# Patient Record
Sex: Male | Born: 1950
Health system: Southern US, Community
[De-identification: ages and names within clinical notes are randomized; demographics above are authoritative.]

## PROBLEM LIST (undated history)

## (undated) DIAGNOSIS — C7931 Secondary malignant neoplasm of brain: Secondary | ICD-10-CM

## (undated) DIAGNOSIS — K219 Gastro-esophageal reflux disease without esophagitis: Secondary | ICD-10-CM

## (undated) DIAGNOSIS — J189 Pneumonia, unspecified organism: Secondary | ICD-10-CM

## (undated) DIAGNOSIS — C642 Malignant neoplasm of left kidney, except renal pelvis: Secondary | ICD-10-CM

## (undated) HISTORY — PX: INGUINAL HERNIA REPAIR: SUR1180

## (undated) HISTORY — PX: COLONOSCOPY: SHX174

---

## 2008-02-03 HISTORY — PX: FOOT TENDON SURGERY: SHX958

## 2009-01-11 ENCOUNTER — Ambulatory Visit (HOSPITAL_COMMUNITY): Admission: RE | Admit: 2009-01-11 | Discharge: 2009-01-11 | Payer: Self-pay | Admitting: Orthopedic Surgery

## 2010-05-06 LAB — COMPREHENSIVE METABOLIC PANEL
Alkaline Phosphatase: 58 U/L (ref 39–117)
BUN: 22 mg/dL (ref 6–23)
CO2: 31 mEq/L (ref 19–32)
Chloride: 102 mEq/L (ref 96–112)
Glucose, Bld: 98 mg/dL (ref 70–99)
Potassium: 4.2 mEq/L (ref 3.5–5.1)
Total Bilirubin: 0.6 mg/dL (ref 0.3–1.2)

## 2010-05-06 LAB — CBC
Hemoglobin: 13.8 g/dL (ref 13.0–17.0)
RBC: 4.4 MIL/uL (ref 4.22–5.81)
WBC: 7.8 10*3/uL (ref 4.0–10.5)

## 2010-05-06 LAB — PROTIME-INR: Prothrombin Time: 13.1 seconds (ref 11.6–15.2)

## 2012-12-26 ENCOUNTER — Other Ambulatory Visit: Payer: Self-pay | Admitting: Family Medicine

## 2012-12-26 ENCOUNTER — Ambulatory Visit
Admission: RE | Admit: 2012-12-26 | Discharge: 2012-12-26 | Disposition: A | Discharge status: Home / Self Care | Payer: Managed Care, Other (non HMO) | Source: Ambulatory Visit | Attending: Family Medicine | Admitting: Family Medicine

## 2012-12-26 DIAGNOSIS — M25522 Pain in left elbow: Secondary | ICD-10-CM

## 2014-04-03 HISTORY — PX: KIDNEY SURGERY: SHX687

## 2014-04-20 ENCOUNTER — Other Ambulatory Visit: Payer: Self-pay | Admitting: Gastroenterology

## 2014-04-20 DIAGNOSIS — D509 Iron deficiency anemia, unspecified: Secondary | ICD-10-CM

## 2014-04-20 DIAGNOSIS — R109 Unspecified abdominal pain: Secondary | ICD-10-CM

## 2014-04-23 ENCOUNTER — Ambulatory Visit
Admission: RE | Admit: 2014-04-23 | Discharge: 2014-04-23 | Disposition: A | Discharge status: Home / Self Care | Payer: Managed Care, Other (non HMO) | Source: Ambulatory Visit | Attending: Gastroenterology | Admitting: Gastroenterology

## 2014-04-23 DIAGNOSIS — D509 Iron deficiency anemia, unspecified: Secondary | ICD-10-CM

## 2014-04-23 DIAGNOSIS — R109 Unspecified abdominal pain: Secondary | ICD-10-CM

## 2014-04-23 MED ORDER — IOPAMIDOL (ISOVUE-300) INJECTION 61%
100.0000 mL | Freq: Once | INTRAVENOUS | Status: AC | PRN
  Administered 2014-04-23: 100 mL via INTRAVENOUS

## 2014-05-01 ENCOUNTER — Other Ambulatory Visit: Payer: Self-pay | Admitting: Radiology

## 2014-05-01 ENCOUNTER — Telehealth: Payer: Self-pay | Admitting: Oncology

## 2014-05-01 ENCOUNTER — Other Ambulatory Visit: Payer: Self-pay | Admitting: Urology

## 2014-05-01 DIAGNOSIS — N2889 Other specified disorders of kidney and ureter: Secondary | ICD-10-CM

## 2014-05-01 NOTE — Telephone Encounter (Signed)
called pt and scheduled appt with Dr. Alen Blew. TG  Shadad 05/11/14 1:30pm  Dx: Renal Neoplasm Referring:  Dr. Jeffie Pollock

## 2014-05-03 ENCOUNTER — Other Ambulatory Visit: Payer: Self-pay | Admitting: Radiology

## 2014-05-04 ENCOUNTER — Encounter (HOSPITAL_COMMUNITY): Payer: Self-pay

## 2014-05-04 ENCOUNTER — Ambulatory Visit (HOSPITAL_COMMUNITY)
Admission: RE | Admit: 2014-05-04 | Discharge: 2014-05-04 | Disposition: A | Discharge status: Home / Self Care | Payer: Managed Care, Other (non HMO) | Source: Ambulatory Visit | Attending: Urology | Admitting: Urology

## 2014-05-04 DIAGNOSIS — N2889 Other specified disorders of kidney and ureter: Secondary | ICD-10-CM

## 2014-05-04 LAB — CBC
HEMATOCRIT: 31.9 % — AB (ref 39.0–52.0)
HEMOGLOBIN: 9.4 g/dL — AB (ref 13.0–17.0)
MCH: 21.2 pg — ABNORMAL LOW (ref 26.0–34.0)
MCHC: 29.5 g/dL — ABNORMAL LOW (ref 30.0–36.0)
MCV: 71.8 fL — ABNORMAL LOW (ref 78.0–100.0)
Platelets: 299 10*3/uL (ref 150–400)
RBC: 4.44 MIL/uL (ref 4.22–5.81)
RDW: 18.2 % — ABNORMAL HIGH (ref 11.5–15.5)
WBC: 5.9 10*3/uL (ref 4.0–10.5)

## 2014-05-04 LAB — APTT: APTT: 38 s — AB (ref 24–37)

## 2014-05-04 LAB — PROTIME-INR
INR: 1.16 (ref 0.00–1.49)
PROTHROMBIN TIME: 14.9 s (ref 11.6–15.2)

## 2014-05-04 MED ORDER — LIDOCAINE HCL (PF) 1 % IJ SOLN
INTRAMUSCULAR | Status: AC
  Filled 2014-05-04: qty 10

## 2014-05-04 MED ORDER — GELATIN ABSORBABLE 12-7 MM EX MISC
CUTANEOUS | Status: AC
  Filled 2014-05-04: qty 1

## 2014-05-04 MED ORDER — FENTANYL CITRATE 0.05 MG/ML IJ SOLN
INTRAMUSCULAR | Status: AC
  Filled 2014-05-04: qty 2

## 2014-05-04 MED ORDER — FENTANYL CITRATE 0.05 MG/ML IJ SOLN
INTRAMUSCULAR | Status: AC | PRN
  Administered 2014-05-04 (×2): 50 ug via INTRAVENOUS

## 2014-05-04 MED ORDER — MIDAZOLAM HCL 2 MG/2ML IJ SOLN
INTRAMUSCULAR | Status: AC
  Filled 2014-05-04: qty 4

## 2014-05-04 MED ORDER — SODIUM CHLORIDE 0.9 % IV SOLN
Freq: Once | INTRAVENOUS | Status: AC
  Administered 2014-05-04: 09:00:00 via INTRAVENOUS

## 2014-05-04 MED ORDER — MIDAZOLAM HCL 2 MG/2ML IJ SOLN
INTRAMUSCULAR | Status: AC | PRN
  Administered 2014-05-04 (×2): 1 mg via INTRAVENOUS

## 2014-05-04 NOTE — H&P (Signed)
Chief Complaint: L renal mass  Referring Physician(s): Wrenn,John  History of Present Illness: Jacob Beck is a 64 y.o. male   Pt has had groin pain x 1 mo CT reveals L renal mass; retroperitoneal lymphadenopathy and B lung nodules Now scheduled for L renal mass biopsy  Past Medical History  Diagnosis Date  . Renal mass     History reviewed. No pertinent past surgical history.  Allergies: Review of patient's allergies indicates no known allergies.  Medications: Prior to Admission medications   Medication Sig Start Date End Date Taking? Authorizing Provider  Fiber, Guar Gum, CHEW Chew 1 tablet by mouth daily.   Yes Historical Provider, MD  Multiple Vitamins-Minerals (MULTIVITAMIN WITH MINERALS) tablet Take 1 tablet by mouth daily.   Yes Historical Provider, MD     History reviewed. No pertinent family history.  History   Social History  . Marital Status: Married    Spouse Name: N/A  . Number of Children: N/A  . Years of Education: N/A   Social History Main Topics  . Smoking status: Former Research scientist (life sciences)  . Smokeless tobacco: Not on file  . Alcohol Use: Not on file  . Drug Use: Not on file  . Sexual Activity: Not on file   Other Topics Concern  . None   Social History Narrative  . None    Review of Systems: A 12 point ROS discussed and pertinent positives are indicated in the HPI above.  All other systems are negative.  Review of Systems  Constitutional: Positive for appetite change and unexpected weight change. Negative for fever and activity change.  Respiratory: Negative for cough, chest tightness and shortness of breath.   Cardiovascular: Negative for chest pain.  Gastrointestinal: Positive for abdominal pain.  Musculoskeletal: Negative for back pain.  Neurological: Negative for weakness.  Psychiatric/Behavioral: Negative for behavioral problems and confusion.    Vital Signs: BP 138/70 mmHg  Pulse 95  Temp(Src) 97.6 F (36.4 C) (Oral)  Resp  18  Ht 6\' 2"  (1.88 m)  Wt 79.833 kg (176 lb)  BMI 22.59 kg/m2  SpO2 99%  Physical Exam  Constitutional: He is oriented to person, place, and time. He appears well-developed.  Cardiovascular: Normal rate, regular rhythm and normal heart sounds.   No murmur heard. Pulmonary/Chest: Effort normal and breath sounds normal. He has no wheezes.  Abdominal: Soft. Bowel sounds are normal. There is no tenderness.  Musculoskeletal: Normal range of motion.  Neurological: He is alert and oriented to person, place, and time.  Skin: Skin is warm and dry.  Psychiatric: He has a normal mood and affect. His behavior is normal. Judgment and thought content normal.  Nursing note and vitals reviewed.   Mallampati Score:  MD Evaluation Airway: WNL Heart: WNL Abdomen: WNL Chest/ Lungs: WNL ASA  Classification: 3 Mallampati/Airway Score: One  Imaging: Ct Abdomen Pelvis W Contrast  04/23/2014   CLINICAL DATA:  64 year old male with generalized abdominal pain for the past 3 weeks, presenting with anemia.  EXAM: CT ABDOMEN AND PELVIS WITH CONTRAST  TECHNIQUE: Multidetector CT imaging of the abdomen and pelvis was performed using the standard protocol following bolus administration of intravenous contrast.  CONTRAST:  100 mL of Isovue 300.  COMPARISON:  No priors.  FINDINGS: Lower chest: Small pulmonary nodules in the lung bases bilaterally, largest of which measures 6 mm in the left lower lobe (image 4 of series 4).  Hepatobiliary: No cystic or solid hepatic lesions. No intra or extrahepatic biliary ductal  dilatation. Gallbladder is normal in appearance.  Pancreas: Unremarkable.  Spleen: The spleen itself is normal in appearance. There is obliteration of the fat plane between the spleen and the large mass extending off the upper pole of the left kidney, without definite direct invasion into the spleen.  Adrenals/Urinary Tract: Very large mass extending off the upper pole of the left kidney estimated to measure  approximately 15.0 x 14.1 x 9.9 cm. This completely obscures the left adrenal gland, and appears to protrude outside Gerota's fascia occupying much of the upper left retroperitoneum, coming in direct contact with the spleen and the medial/undersurface of the stomach, without definite gastric invasion. There is extensive neovascularity associated with this lesion, with multiple venous collaterals extending to the IVC. Additionally, there appears to be tumor thrombus in the proximal aspect of the left renal vein, best appreciated on image 35 of series 3 and coronal image 54 of series 601. This tumor thrombus does not appear to cross the midline, and the IVC is free of thrombus. Right kidney and right adrenal gland are normal in appearance. No hydroureteronephrosis. Urinary bladder is normal in appearance.  Stomach/Bowel: Extensive mass effect upon the proximal stomach from the large left renal mass. The stomach itself is otherwise grossly normal in appearance, although there is loss of the intervening fat plane between the mass and the stomach such that some early invasion of the stomach is not entirely excluded. No pathologic dilatation of small bowel or colon.  Vascular/Lymphatic: Mild atherosclerosis throughout the abdominal and pelvic vasculature. Extensive neovascularity throughout the left retroperitoneum, as above. Multiple borderline enlarged and mildly enlarged retroperitoneal lymph nodes, most pronounced in the left para-aortic nodal stations, measuring up to 16 mm in short axis.  Reproductive: Prostate gland and seminal vesicles are unremarkable in appearance.  Other: No significant volume of ascites.  No pneumoperitoneum.  Musculoskeletal: There are no aggressive appearing lytic or blastic lesions noted in the visualized portions of the skeleton.  IMPRESSION: 1. 15.0 x 14.1 x 9.9 cm left renal mass extending exophytically off the upper pole of the left kidney, with extension through Gerota's fascia, early  proximal left renal vein invasion, retroperitoneal lymphadenopathy, and multiple small pulmonary nodules in the lung bases bilaterally, concerning for probable stage IV renal cell carcinoma. 2. Additional incidental findings, as above. These results were called by telephone at the time of interpretation on 04/23/2014 at 4:37 pm to Dr. Clarene Essex, who verbally acknowledged these results.   Electronically Signed   By: Vinnie Langton M.D.   On: 04/23/2014 16:38    Labs:  CBC: No results for input(s): WBC, HGB, HCT, PLT in the last 8760 hours.  COAGS: No results for input(s): INR, APTT in the last 8760 hours.  BMP: No results for input(s): NA, K, CL, CO2, GLUCOSE, BUN, CALCIUM, CREATININE, GFRNONAA, GFRAA in the last 8760 hours.  Invalid input(s): CMP  LIVER FUNCTION TESTS: No results for input(s): BILITOT, AST, ALT, ALKPHOS, PROT, ALBUMIN in the last 8760 hours.  TUMOR MARKERS: No results for input(s): AFPTM, CEA, CA199, CHROMGRNA in the last 8760 hours.  Assessment and Plan:  L renal mass B lung nodules and RP LAN Scheduled for biopsy of renal mass in IR Risks and Benefits discussed with the patient including, but not limited to bleeding, infection, damage to adjacent structures or low yield requiring additional tests. All of the patient's questions were answered, patient is agreeable to proceed. Consent signed and in chart.  Thank you for this interesting consult.  I greatly enjoyed meeting CRAIGE PATEL and look forward to participating in their care.  Signed: Pavielle Biggar A 05/04/2014, 9:27 AM   I spent a total of  20 Minutes   in face to face in clinical consultation, greater than 50% of which was counseling/coordinating care for L renal mass bx

## 2014-05-04 NOTE — Discharge Instructions (Signed)
Kidney Biopsy A biopsy is a test that involves collecting small pieces of tissue, usually with a needle. The tissue is then examined under a microscope. A kidney biopsy can help a health care provider make a diagnosis and determine the best course of treatment. Your health care provider may recommend a kidney biopsy if you have any of the following conditions:  Blood in your urine (hematuria).  Excessive protein in your urine (proteinuria).  Impaired kidney function that causes excessive waste products in your blood. A specialist will look at the kidney tissue samples to check for unusual deposits, scarring, or infecting organisms that would explain your condition. If you have a kidney transplant, a biopsy can also help explain why a transplanted kidney is not working properly. Talk with your health care provider about what information might be learned from the biopsy and the risks involved. This can help you make a decision about whether a biopsy is worthwhile in your case. LET [facility] CARE PROVIDER KNOW ABOUT:  Any allergies you have.  All medicines you are taking, including vitamins, herbs, eye drops, creams, and over-the-counter medicines.  Previous problems you or members of your family have had with the use of anesthetics.  Any blood disorders you have.  Previous surgeries you have had.  Medical conditions you have. RISKS AND COMPLICATIONS Generally, a kidney biopsy is a safe procedure. However, as with any procedure, complications can occur. Possible complications include:  Infection.  Bleeding. BEFORE THE PROCEDURE  Make sure you understand the need for a biopsy.  Do not eat or drink for 8 hours before the test or as directed by your health care provider.  You will need to give blood and urine samples before the biopsy. This is to make sure you do not have a condition where you should not have a biopsy. PROCEDURE Kidney biopsies are usually done in a hospital. During  the procedure, you may be fully awake with light sedation, or you may be asleep under general anesthesia. The entire procedure usually takes an hour.  You will lie on your stomach to position the kidneys near the surface of your back. If you have a transplanted kidney, you will lie on your back.  The health care provider will inject a local painkiller. For a through-the-skin (percutaneous) biopsy, the health care provider will use a locating needle and X-ray or ultrasound equipment to find the right spot.  A collecting needle will be used to gather the tissue. If you are awake, you will be asked to hold your breath as the needle is inserted and collects the tissue. Each insertion and collection lasts about 30 seconds or a little longer. You will be told when to exhale. AFTER THE PROCEDURE  You will lie on your back for 12 to 24 hours. If you have a transplanted kidney, you may not have to lie on your back. During this time, your back will probably feel sore. You may stay in the hospital overnight after the procedure so that staff can check your condition.  You may notice some blood in your urine for 24 hours after the test. To detect any problems, your health care providers will:  Monitor your blood pressure and pulse.  Take blood samples to measure the amount of red blood cells.  Examine the urine that you pass.  On rare occasions when bleeding is excessive, it may be necessary to replace lost blood with a transfusion.  It is your responsibility to obtain your test results.  Ask the lab or department performing the test when and how you will get your results. FOR MORE INFORMATION  American [employer]: https://mathis.com/  National Kidney Foundation: www.kidney.org  National Kidney and Urologic Diseases Information Clearinghouse: http://kidney.AmenCredit.is Document Released: 11/30/2003 Document Revised: 11/09/2012 Document Reviewed: 07/25/2012 [facility] Patient Information 2015 [place],  [place]. This information is not intended to replace advice given to you by your health care provider. Make sure you discuss any questions you have with your health care provider.

## 2014-05-04 NOTE — Procedures (Signed)
L renal mass Bx 18 g core times three No comp

## 2014-05-04 NOTE — Sedation Documentation (Signed)
Bandaid L back area

## 2014-05-11 ENCOUNTER — Other Ambulatory Visit: Payer: Managed Care, Other (non HMO)

## 2014-05-11 ENCOUNTER — Ambulatory Visit: Payer: Managed Care, Other (non HMO)

## 2014-05-11 ENCOUNTER — Telehealth: Payer: Self-pay | Admitting: *Deleted

## 2014-05-11 ENCOUNTER — Ambulatory Visit (HOSPITAL_BASED_OUTPATIENT_CLINIC_OR_DEPARTMENT_OTHER): Payer: Managed Care, Other (non HMO) | Admitting: Oncology

## 2014-05-11 ENCOUNTER — Encounter: Payer: Self-pay | Admitting: Oncology

## 2014-05-11 VITALS — BP 137/62 | HR 94 | Temp 97.6°F | Resp 18 | Ht 74.0 in | Wt 176.2 lb

## 2014-05-11 DIAGNOSIS — D649 Anemia, unspecified: Secondary | ICD-10-CM | POA: Diagnosis not present

## 2014-05-11 DIAGNOSIS — R1013 Epigastric pain: Secondary | ICD-10-CM | POA: Diagnosis not present

## 2014-05-11 DIAGNOSIS — N2889 Other specified disorders of kidney and ureter: Secondary | ICD-10-CM

## 2014-05-11 DIAGNOSIS — C642 Malignant neoplasm of left kidney, except renal pelvis: Secondary | ICD-10-CM

## 2014-05-11 DIAGNOSIS — R1084 Generalized abdominal pain: Secondary | ICD-10-CM | POA: Diagnosis not present

## 2014-05-11 MED ORDER — HYDROCODONE-ACETAMINOPHEN 5-325 MG PO TABS: 1.0000 | ORAL_TABLET | Freq: Four times a day (QID) | ORAL | Status: DC | PRN

## 2014-05-11 NOTE — Telephone Encounter (Signed)
PT.'S WIFE RETURNED CALL. INFORMATION CONCERNING THE REFERRAL TO [facility] WAS GIVEN TO PT.'S WIFE. SHE WAS VERY CONCERNED THAT PT.S APPOINTMENT WAS NOT UNTIL MAY. SHE ASKED TO SEE IF THE APPOINTMENT COULD BE EARLIER. THIS NOTE WAS ROUTED TO STACEY CAMP,RN.

## 2014-05-11 NOTE — Telephone Encounter (Signed)
Per Dr. Alen Blew, this office made a referral to Dr. Vito Berger at [facility]. Phone # is [phone]). Appointment was made for May 4th, 2016 @ 1:30 pm. [facility] to mail him a new patient packet. Patient's cell phone is not set up to receive voice mails so, patient is not aware of this appointment. If he needs to cancel this appointment, he needs to call [phone] and reschedule. [facility] will fax all records to [phone]. Imaging studies will need to be put on a disk and Fed Ex. Also, need actual slides from the biopsy. This will be Fed Ex'd also. Medical Records is taking care of this.

## 2014-05-11 NOTE — Consult Note (Signed)
Reason for Referral: Renal cell carcinoma.   HPI: 64 year old gentleman currently of [place] where he lived the majority of his life. He is a rather healthy gentleman without any significant comorbid conditions and doesn't really take any routine medications. 3 weeks ago he started developing left-sided flank pain, midepigastric abdominal pain and suprapubic pain. Previously she with nausea but really no vomiting. He also reported close to 20 pound weight loss. His pain is rather persistent and a nagging and radiating into the groin. He subsequently underwent a CT scan on 04/23/2014 which revealed a 15 cm mass arising from the upper pole of his left kidney extending into the retroperitoneum. This is associated with adenopathy and the proximal vein thrombosis. He was evaluated by Dr. Jeffie Pollock and referred her for a biopsy that was done on 05/04/2014. 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 CT scan of the chest showed a few pulmonary nodule but predominantly subcentimeter nodules. There is no lymphadenopathy noted in the chest. Patient referred to me for evaluation regarding these findings. He is a very functional individual and have been working up to this point and currently on short-term disability. He is able to drive and attends to activities of daily living.  Clinically, he is symptomatic from this mass. He does continue to report abdominal pain occasional nausea but no vomiting. He has had also some fevers and chills at nighttime. He is appetite is poor but is able to eat and keep food down. He does not report any headaches, blurry vision, double vision, syncope or seizures. He does not report any sweats and does endorse weight loss and appetite changes. He does not report any chest pain, palpitation, orthopnea, leg edema. He does not report any cough, hemoptysis, hematemesis. He does not report any early satiety or abdominal distention. He  does report flank pain but no hematuria, dysuria, frequency, urgency or hesitancy. He does not report any skeletal pains to his back pain shoulder pain or hip pain. He does not report any lymphadenopathy or petechiae. Remainder of review of systems unremarkable.   Past Medical History  Diagnosis Date  . Renal mass   :  No past surgical history on file.:   Current outpatient prescriptions:  .  Fiber, Guar Gum, CHEW, Chew 1 tablet by mouth daily., Disp: , Rfl:  .  ibuprofen (ADVIL,MOTRIN) 200 MG tablet, Take 200 mg by mouth at bedtime., Disp: , Rfl:  .  Multiple Vitamins-Minerals (MULTIVITAMIN WITH MINERALS) tablet, Take 1 tablet by mouth daily., Disp: , Rfl:  .  HYDROcodone-acetaminophen (NORCO/VICODIN) 5-325 MG per tablet, Take 1 tablet by mouth every 6 (six) hours as needed for moderate pain., Disp: 30 tablet, Rfl: 0:  No Known Allergies:  No family history on file.:  History   Social History  . Marital Status: Married    Spouse Name: N/A  . Number of Children: N/A  . Years of Education: N/A   Occupational History  . Not on file.   Social History Main Topics  . Smoking status: Former Research scientist (life sciences)  . Smokeless tobacco: Not on file  . Alcohol Use: Not on file  . Drug Use: Not on file  . Sexual Activity: Not on file   Other Topics Concern  . Not on file   Social History Narrative  . No narrative on file  :  Pertinent items are noted in HPI.  Exam: ECOG 0 Blood pressure 137/62, pulse 94, temperature 97.6 F (36.4 C), temperature  source Oral, resp. rate 18, height 6\' 2"  (1.88 m), weight 176 lb 3.2 oz (79.924 kg), SpO2 100 %. General appearance: alert and cooperative Head: Normocephalic, without obvious abnormality Throat: lips, mucosa, and tongue normal; teeth and gums normal Neck: no adenopathy Back: negative Resp: clear to auscultation bilaterally Chest wall: no tenderness Cardio: regular rate and rhythm, S1, S2 normal, no murmur, click, rub or gallop GI: soft,  non-tender; bowel sounds normal; no masses,  no organomegaly Extremities: extremities normal, atraumatic, no cyanosis or edema Pulses: 2+ and symmetric Skin: Skin color, texture, turgor normal. No rashes or lesions Lymph nodes: Cervical, supraclavicular, and axillary nodes normal.    Ct Abdomen Pelvis W Contrast  04/23/2014   CLINICAL DATA:  64 year old male with generalized abdominal pain for the past 3 weeks, presenting with anemia.  EXAM: CT ABDOMEN AND PELVIS WITH CONTRAST  TECHNIQUE: Multidetector CT imaging of the abdomen and pelvis was performed using the standard protocol following bolus administration of intravenous contrast.  CONTRAST:  100 mL of Isovue 300.  COMPARISON:  No priors.  FINDINGS: Lower chest: Small pulmonary nodules in the lung bases bilaterally, largest of which measures 6 mm in the left lower lobe (image 4 of series 4).  Hepatobiliary: No cystic or solid hepatic lesions. No intra or extrahepatic biliary ductal dilatation. Gallbladder is normal in appearance.  Pancreas: Unremarkable.  Spleen: The spleen itself is normal in appearance. There is obliteration of the fat plane between the spleen and the large mass extending off the upper pole of the left kidney, without definite direct invasion into the spleen.  Adrenals/Urinary Tract: Very large mass extending off the upper pole of the left kidney estimated to measure approximately 15.0 x 14.1 x 9.9 cm. This completely obscures the left adrenal gland, and appears to protrude outside Gerota's fascia occupying much of the upper left retroperitoneum, coming in direct contact with the spleen and the medial/undersurface of the stomach, without definite gastric invasion. There is extensive neovascularity associated with this lesion, with multiple venous collaterals extending to the IVC. Additionally, there appears to be tumor thrombus in the proximal aspect of the left renal vein, best appreciated on image 35 of series 3 and coronal image 54  of series 601. This tumor thrombus does not appear to cross the midline, and the IVC is free of thrombus. Right kidney and right adrenal gland are normal in appearance. No hydroureteronephrosis. Urinary bladder is normal in appearance.  Stomach/Bowel: Extensive mass effect upon the proximal stomach from the large left renal mass. The stomach itself is otherwise grossly normal in appearance, although there is loss of the intervening fat plane between the mass and the stomach such that some early invasion of the stomach is not entirely excluded. No pathologic dilatation of small bowel or colon.  Vascular/Lymphatic: Mild atherosclerosis throughout the abdominal and pelvic vasculature. Extensive neovascularity throughout the left retroperitoneum, as above. Multiple borderline enlarged and mildly enlarged retroperitoneal lymph nodes, most pronounced in the left para-aortic nodal stations, measuring up to 16 mm in short axis.  Reproductive: Prostate gland and seminal vesicles are unremarkable in appearance.  Other: No significant volume of ascites.  No pneumoperitoneum.  Musculoskeletal: There are no aggressive appearing lytic or blastic lesions noted in the visualized portions of the skeleton.  IMPRESSION: 1. 15.0 x 14.1 x 9.9 cm left renal mass extending exophytically off the upper pole of the left kidney, with extension through Gerota's fascia, early proximal left renal vein invasion, retroperitoneal lymphadenopathy, and multiple small pulmonary nodules  in the lung bases bilaterally, concerning for probable stage IV renal cell carcinoma. 2. Additional incidental findings, as above. These results were called by telephone at the time of interpretation on 04/23/2014 at 4:37 pm to Dr. Clarene Essex, who verbally acknowledged these results.   Electronically Signed   By: Vinnie Langton M.D.   On: 04/23/2014 16:38      Assessment and Plan:    64 year old gentleman with the following issues:  1. Renal cell carcinoma  presented with a large mass measuring 15.0 x 14.1 x 9.9 cm left renal mass extending off the upper pole of the left kidney into the retroperitoneum. He has also evidence of lymphadenopathy and small pulmonary nodules. He presented with abdominal pain and 20 pound weight loss. This is a biopsy proven to be renal cell carcinoma with a biopsy done on 05/04/2014.  The natural course of this disease was discussed extensively with the patient and his wife. Options of treatments were also reviewed. These options would include a neoadjuvant systemic therapy in the form of oral VGEF inhibitors, mTOR inhibitors or immunotherapy. The idea is to treat him systemically in attempt to shrink his primary tumor and if he has an excellent response, a salvage nephrectomy will be attempted later.  Alternatively, and I plan to nephrectomy was also considered. It certainly would be a major operation especially with left renal vein invasion as well as the large in nature of his mass.  Both approaches were discussed extensively and risks and benefits of systemic therapy were reviewed. I fear that if we start with systemic therapy and he has a poor response, the window of opportunity to remove this tumor might close rather rapidly. I also feel that the majority of his symptoms are arising from this tumor and removing this tumor as soon as possible might palliate his symptoms and improve his chances moving forward.  After discussing these both approaches he elected to proceed with surgical approach. He has expressed interest in receiving a surgical opinion at a tertiary care center and I'll make the appropriate referral for him based on his wishes.  2. Anemia: His hemoglobin is 9.4 MCV of 71.8 and an RDW of 18.2. His ferritin however is 324 which is within normal range. He could have an element of microcytosis related to paraneoplastic syndrome could also be related to iron loss from his tumor. For the time being he does not  require any treatment and will continue to monitor this moving forward.  3. Abdominal pain: Prescription for Vicodin was given to the patient with instructions to use it.  Follow-up will be determined by the next course of action. It will be after surgery if he elected to proceed with that sooner if he wanted to proceed with systemic therapy.

## 2014-05-11 NOTE — Progress Notes (Signed)
Please see consult note.  

## 2014-05-11 NOTE — Progress Notes (Signed)
Checked in new pt with no financial concerns at this time.  Pt has my card for any billing questions or concerns. °

## 2014-05-16 ENCOUNTER — Encounter: Payer: Self-pay | Admitting: *Deleted

## 2014-05-16 NOTE — Progress Notes (Signed)
Wife called, very upset. They decided to move up referral appt originally scheduled for may 4th with dr Vito Berger at [facility],  to today. none of the records are there, no scans, no path slides. Spoke with tiffany in medical records, she will fax chart now. Dr Alen Blew notified.

## 2014-05-17 ENCOUNTER — Encounter: Payer: Self-pay | Admitting: *Deleted

## 2014-05-21 ENCOUNTER — Telehealth: Payer: Self-pay | Admitting: *Deleted

## 2014-05-21 ENCOUNTER — Other Ambulatory Visit: Payer: Self-pay | Admitting: *Deleted

## 2014-05-21 DIAGNOSIS — N2889 Other specified disorders of kidney and ureter: Secondary | ICD-10-CM

## 2014-05-21 MED ORDER — HYDROCODONE-ACETAMINOPHEN 5-325 MG PO TABS: 1.0000 | ORAL_TABLET | Freq: Four times a day (QID) | ORAL | Status: DC | PRN

## 2014-05-21 NOTE — Telephone Encounter (Signed)
TC from patient requesting refill on Hydrocodone /apap 5/325 from [facility]. Last filled 05/11/14 with #30

## 2014-05-22 ENCOUNTER — Encounter: Payer: Self-pay | Admitting: *Deleted

## 2014-05-22 NOTE — Progress Notes (Signed)
[place] Social Work  Clinical Social Work was referred by  Patient's spouse to review and complete healthcare advance directives.  Clinical Social Worker met with patient and spouse in [place] office.  The patient designated spouse as their primary healthcare agent and no secondary agent.  Patient also completed healthcare living will.    Clinical Social Worker notarized documents and made copies for patient/family. Clinical Social Worker will send documents to medical records to be scanned into patient's chart. Clinical Social Worker encouraged patient/family to contact with any additional questions or concerns.  Polo Riley, MSW, [place] Worker [facility] [phone]

## 2014-05-22 NOTE — Telephone Encounter (Signed)
[facility] called requesting diagnosis code to fill prescription for this patient.  N28.99 given for Left renal mass.

## 2014-05-29 ENCOUNTER — Other Ambulatory Visit: Payer: Self-pay | Admitting: *Deleted

## 2014-05-29 MED ORDER — HYDROCODONE-ACETAMINOPHEN 5-325 MG PO TABS: 1.0000 | ORAL_TABLET | Freq: Four times a day (QID) | ORAL | Status: DC | PRN

## 2014-05-30 ENCOUNTER — Telehealth: Payer: Self-pay | Admitting: *Deleted

## 2014-05-30 NOTE — Telephone Encounter (Signed)
TC from [facility] @ [place] requesting diagnostic code in order to fill hydrocodone.  Provide this code to pharmacy.

## 2014-05-30 NOTE — Telephone Encounter (Signed)
TC from patient checking on status of hydrocodone refill. Informed patient it is ready for him to pick up.  He voiced understanding.

## 2014-06-04 ENCOUNTER — Telehealth: Payer: Self-pay | Admitting: *Deleted

## 2014-06-04 NOTE — Telephone Encounter (Signed)
Jacob Beck, a urology oncologist from [facility] left a voice mail stating, " please tell Jacob Beck that Jacob Beck has a large, renal mass. I tried to resect it, but unfortunately it was not resectable. Surgery was on Friday and he was discharged Monday. Patient to follow up with Jacob Beck. Have Jacob Beck call me if has any further questions. This message to be given to MD.

## 2014-06-05 ENCOUNTER — Other Ambulatory Visit: Payer: Self-pay | Admitting: Oncology

## 2014-06-05 ENCOUNTER — Telehealth: Payer: Self-pay | Admitting: Oncology

## 2014-06-05 ENCOUNTER — Telehealth: Payer: Self-pay | Admitting: *Deleted

## 2014-06-05 NOTE — Telephone Encounter (Signed)
S/w pt's wife confirming MD visit for 05/05 per Dr. Alen Blew and ok to DB pt per 05/03 POF.... Cherylann Banas

## 2014-06-05 NOTE — Telephone Encounter (Signed)
Wife called- states he is having a problem with med, not eating, not going to [facility].  Returned call- Wife states patient was hallucinating from oxycodone- They called [facility]-changed back to hydrocodone.   Needs refill of  Hydrocodone.  Has not had BM in 4-5 days. (discharged Monday) Currently uses stool softener. Instructed patient to get miralax and take as directed. Is not able to eat.   Wants to know about follow up appt  Will forward to Dr Alen Blew for further instructions.

## 2014-06-07 ENCOUNTER — Telehealth: Payer: Self-pay | Admitting: *Deleted

## 2014-06-07 ENCOUNTER — Encounter: Payer: Self-pay | Admitting: Oncology

## 2014-06-07 ENCOUNTER — Ambulatory Visit (HOSPITAL_BASED_OUTPATIENT_CLINIC_OR_DEPARTMENT_OTHER): Payer: Managed Care, Other (non HMO) | Admitting: Oncology

## 2014-06-07 ENCOUNTER — Telehealth: Payer: Self-pay | Admitting: Oncology

## 2014-06-07 VITALS — BP 150/67 | HR 101 | Temp 97.8°F | Resp 17 | Ht 74.0 in | Wt 171.5 lb

## 2014-06-07 DIAGNOSIS — C642 Malignant neoplasm of left kidney, except renal pelvis: Secondary | ICD-10-CM

## 2014-06-07 DIAGNOSIS — K5900 Constipation, unspecified: Secondary | ICD-10-CM | POA: Diagnosis not present

## 2014-06-07 DIAGNOSIS — N2889 Other specified disorders of kidney and ureter: Secondary | ICD-10-CM

## 2014-06-07 DIAGNOSIS — R591 Generalized enlarged lymph nodes: Secondary | ICD-10-CM

## 2014-06-07 DIAGNOSIS — R109 Unspecified abdominal pain: Secondary | ICD-10-CM

## 2014-06-07 DIAGNOSIS — R911 Solitary pulmonary nodule: Secondary | ICD-10-CM

## 2014-06-07 MED ORDER — PAZOPANIB HCL 200 MG PO TABS: 800.0000 mg | ORAL_TABLET | Freq: Every day | ORAL | Status: DC

## 2014-06-07 NOTE — Progress Notes (Signed)
Hematology and Oncology Follow Up Visit  Jacob Beck [id number] 26-Mar-1950 64 y.o. 06/07/2014 12:19 PM   Principle Diagnosis: 64 year old gentleman with renal cell carcinoma diagnosed in April 2016. He presented with a large tumor on the left kidney measuring 15 cm and extending into the retroperitoneum causing proximal renal vein thrombosis.   Prior Therapy: Attempted surgical resection on 06/01/2014 that was unsuccessful given the size of tumor.  Current therapy: Under consideration for systemic therapy.  Interim History: Jacob Beck presents today for a follow-up visit. Since the last visit, he underwent attempted surgical resection at [facility] on 06/01/2014 and was unsuccessful due to the size of tumor. He tolerated the procedure well and have recovered well since that time. His incision is well-healed. He does report problem with constipation and occasional left upper quadrant pain. Is having some difficulty sleeping at times. Otherwise his mobility, performance status and appetite remain reasonable.   He does not report any headaches, blurry vision, double vision, syncope or seizures. He does not report any sweats or weight loss since the last visit. He does not report any chest pain, palpitation, orthopnea, leg edema. He does not report any cough, hemoptysis, hematemesis. He does not report any early satiety or abdominal distention. He does report flank pain but no hematuria, dysuria, frequency, urgency or hesitancy. He does not report any skeletal pains to his back pain shoulder pain or hip pain. He does not report any lymphadenopathy or petechiae. Remainder of review of systems unremarkable.   Medications: I have reviewed the patient's current medications.  Current Outpatient Prescriptions  Medication Sig Dispense Refill  . Fiber, Guar Gum, CHEW Chew 1 tablet by mouth daily.    Marland Kitchen HYDROcodone-acetaminophen (NORCO/VICODIN) 5-325 MG per tablet Take 1 tablet by  mouth every 6 (six) hours as needed for moderate pain. 30 tablet 0  . ibuprofen (ADVIL,MOTRIN) 200 MG tablet Take 200 mg by mouth at bedtime.    . Multiple Vitamins-Minerals (MULTIVITAMIN WITH MINERALS) tablet Take 1 tablet by mouth daily.    Marland Kitchen oxyCODONE (ROXICODONE) 15 MG immediate release tablet Take 15 mg by mouth.    . pazopanib (VOTRIENT) 200 MG tablet Take 4 tablets (800 mg total) by mouth daily. Take on an empty stomach. 120 tablet 0   No current facility-administered medications for this visit.     Allergies: No Known Allergies  Past Medical History, Surgical history, Social history, and Family History were reviewed and updated.   Physical Exam: Blood pressure 150/67, pulse 101, temperature 97.8 F (36.6 C), temperature source Oral, resp. rate 17, height 6\' 2"  (1.88 m), weight 171 lb 8 oz (77.792 kg), SpO2 98 %. ECOG: 1 General appearance: alert and cooperative Head: Normocephalic, without obvious abnormality Neck: no adenopathy Lymph nodes: Cervical, supraclavicular, and axillary nodes normal. Heart:regular rate and rhythm, S1, S2 normal, no murmur, click, rub or gallop Lung:chest clear, no wheezing, rales, normal symmetric air entry Abdomin: soft, non-tender, without masses or organomegaly EXT:no erythema, induration, or nodules   Lab Results: Lab Results  Component Value Date   WBC 5.9 05/04/2014   HGB 9.4* 05/04/2014   HCT 31.9* 05/04/2014   MCV 71.8* 05/04/2014   PLT 299 05/04/2014     Chemistry      Component Value Date/Time   NA 138 01/08/2009 1509   K 4.2 01/08/2009 1509   CL 102 01/08/2009 1509   CO2 31 01/08/2009 1509   BUN 22 01/08/2009 1509   CREATININE 0.95 01/08/2009 1509  Component Value Date/Time   CALCIUM 10.0 01/08/2009 1509   ALKPHOS 58 01/08/2009 1509   AST 25 01/08/2009 1509   ALT 20 01/08/2009 1509   BILITOT 0.6 01/08/2009 1509      Impression and Plan:   64 year old gentleman with the following issues:  1.Renal cell  carcinoma presented with a large mass measuring 15.0 x 14.1 x 9.9 cm left renal mass extending off the upper pole of the left kidney into the retroperitoneum. He has also evidence of lymphadenopathy and small pulmonary nodules. He presented with abdominal pain and 20 pound weight loss. This is a biopsy proven to be renal cell carcinoma with a biopsy done on 05/04/2014. Attempted surgical resection for debulking purposes was unsuccessful.  Systemic therapy was discussed today with the patient and felt that Votrient would be his best option at this time. Alternative options would include immunotherapy as well as systemic chemotherapy. Risks and benefits of all these approaches were discussed complications of Votrient were also reviewed extensively. These comp locations would include nausea, diarrhea, fatigue, hypertension, liver toxicity as well as change in the color of the hair. Written information was given to the patient. The goal of therapy would be palliative at this time and he understand this as well. The benefit would be reduction in the size of tumor and palliation of the symptoms associated with this cancer.  After discussing the risks and benefits is agreeable to proceed with this medication. I anticipate the start of therapy around May 13 which we'll give him about 2 weeks from his operation.  2. Constipation: I have given him instructions to combat that including increasing the fiber in his diet as well as hydration and using stool softeners. I did not suggest any strong laxative at this time given the complications associated with Votrient might be loose bowel habits.  3. Abdominal pain: Currently on oxycodone seems to be helping at this time.  4. Follow-up: Will be in one month to assess for any complications.   Zola Button, MD 5/5/201612:19 PM

## 2014-06-07 NOTE — Progress Notes (Signed)
I faxed [employer] [phone] for votrient asst.

## 2014-06-07 NOTE — Telephone Encounter (Signed)
Gave and printed appt sched and avs for pt for JUNE °

## 2014-06-07 NOTE — Telephone Encounter (Signed)
BIOLOGICS TO TRANSFER VOTRIENT PRESCRIPTION TO [facility] DUE TO PT.'S INSURANCE.

## 2014-06-13 ENCOUNTER — Telehealth: Payer: Self-pay | Admitting: *Deleted

## 2014-06-13 ENCOUNTER — Encounter: Payer: Self-pay | Admitting: Oncology

## 2014-06-13 ENCOUNTER — Other Ambulatory Visit: Payer: Self-pay | Admitting: *Deleted

## 2014-06-13 MED ORDER — HYDROCODONE-ACETAMINOPHEN 5-325 MG PO TABS: 2.0000 | ORAL_TABLET | ORAL | Status: DC | PRN

## 2014-06-13 NOTE — Telephone Encounter (Signed)
Wife sharon calling to say the oxycodone that patient was prescribed, while in the hospital, is too strong and makes patient very dizzy. Would like to go back to hydrocodone 5-325 and take 2 tablets every 4 hours prn pain. They need a new script.

## 2014-06-13 NOTE — Telephone Encounter (Signed)
Refill Script left at front for patient p/u, patient notified.

## 2014-06-13 NOTE — Progress Notes (Signed)
I placed prior auth req on desk of nurse of dr. Alen Blew.

## 2014-06-13 NOTE — Telephone Encounter (Signed)
Patient is experiencing dizziness and sleepiness with current oxycodone medication. Patient would like to be switched back to hydrocodone if possible. Message forwarded to MD [facility] and RN Dixie.

## 2014-06-14 ENCOUNTER — Encounter: Payer: Self-pay | Admitting: Oncology

## 2014-06-14 ENCOUNTER — Telehealth: Payer: Self-pay | Admitting: *Deleted

## 2014-06-14 NOTE — Progress Notes (Signed)
I placed restrictions form on the desk of nurse for dr. Alen Blew

## 2014-06-14 NOTE — Telephone Encounter (Signed)
TC to pt's wife that Votrient has been approved per "[employer]" at [employer] and will be ready for delivery this afternoon. Wife states she will call " [employer]" to confirm the particulars. She states she has the #.

## 2014-06-14 NOTE — Progress Notes (Signed)
I faxed [facility] form for votrient prior auth  [phone]

## 2014-06-14 NOTE — Telephone Encounter (Signed)
Loa Socks called in reference to [place].  Asked if "Votrient will be used as a monotherapy or in combination with something else".  Reviewed 06-07-2014 office note plan which indicates monotherapy.    "Votrient approved for 12 months pending quality check.  Patient should be able to fill by the end of the day today."

## 2014-06-15 ENCOUNTER — Encounter: Payer: Self-pay | Admitting: Oncology

## 2014-06-15 NOTE — Progress Notes (Signed)
I faxed form to [employer] [facility]  [phone]

## 2014-06-15 NOTE — Progress Notes (Signed)
Per [facility] votrient approved 06/14/14+06/14/15. I will send to medical records.

## 2014-06-18 ENCOUNTER — Other Ambulatory Visit: Payer: Self-pay | Admitting: *Deleted

## 2014-06-18 MED ORDER — HYDROCODONE-ACETAMINOPHEN 5-325 MG PO TABS: 2.0000 | ORAL_TABLET | ORAL | Status: DC | PRN

## 2014-06-18 NOTE — Telephone Encounter (Signed)
Wife sharon calling to request refill on patient's hydrocodone/apap 5/325. Signed script left at front for patient p/u. Wife notified.

## 2014-06-19 ENCOUNTER — Encounter: Payer: Self-pay | Admitting: Oncology

## 2014-06-19 NOTE — Progress Notes (Signed)
I called and spoke with wife to advise I faxed the notes to a[facility] [phone]

## 2014-06-26 ENCOUNTER — Other Ambulatory Visit: Payer: Self-pay | Admitting: *Deleted

## 2014-06-26 DIAGNOSIS — G8929 Other chronic pain: Secondary | ICD-10-CM

## 2014-06-26 MED ORDER — HYDROCODONE-ACETAMINOPHEN 5-325 MG PO TABS: 2.0000 | ORAL_TABLET | ORAL | Status: DC | PRN

## 2014-06-26 NOTE — Telephone Encounter (Signed)
Refill: Norco. Patient notified and verbalized understanding. Patient family member will pick up 06/27/14.

## 2014-07-05 ENCOUNTER — Ambulatory Visit (HOSPITAL_BASED_OUTPATIENT_CLINIC_OR_DEPARTMENT_OTHER): Payer: Managed Care, Other (non HMO) | Admitting: Oncology

## 2014-07-05 ENCOUNTER — Telehealth: Payer: Self-pay | Admitting: Oncology

## 2014-07-05 ENCOUNTER — Other Ambulatory Visit (HOSPITAL_BASED_OUTPATIENT_CLINIC_OR_DEPARTMENT_OTHER): Payer: Managed Care, Other (non HMO)

## 2014-07-05 VITALS — BP 146/77 | HR 72 | Temp 98.2°F | Resp 18 | Ht 74.0 in | Wt 159.3 lb

## 2014-07-05 DIAGNOSIS — C642 Malignant neoplasm of left kidney, except renal pelvis: Secondary | ICD-10-CM | POA: Diagnosis not present

## 2014-07-05 DIAGNOSIS — R109 Unspecified abdominal pain: Secondary | ICD-10-CM

## 2014-07-05 DIAGNOSIS — N2889 Other specified disorders of kidney and ureter: Secondary | ICD-10-CM

## 2014-07-05 DIAGNOSIS — R591 Generalized enlarged lymph nodes: Secondary | ICD-10-CM | POA: Diagnosis not present

## 2014-07-05 DIAGNOSIS — R918 Other nonspecific abnormal finding of lung field: Secondary | ICD-10-CM | POA: Diagnosis not present

## 2014-07-05 DIAGNOSIS — K5900 Constipation, unspecified: Secondary | ICD-10-CM | POA: Diagnosis not present

## 2014-07-05 DIAGNOSIS — C649 Malignant neoplasm of unspecified kidney, except renal pelvis: Secondary | ICD-10-CM

## 2014-07-05 DIAGNOSIS — G8929 Other chronic pain: Secondary | ICD-10-CM

## 2014-07-05 LAB — CBC WITH DIFFERENTIAL/PLATELET
BASO%: 0.5 % (ref 0.0–2.0)
BASOS ABS: 0 10*3/uL (ref 0.0–0.1)
EOS%: 0.7 % (ref 0.0–7.0)
Eosinophils Absolute: 0 10*3/uL (ref 0.0–0.5)
HCT: 36.5 % — ABNORMAL LOW (ref 38.4–49.9)
HGB: 11 g/dL — ABNORMAL LOW (ref 13.0–17.1)
LYMPH%: 20.5 % (ref 14.0–49.0)
MCH: 22 pg — AB (ref 27.2–33.4)
MCHC: 30.1 g/dL — ABNORMAL LOW (ref 32.0–36.0)
MCV: 73.1 fL — ABNORMAL LOW (ref 79.3–98.0)
MONO#: 0.4 10*3/uL (ref 0.1–0.9)
MONO%: 6.2 % (ref 0.0–14.0)
NEUT#: 4.1 10*3/uL (ref 1.5–6.5)
NEUT%: 72.1 % (ref 39.0–75.0)
Platelets: 258 10*3/uL (ref 140–400)
RBC: 4.99 10*6/uL (ref 4.20–5.82)
RDW: 19.4 % — ABNORMAL HIGH (ref 11.0–14.6)
WBC: 5.7 10*3/uL (ref 4.0–10.3)
lymph#: 1.2 10*3/uL (ref 0.9–3.3)

## 2014-07-05 LAB — COMPREHENSIVE METABOLIC PANEL (CC13)
ALK PHOS: 108 U/L (ref 40–150)
ALT: 13 U/L (ref 0–55)
AST: 24 U/L (ref 5–34)
Albumin: 2.1 g/dL — ABNORMAL LOW (ref 3.5–5.0)
Anion Gap: 11 mEq/L (ref 3–11)
BUN: 17.8 mg/dL (ref 7.0–26.0)
CHLORIDE: 98 meq/L (ref 98–109)
CO2: 28 meq/L (ref 22–29)
Calcium: 9.4 mg/dL (ref 8.4–10.4)
Creatinine: 0.9 mg/dL (ref 0.7–1.3)
EGFR: 89 mL/min/{1.73_m2} — AB (ref >90)
Glucose: 127 mg/dl (ref 70–140)
Potassium: 4.9 mEq/L (ref 3.5–5.1)
Sodium: 137 mEq/L (ref 136–145)
Total Bilirubin: 0.3 mg/dL (ref 0.20–1.20)
Total Protein: 7.6 g/dL (ref 6.4–8.3)

## 2014-07-05 MED ORDER — HYDROCODONE-ACETAMINOPHEN 5-325 MG PO TABS: 2.0000 | ORAL_TABLET | ORAL | Status: DC | PRN

## 2014-07-05 MED ORDER — FENTANYL 25 MCG/HR TD PT72: 25.0000 ug | MEDICATED_PATCH | TRANSDERMAL | Status: DC

## 2014-07-05 NOTE — Progress Notes (Signed)
Hematology and Oncology Follow Up Visit  Jacob Beck [id number] 1950-08-11 64 y.o. 07/05/2014 8:59 AM   Principle Diagnosis: 64 year old gentleman with renal cell carcinoma diagnosed in April 2016. He presented with a large tumor on the left kidney measuring 15 cm and extending into the retroperitoneum causing proximal renal vein thrombosis.   Prior Therapy: Attempted surgical resection on 06/01/2014 that was unsuccessful given the size of tumor.  Current therapy: Votrient 800 mg daily started on 06/07/2014.  Interim History: Jacob Beck presents today for a follow-up visit. Since the last visit, he started Votrient and had few complications. He initially reported diarrhea but subsequently his diarrhea subsided and became constipated. He has been using stool softeners in the last few days his bowel movements are normal. He reported grade 1 fatigue and grade 1 anorexia. He lost a few pounds since the last visit. He has not reported any other complications related to this medication.  His pain continues to be an issue and he has been using close to 8 tablets of the hydrocodone. He reports his pain is constant and associated with his tumor at the left upper quadrant of his abdomen. Is not associated with any nausea or vomiting. Senna associated with any hematochezia or melanoma. The pain wakes him up in the middle of the night at times.   He does not report any headaches, blurry vision, double vision, syncope or seizures. He does not report any sweats or weight loss since the last visit. He does not report any chest pain, palpitation, orthopnea, leg edema. He does not report any cough, hemoptysis, hematemesis. He does not report any early satiety or abdominal distention. He does report flank pain but no hematuria, dysuria, frequency, urgency or hesitancy. He does not report any skeletal pains to his back pain shoulder pain or hip pain. He does not report any lymphadenopathy or petechiae. Remainder of  review of systems unremarkable.   Medications: I have reviewed the patient's current medications.  Current Outpatient Prescriptions  Medication Sig Dispense Refill  . Fiber, Guar Gum, CHEW Chew 1 tablet by mouth daily.    Marland Kitchen HYDROcodone-acetaminophen (NORCO/VICODIN) 5-325 MG per tablet Take 2 tablets by mouth every 4 (four) hours as needed for moderate pain. 90 tablet 0  . ibuprofen (ADVIL,MOTRIN) 200 MG tablet Take 200 mg by mouth at bedtime.    . Multiple Vitamins-Minerals (MULTIVITAMIN WITH MINERALS) tablet Take 1 tablet by mouth daily.    . pazopanib (VOTRIENT) 200 MG tablet Take 4 tablets (800 mg total) by mouth daily. Take on an empty stomach. 120 tablet 0  . fentaNYL (DURAGESIC - DOSED MCG/HR) 25 MCG/HR patch Place 1 patch (25 mcg total) onto the skin every 3 (three) days. 10 patch 0   No current facility-administered medications for this visit.     Allergies: No Known Allergies  Past Medical History, Surgical history, Social history, and Family History were reviewed and updated.   Physical Exam: Blood pressure 146/77, pulse 72, temperature 98.2 F (36.8 C), temperature source Oral, resp. rate 18, height 6\' 2"  (1.88 m), weight 159 lb 4.8 oz (72.258 kg), SpO2 98 %. ECOG: 1 General appearance: alert and cooperative. Chronically ill-appearing but not in any distress today. Head: Normocephalic, without obvious abnormality Neck: no adenopathy Lymph nodes: Cervical, supraclavicular, and axillary nodes normal. Heart:regular rate and rhythm, S1, S2 normal, no murmur, click, rub or gallop Lung:chest clear, no wheezing, rales, normal symmetric air entry Abdomin: soft, non-tender, without masses or organomegaly EXT:no erythema, induration, or nodules  Lab Results: Lab Results  Component Value Date   WBC 5.7 07/05/2014   HGB 11.0* 07/05/2014   HCT 36.5* 07/05/2014   MCV 73.1* 07/05/2014   PLT 258 07/05/2014     Chemistry      Component Value Date/Time   NA 138 01/08/2009  1509   K 4.2 01/08/2009 1509   CL 102 01/08/2009 1509   CO2 31 01/08/2009 1509   BUN 22 01/08/2009 1509   CREATININE 0.95 01/08/2009 1509      Component Value Date/Time   CALCIUM 10.0 01/08/2009 1509   ALKPHOS 58 01/08/2009 1509   AST 25 01/08/2009 1509   ALT 20 01/08/2009 1509   BILITOT 0.6 01/08/2009 1509      Impression and Plan:   64 year old gentleman with the following issues:  1.Renal cell carcinoma presented with a large mass measuring 15.0 x 14.1 x 9.9 cm left renal mass extending off the upper pole of the left kidney into the retroperitoneum. He has also evidence of lymphadenopathy and small pulmonary nodules. He presented with abdominal pain and 20 pound weight loss. This is a biopsy proven to be renal cell carcinoma with a biopsy done on 05/04/2014. Attempted surgical resection for debulking purposes was unsuccessful.  He is currently on Votrient 800 mg daily and tolerated that dose well. He did have few complications include grade 1 diarrhea but have resolved as well as fatigue and anorexia. The plan is to continue the current dose and schedule and evaluate him in one month. He will have a repeat CT scan at the end of July 2016. This will be scheduled today.  2. Constipation: I have given him instructions to combat that including increasing the fiber in his diet as well as hydration and using stool softeners. I advised him not to use strong laxatives unless he does not have a bowel movement for 3 days. Votrient can cause severe diarrhea and for that reason he needs to be cautious about using strong laxatives.    3. Abdominal pain: He reports using 8 hydrocodone today and did not tolerate oxycodone properly. Given the chronic nature of his pain he will need long-acting pain medication. I will start him on fentanyl 25 g patch to be changed every 3 days. Risks and benefits of this medication were discussed today with instructions how to use his patch. Complications include  insomnia, depressed bleeding among other complications were reviewed. He is to remove his patch immediately if he experiences any side effects. He will use hydrocodone as breakthrough pain medication and hopefully he will use less. I'll given a prescription for both medication today.  4. Follow-up: Will be in one month to assess for any complications.   [facility], MD 6/2/20168:59 AM

## 2014-07-05 NOTE — Telephone Encounter (Signed)
per pof to sch pt appt-gave pt copy of sch °

## 2014-07-12 ENCOUNTER — Other Ambulatory Visit: Payer: Self-pay | Admitting: *Deleted

## 2014-07-12 MED ORDER — PAZOPANIB HCL 200 MG PO TABS: 800.0000 mg | ORAL_TABLET | Freq: Every day | ORAL | Status: DC

## 2014-07-17 ENCOUNTER — Telehealth: Payer: Self-pay

## 2014-07-17 NOTE — Telephone Encounter (Signed)
Wife Ivin Booty called requesting to speak to [facility] about the pt's disability claim. lvm with Lenise in managed care. Forwarded message to [employer]. Wife's phone # [phone].

## 2014-07-26 ENCOUNTER — Other Ambulatory Visit: Payer: Self-pay | Admitting: Oncology

## 2014-07-27 ENCOUNTER — Other Ambulatory Visit: Payer: Self-pay | Admitting: Oncology

## 2014-07-27 ENCOUNTER — Other Ambulatory Visit: Payer: Self-pay | Admitting: *Deleted

## 2014-07-27 DIAGNOSIS — N2889 Other specified disorders of kidney and ureter: Secondary | ICD-10-CM

## 2014-07-27 MED ORDER — FENTANYL 25 MCG/HR TD PT72: 25.0000 ug | MEDICATED_PATCH | TRANSDERMAL | Status: DC

## 2014-08-02 ENCOUNTER — Encounter: Payer: Self-pay | Admitting: Physician Assistant

## 2014-08-02 ENCOUNTER — Ambulatory Visit (HOSPITAL_BASED_OUTPATIENT_CLINIC_OR_DEPARTMENT_OTHER): Payer: Managed Care, Other (non HMO) | Admitting: Physician Assistant

## 2014-08-02 ENCOUNTER — Other Ambulatory Visit (HOSPITAL_BASED_OUTPATIENT_CLINIC_OR_DEPARTMENT_OTHER): Payer: Managed Care, Other (non HMO)

## 2014-08-02 VITALS — BP 150/82 | HR 85 | Temp 98.4°F | Resp 18 | Ht 74.0 in | Wt 153.1 lb

## 2014-08-02 DIAGNOSIS — C649 Malignant neoplasm of unspecified kidney, except renal pelvis: Secondary | ICD-10-CM

## 2014-08-02 DIAGNOSIS — R591 Generalized enlarged lymph nodes: Secondary | ICD-10-CM

## 2014-08-02 DIAGNOSIS — K5900 Constipation, unspecified: Secondary | ICD-10-CM | POA: Diagnosis not present

## 2014-08-02 DIAGNOSIS — C642 Malignant neoplasm of left kidney, except renal pelvis: Secondary | ICD-10-CM

## 2014-08-02 DIAGNOSIS — R109 Unspecified abdominal pain: Secondary | ICD-10-CM | POA: Diagnosis not present

## 2014-08-02 DIAGNOSIS — N2889 Other specified disorders of kidney and ureter: Secondary | ICD-10-CM

## 2014-08-02 LAB — CBC WITH DIFFERENTIAL/PLATELET
BASO%: 0.4 % (ref 0.0–2.0)
Basophils Absolute: 0 10*3/uL (ref 0.0–0.1)
EOS ABS: 0.1 10*3/uL (ref 0.0–0.5)
EOS%: 2.6 % (ref 0.0–7.0)
HEMATOCRIT: 37.6 % — AB (ref 38.4–49.9)
HEMOGLOBIN: 11.5 g/dL — AB (ref 13.0–17.1)
LYMPH%: 40.4 % (ref 14.0–49.0)
MCH: 24.5 pg — ABNORMAL LOW (ref 27.2–33.4)
MCHC: 30.6 g/dL — ABNORMAL LOW (ref 32.0–36.0)
MCV: 80 fL (ref 79.3–98.0)
MONO#: 0.4 10*3/uL (ref 0.1–0.9)
MONO%: 7.7 % (ref 0.0–14.0)
NEUT#: 2.3 10*3/uL (ref 1.5–6.5)
NEUT%: 48.9 % (ref 39.0–75.0)
Platelets: 203 10*3/uL (ref 140–400)
RBC: 4.7 10*6/uL (ref 4.20–5.82)
RDW: 25.8 % — ABNORMAL HIGH (ref 11.0–14.6)
WBC: 4.7 10*3/uL (ref 4.0–10.3)
lymph#: 1.9 10*3/uL (ref 0.9–3.3)

## 2014-08-02 LAB — COMPREHENSIVE METABOLIC PANEL (CC13)
ALK PHOS: 244 U/L — AB (ref 40–150)
ALT: 23 U/L (ref 0–55)
AST: 27 U/L (ref 5–34)
Albumin: 3 g/dL — ABNORMAL LOW (ref 3.5–5.0)
Anion Gap: 10 mEq/L (ref 3–11)
BILIRUBIN TOTAL: 0.44 mg/dL (ref 0.20–1.20)
BUN: 24.6 mg/dL (ref 7.0–26.0)
CO2: 30 meq/L — AB (ref 22–29)
Calcium: 9.9 mg/dL (ref 8.4–10.4)
Chloride: 101 mEq/L (ref 98–109)
Creatinine: 0.9 mg/dL (ref 0.7–1.3)
EGFR: 89 mL/min/{1.73_m2} — AB (ref >90)
GLUCOSE: 99 mg/dL (ref 70–140)
Potassium: 4.7 mEq/L (ref 3.5–5.1)
SODIUM: 141 meq/L (ref 136–145)
Total Protein: 8.2 g/dL (ref 6.4–8.3)

## 2014-08-02 NOTE — Progress Notes (Signed)
Hematology and Oncology Follow Up Visit  Jacob Beck [id number] 08-Apr-1950 64 y.o. 08/02/2014 5:56 PM   Principle Diagnosis: 64 year old gentleman with renal cell carcinoma diagnosed in April 2016. He presented with a large tumor on the left kidney measuring 15 cm and extending into the retroperitoneum causing proximal renal vein thrombosis.   Prior Therapy: Attempted surgical resection on 06/01/2014 that was unsuccessful given the size of tumor.  Current therapy: Votrient 800 mg daily started on 06/07/2014.  Interim History: Jacob Beck presents today for a follow-up visit. Since the last visit, he continues on Votrient. He continues to have some decreased appetite and a few episodes of diarrhea/constipation. He has not reported any other complications related to this medication.  His pain continues to be an issue but seems to have improved recently. He continues to use hydrocodone for pain management. He reports his pain is constant and associated with his tumor at the left upper quadrant of his abdomen. Is not associated with any nausea or vomiting. Not associated with any hematochezia or melanoma. The pain wakes him up in the middle of the night at times.   He does not report any headaches, blurry vision, double vision, syncope or seizures. He does not report any sweats or weight loss since the last visit. He does not report any chest pain, palpitation, orthopnea, leg edema. He does not report any cough, hemoptysis, hematemesis. He does not report any early satiety or abdominal distention. He does report flank pain but no hematuria, dysuria, frequency, urgency or hesitancy. He does not report any skeletal pains to his back pain shoulder pain or hip pain. He does not report any lymphadenopathy or petechiae. Remainder of review of systems unremarkable.   Medications: I have reviewed the patient's current medications.  Current Outpatient Prescriptions  Medication Sig Dispense Refill  .  fentaNYL (DURAGESIC - DOSED MCG/HR) 25 MCG/HR patch Place 1 patch (25 mcg total) onto the skin every 3 (three) days. 10 patch 0  . Fiber, Guar Gum, CHEW Chew 1 tablet by mouth daily.    Marland Kitchen HYDROcodone-acetaminophen (NORCO/VICODIN) 5-325 MG per tablet Take 2 tablets by mouth every 4 (four) hours as needed for moderate pain. 90 tablet 0  . ibuprofen (ADVIL,MOTRIN) 200 MG tablet Take 200 mg by mouth at bedtime.    . Multiple Vitamins-Minerals (MULTIVITAMIN WITH MINERALS) tablet Take 1 tablet by mouth daily.    Marland Kitchen VOTRIENT 200 MG tablet TAKE 4 TABLETS (800MG ) ORALLY ONCE DAILY ON AN EMPTY STOMACH (AT LEAST1 HOUR BEFORE OR 2 HOURS AFTER A MEAL). SWALLOW WHOLE. DO NOT CRUSH 120 tablet 0   No current facility-administered medications for this visit.     Allergies: No Known Allergies  Past Medical History, Surgical history, Social history, and Family History were reviewed and updated.   Physical Exam: Blood pressure 150/82, pulse 85, temperature 98.4 F (36.9 C), temperature source Oral, resp. rate 18, height 6\' 2"  (1.88 m), weight 153 lb 1.6 oz (69.446 kg), SpO2 100 %. ECOG: 1 General appearance: alert and cooperative. Chronically ill-appearing but not in any distress today. Head: Normocephalic, without obvious abnormality Neck: no adenopathy Lymph nodes: Cervical, supraclavicular, and axillary nodes normal. Heart:regular rate and rhythm, S1, S2 normal, no murmur, click, rub or gallop Lung:chest clear, no wheezing, rales, normal symmetric air entry Abdomin: soft, non-tender, without masses or organomegaly EXT:no erythema, induration, or nodules   Lab Results: Lab Results  Component Value Date   WBC 4.7 08/02/2014   HGB 11.5* 08/02/2014   HCT  37.6* 08/02/2014   MCV 80.0 08/02/2014   PLT 203 08/02/2014     Chemistry      Component Value Date/Time   NA 141 08/02/2014 1440   NA 138 01/08/2009 1509   K 4.7 08/02/2014 1440   K 4.2 01/08/2009 1509   CL 102 01/08/2009 1509   CO2 30*  08/02/2014 1440   CO2 31 01/08/2009 1509   BUN 24.6 08/02/2014 1440   BUN 22 01/08/2009 1509   CREATININE 0.9 08/02/2014 1440   CREATININE 0.95 01/08/2009 1509      Component Value Date/Time   CALCIUM 9.9 08/02/2014 1440   CALCIUM 10.0 01/08/2009 1509   ALKPHOS 244* 08/02/2014 1440   ALKPHOS 58 01/08/2009 1509   AST 27 08/02/2014 1440   AST 25 01/08/2009 1509   ALT 23 08/02/2014 1440   ALT 20 01/08/2009 1509   BILITOT 0.44 08/02/2014 1440   BILITOT 0.6 01/08/2009 1509      Impression and Plan:   64 year old gentleman with the following issues:  1.Renal cell carcinoma presented with a large mass measuring 15.0 x 14.1 x 9.9 cm left renal mass extending off the upper pole of the left kidney into the retroperitoneum. He has also evidence of lymphadenopathy and small pulmonary nodules. He presented with abdominal pain and 20 pound weight loss. This is a biopsy proven to be renal cell carcinoma with a biopsy done on 05/04/2014. Attempted surgical resection for debulking purposes was unsuccessful.  He is currently on Votrient 800 mg daily and is tolerating that dose well. He did have few complications include grade 1 diarrhea but have resolved as well as fatigue and anorexia. The plan is to continue the current dose and schedule and evaluate him in one month. He will have a repeat CT scan at the end of July 2016.   2. Constipation: He was reminded to increase the fiber in his diet as well as hydration and using stool softeners.  He was again advised not to use strong laxatives unless he does not have a bowel movement for 3 days. Votrient can cause severe diarrhea and for that reason he needs to be cautious about using strong laxatives.    3. Abdominal pain: He will continue on fentanyl 25 g patch, to be changed every 3 days. He may continue to use his hydrocodone on an as-needed basis for breakthrough pain. His pain management has improved with these measures.   4. Follow-up: Will be in  one month to assess for any complications.   Carlton Adam, PA-C  6/30/20165:56 PM

## 2014-08-06 NOTE — Patient Instructions (Signed)
Continue Votrient 800 mg (4 tablets) by mouth daily Keep the appointment for your restaging CT scan and follow-up with Dr. Alen Blew in one month

## 2014-08-08 ENCOUNTER — Encounter: Payer: Self-pay | Admitting: Skilled Nursing Facility1

## 2014-08-08 NOTE — Progress Notes (Signed)
Subjective:     Patient ID: Jacob Beck, male   DOB: 13-Jun-1950, 64 y.o.   MRN: [id number]  HPI   Review of Systems     Objective:   Physical Exam To assist the pt in identifying dietary strategies to gain some lost wt back.     Assessment:     Pt was identified as being malnourished due to lost wt. Pt was contacted via the telephone at [phone]. Pt was unavailable.    Plan:     Dietitian left a voicemail for the pt instructing him to contact Ernestene Kiel CSO,RD,LDN at [phone].

## 2014-08-09 ENCOUNTER — Other Ambulatory Visit: Payer: Self-pay | Admitting: Oncology

## 2014-08-09 ENCOUNTER — Encounter: Payer: Self-pay | Admitting: Oncology

## 2014-08-10 ENCOUNTER — Other Ambulatory Visit: Payer: Self-pay | Admitting: *Deleted

## 2014-08-10 ENCOUNTER — Telehealth: Payer: Self-pay | Admitting: *Deleted

## 2014-08-10 DIAGNOSIS — G8929 Other chronic pain: Secondary | ICD-10-CM

## 2014-08-10 MED ORDER — HYDROCODONE-ACETAMINOPHEN 5-325 MG PO TABS: 2.0000 | ORAL_TABLET | ORAL | Status: DC | PRN

## 2014-08-10 NOTE — Telephone Encounter (Signed)
Spoke with wife. Requested script for norco left at front for patient p/u.

## 2014-08-29 ENCOUNTER — Ambulatory Visit (HOSPITAL_COMMUNITY): Payer: Managed Care, Other (non HMO)

## 2014-08-29 ENCOUNTER — Other Ambulatory Visit (HOSPITAL_BASED_OUTPATIENT_CLINIC_OR_DEPARTMENT_OTHER): Payer: Managed Care, Other (non HMO)

## 2014-08-29 DIAGNOSIS — C649 Malignant neoplasm of unspecified kidney, except renal pelvis: Secondary | ICD-10-CM

## 2014-08-29 DIAGNOSIS — C642 Malignant neoplasm of left kidney, except renal pelvis: Secondary | ICD-10-CM

## 2014-08-29 LAB — CBC WITH DIFFERENTIAL/PLATELET
BASO%: 0.5 % (ref 0.0–2.0)
BASOS ABS: 0 10*3/uL (ref 0.0–0.1)
EOS ABS: 0.1 10*3/uL (ref 0.0–0.5)
EOS%: 2.4 % (ref 0.0–7.0)
HEMATOCRIT: 36.9 % — AB (ref 38.4–49.9)
HEMOGLOBIN: 11.8 g/dL — AB (ref 13.0–17.1)
LYMPH%: 33 % (ref 14.0–49.0)
MCH: 27.1 pg — ABNORMAL LOW (ref 27.2–33.4)
MCHC: 32.1 g/dL (ref 32.0–36.0)
MCV: 84.3 fL (ref 79.3–98.0)
MONO#: 0.4 10*3/uL (ref 0.1–0.9)
MONO%: 8.5 % (ref 0.0–14.0)
NEUT%: 55.6 % (ref 39.0–75.0)
NEUTROS ABS: 2.3 10*3/uL (ref 1.5–6.5)
PLATELETS: 202 10*3/uL (ref 140–400)
RBC: 4.37 10*6/uL (ref 4.20–5.82)
RDW: 32.6 % — AB (ref 11.0–14.6)
WBC: 4.2 10*3/uL (ref 4.0–10.3)
lymph#: 1.4 10*3/uL (ref 0.9–3.3)

## 2014-08-29 LAB — COMPREHENSIVE METABOLIC PANEL (CC13)
ALT: 17 U/L (ref 0–55)
AST: 21 U/L (ref 5–34)
Albumin: 3.3 g/dL — ABNORMAL LOW (ref 3.5–5.0)
Alkaline Phosphatase: 119 U/L (ref 40–150)
Anion Gap: 9 mEq/L (ref 3–11)
BILIRUBIN TOTAL: 0.41 mg/dL (ref 0.20–1.20)
BUN: 22.7 mg/dL (ref 7.0–26.0)
CALCIUM: 9.7 mg/dL (ref 8.4–10.4)
CHLORIDE: 104 meq/L (ref 98–109)
CO2: 28 mEq/L (ref 22–29)
CREATININE: 0.9 mg/dL (ref 0.7–1.3)
GLUCOSE: 102 mg/dL (ref 70–140)
Potassium: 4.8 mEq/L (ref 3.5–5.1)
Sodium: 140 mEq/L (ref 136–145)
TOTAL PROTEIN: 7.9 g/dL (ref 6.4–8.3)

## 2014-08-31 ENCOUNTER — Telehealth: Payer: Self-pay | Admitting: Oncology

## 2014-08-31 ENCOUNTER — Ambulatory Visit (HOSPITAL_BASED_OUTPATIENT_CLINIC_OR_DEPARTMENT_OTHER): Payer: Managed Care, Other (non HMO) | Admitting: Oncology

## 2014-08-31 VITALS — BP 136/80 | HR 77 | Temp 98.1°F | Resp 18 | Ht 74.0 in | Wt 157.0 lb

## 2014-08-31 DIAGNOSIS — C649 Malignant neoplasm of unspecified kidney, except renal pelvis: Secondary | ICD-10-CM

## 2014-08-31 DIAGNOSIS — C642 Malignant neoplasm of left kidney, except renal pelvis: Secondary | ICD-10-CM | POA: Diagnosis not present

## 2014-08-31 DIAGNOSIS — K5900 Constipation, unspecified: Secondary | ICD-10-CM | POA: Diagnosis not present

## 2014-08-31 DIAGNOSIS — R109 Unspecified abdominal pain: Secondary | ICD-10-CM

## 2014-08-31 DIAGNOSIS — N2889 Other specified disorders of kidney and ureter: Secondary | ICD-10-CM

## 2014-08-31 MED ORDER — FENTANYL 25 MCG/HR TD PT72: 25.0000 ug | MEDICATED_PATCH | TRANSDERMAL | Status: DC

## 2014-08-31 NOTE — Progress Notes (Signed)
Hematology and Oncology Follow Up Visit  Jacob Beck [id number] 1951-01-11 64 y.o. 08/31/2014 3:54 PM   Principle Diagnosis: 64 year old gentleman with renal cell carcinoma diagnosed in April 2016. He presented with a large tumor on the left kidney measuring 15 cm and extending into the retroperitoneum causing proximal renal vein thrombosis.   Prior Therapy: Attempted surgical resection on 06/01/2014 that was unsuccessful given the size of tumor.  Current therapy: Votrient 800 mg daily started on 06/07/2014.  Interim History: Jacob Beck presents today for a follow-up visit. Since the last visit, he reports no complaints. He continues on Votrient without any new complications. He denied changes in his bowels or appetite changes.He continues to have some decreased appetite and a few episodes of diarrhea/constipation. He has not reported any other complications related to this medication.  His pain continues to improve and rarely takes hydrocodone at this time. His fentanyl patch seemed to have helped his baseline pain levels  He does not report any headaches, blurry vision, double vision, syncope or seizures. He does not report any sweats or weight loss since the last visit. He does not report any chest pain, palpitation, orthopnea, leg edema. He does not report any cough, hemoptysis, hematemesis. He does not report any early satiety or abdominal distention. He does report flank pain but no hematuria, dysuria, frequency, urgency or hesitancy. He does not report any skeletal pains to his back pain shoulder pain or hip pain. He does not report any lymphadenopathy or petechiae. Remainder of review of systems unremarkable.   Medications: I have reviewed the patient's current medications.  Current Outpatient Prescriptions  Medication Sig Dispense Refill  . fentaNYL (DURAGESIC - DOSED MCG/HR) 25 MCG/HR patch Place 1 patch (25 mcg total) onto the skin every 3 (three) days. 10 patch 0  . Fiber, Guar  Gum, CHEW Chew 1 tablet by mouth daily.    Marland Kitchen HYDROcodone-acetaminophen (NORCO/VICODIN) 5-325 MG per tablet Take 2 tablets by mouth every 4 (four) hours as needed for moderate pain. 90 tablet 0  . ibuprofen (ADVIL,MOTRIN) 200 MG tablet Take 200 mg by mouth at bedtime.    . Multiple Vitamins-Minerals (MULTIVITAMIN WITH MINERALS) tablet Take 1 tablet by mouth daily.    Marland Kitchen VOTRIENT 200 MG tablet TAKE 4 TABLETS (800MG ) ORALLY ONCE DAILY ON AN EMPTY STOMACH (AT LEAST1 HOUR BEFORE OR 2 HOURS AFTER A MEAL). SWALLOW WHOLE. DO NOT CRUSH 120 tablet 0   No current facility-administered medications for this visit.     Allergies: No Known Allergies  Past Medical History, Surgical history, Social history, and Family History were reviewed and updated.   Physical Exam: Blood pressure 136/80, pulse 77, temperature 98.1 F (36.7 C), temperature source Oral, resp. rate 18, height 6\' 2"  (1.88 m), weight 157 lb (71.215 kg), SpO2 98 %. ECOG: 1 General appearance: alert and cooperative.Nnot in any distress today. Head: Normocephalic, without obvious abnormality Neck: no adenopathy Lymph nodes: Cervical, supraclavicular, and axillary nodes normal. Heart:regular rate and rhythm, S1, S2 normal, no murmur, click, rub or gallop Lung:chest clear, no wheezing, rales, normal symmetric air entry Abdomin: soft, non-tender, without masses or organomegaly EXT:no erythema, induration, or nodules   Lab Results: Lab Results  Component Value Date   WBC 4.2 08/29/2014   HGB 11.8* 08/29/2014   HCT 36.9* 08/29/2014   MCV 84.3 08/29/2014   PLT 202 08/29/2014     Chemistry      Component Value Date/Time   NA 140 08/29/2014 0953   NA 138 01/08/2009  1509   K 4.8 08/29/2014 0953   K 4.2 01/08/2009 1509   CL 102 01/08/2009 1509   CO2 28 08/29/2014 0953   CO2 31 01/08/2009 1509   BUN 22.7 08/29/2014 0953   BUN 22 01/08/2009 1509   CREATININE 0.9 08/29/2014 0953   CREATININE 0.95 01/08/2009 1509      Component  Value Date/Time   CALCIUM 9.7 08/29/2014 0953   CALCIUM 10.0 01/08/2009 1509   ALKPHOS 119 08/29/2014 0953   ALKPHOS 58 01/08/2009 1509   AST 21 08/29/2014 0953   AST 25 01/08/2009 1509   ALT 17 08/29/2014 0953   ALT 20 01/08/2009 1509   BILITOT 0.41 08/29/2014 0953   BILITOT 0.6 01/08/2009 1509      Impression and Plan:   64 year old gentleman with the following issues:  1.Renal cell carcinoma presented with a large mass measuring 15.0 x 14.1 x 9.9 cm left renal mass extending off the upper pole of the left kidney into the retroperitoneum. He has also evidence of lymphadenopathy and small pulmonary nodules. He presented with abdominal pain and 20 pound weight loss. This is a biopsy proven to be renal cell carcinoma with a biopsy done on 05/04/2014. Attempted surgical resection for debulking purposes was unsuccessful.  He is currently on Votrient 800 mg daily and is tolerating that dose well. He did have few complications include grade 1 diarrhea but have resolved as well as fatigue and anorexia.  CT scan from 08/29/2014 was reviewed today and showed no major changes. He is a large neoplasm arising from the left kidney have marginally decreased. No new lymphadenopathy noted.   The plan is to continue the same dose and schedule and repeat imaging studies in 4 months.  2. Constipation:  Improved at this time.  3. Abdominal pain: He will continue on fentanyl 25 g patch, to be changed every 3 days. This was refilled today and he will use hydrocodone as needed.  4. Follow-up: Will be in one month to assess for any complications.   [facility], MD  7/29/20163:54 PM

## 2014-08-31 NOTE — Telephone Encounter (Signed)
Gave adn printed appt sched and avs for pt for Aug °

## 2014-09-04 ENCOUNTER — Encounter: Payer: Self-pay | Admitting: Oncology

## 2014-09-04 ENCOUNTER — Telehealth: Payer: Self-pay | Admitting: *Deleted

## 2014-09-04 NOTE — Telephone Encounter (Signed)
Patient calling to c/o pain at groin area, ? Hernia. Per dr Alen Blew, if pain gets worse, go to the E.R. Patient verbalized understanding.

## 2014-09-11 ENCOUNTER — Other Ambulatory Visit: Payer: Self-pay | Admitting: *Deleted

## 2014-09-11 DIAGNOSIS — N2889 Other specified disorders of kidney and ureter: Secondary | ICD-10-CM

## 2014-09-11 MED ORDER — PAZOPANIB HCL 200 MG PO TABS: ORAL_TABLET | ORAL | Status: DC

## 2014-09-15 ENCOUNTER — Other Ambulatory Visit: Payer: Self-pay | Admitting: Hematology and Oncology

## 2014-09-15 DIAGNOSIS — G8929 Other chronic pain: Secondary | ICD-10-CM

## 2014-09-18 ENCOUNTER — Other Ambulatory Visit: Payer: Self-pay | Admitting: *Deleted

## 2014-09-18 DIAGNOSIS — G8929 Other chronic pain: Secondary | ICD-10-CM

## 2014-09-18 MED ORDER — HYDROCODONE-ACETAMINOPHEN 5-325 MG PO TABS: 2.0000 | ORAL_TABLET | ORAL | Status: DC | PRN

## 2014-09-28 ENCOUNTER — Other Ambulatory Visit (HOSPITAL_BASED_OUTPATIENT_CLINIC_OR_DEPARTMENT_OTHER): Payer: Managed Care, Other (non HMO)

## 2014-09-28 ENCOUNTER — Encounter: Payer: Self-pay | Admitting: Physician Assistant

## 2014-09-28 ENCOUNTER — Ambulatory Visit (HOSPITAL_BASED_OUTPATIENT_CLINIC_OR_DEPARTMENT_OTHER): Payer: Managed Care, Other (non HMO) | Admitting: Physician Assistant

## 2014-09-28 ENCOUNTER — Telehealth: Payer: Self-pay | Admitting: Oncology

## 2014-09-28 VITALS — BP 134/68 | HR 72 | Temp 98.1°F | Resp 18 | Ht 74.0 in | Wt 161.7 lb

## 2014-09-28 DIAGNOSIS — C649 Malignant neoplasm of unspecified kidney, except renal pelvis: Secondary | ICD-10-CM

## 2014-09-28 DIAGNOSIS — R109 Unspecified abdominal pain: Secondary | ICD-10-CM | POA: Diagnosis not present

## 2014-09-28 DIAGNOSIS — K5900 Constipation, unspecified: Secondary | ICD-10-CM | POA: Diagnosis not present

## 2014-09-28 DIAGNOSIS — C642 Malignant neoplasm of left kidney, except renal pelvis: Secondary | ICD-10-CM

## 2014-09-28 DIAGNOSIS — N2889 Other specified disorders of kidney and ureter: Secondary | ICD-10-CM

## 2014-09-28 LAB — CBC WITH DIFFERENTIAL/PLATELET
BASO%: 0.4 % (ref 0.0–2.0)
BASOS ABS: 0 10*3/uL (ref 0.0–0.1)
EOS ABS: 0.1 10*3/uL (ref 0.0–0.5)
EOS%: 3 % (ref 0.0–7.0)
HCT: 38.9 % (ref 38.4–49.9)
HGB: 13 g/dL (ref 13.0–17.1)
LYMPH%: 36.8 % (ref 14.0–49.0)
MCH: 30.9 pg (ref 27.2–33.4)
MCHC: 33.4 g/dL (ref 32.0–36.0)
MCV: 92.8 fL (ref 79.3–98.0)
MONO#: 0.3 10*3/uL (ref 0.1–0.9)
MONO%: 6.9 % (ref 0.0–14.0)
NEUT#: 2.2 10*3/uL (ref 1.5–6.5)
NEUT%: 52.9 % (ref 39.0–75.0)
PLATELETS: 183 10*3/uL (ref 140–400)
RBC: 4.2 10*6/uL (ref 4.20–5.82)
RDW: 26.4 % — ABNORMAL HIGH (ref 11.0–14.6)
WBC: 4.2 10*3/uL (ref 4.0–10.3)
lymph#: 1.5 10*3/uL (ref 0.9–3.3)

## 2014-09-28 LAB — COMPREHENSIVE METABOLIC PANEL (CC13)
ALT: 16 U/L (ref 0–55)
ANION GAP: 9 meq/L (ref 3–11)
AST: 20 U/L (ref 5–34)
Albumin: 3.4 g/dL — ABNORMAL LOW (ref 3.5–5.0)
Alkaline Phosphatase: 96 U/L (ref 40–150)
BUN: 21 mg/dL (ref 7.0–26.0)
CO2: 31 meq/L — AB (ref 22–29)
Calcium: 9.6 mg/dL (ref 8.4–10.4)
Chloride: 102 mEq/L (ref 98–109)
Creatinine: 0.9 mg/dL (ref 0.7–1.3)
GLUCOSE: 103 mg/dL (ref 70–140)
POTASSIUM: 4.2 meq/L (ref 3.5–5.1)
SODIUM: 142 meq/L (ref 136–145)
Total Bilirubin: 0.45 mg/dL (ref 0.20–1.20)
Total Protein: 8 g/dL (ref 6.4–8.3)

## 2014-09-28 MED ORDER — PROCHLORPERAZINE MALEATE 5 MG PO TABS: 5.0000 mg | ORAL_TABLET | Freq: Four times a day (QID) | ORAL | Status: DC | PRN

## 2014-09-28 MED ORDER — FENTANYL 25 MCG/HR TD PT72: 25.0000 ug | MEDICATED_PATCH | TRANSDERMAL | Status: DC

## 2014-09-28 NOTE — Progress Notes (Signed)
Hematology and Oncology Follow Up Visit  Jacob Beck [id number] 20-Apr-1950 64 y.o. 09/28/2014 10:50 AM   Principle Diagnosis: 64 year old gentleman with renal cell carcinoma diagnosed in April 2016. He presented with a large tumor on the left kidney measuring 15 cm and extending into the retroperitoneum causing proximal renal vein thrombosis.   Prior Therapy: Attempted surgical resection on 06/01/2014 that was unsuccessful given the size of tumor.  Current therapy: Votrient 800 mg daily started on 06/07/2014.  Interim History: Jacob Beck presents today for a follow-up visit. Since the last visit, he reports no complaints with the exception of some queasiness after eating. This queasiness occurs about 3-4 times a week. He continues on Votrient without any other new complications. He denied changes in his bowels or appetite changes.He continues to have some decreased appetite and a few episodes of diarrhea/constipation. He has not reported any other complications related to this medication.  His pain continues to improve and rarely takes hydrocodone at this time. His fentanyl patch seemed to have helped his baseline pain levels  He does not report any headaches, blurry vision, double vision, syncope or seizures. He does not report any sweats or weight loss since the last visit. He does not report any chest pain, palpitation, orthopnea, leg edema. He does not report any cough, hemoptysis, hematemesis. He does not report any early satiety or abdominal distention. He does report flank pain but no hematuria, dysuria, frequency, urgency or hesitancy. He does not report any skeletal pains to his back pain shoulder pain or hip pain. He does not report any lymphadenopathy or petechiae. Remainder of review of systems unremarkable.   Medications: I have reviewed the patient's current medications.  Current Outpatient Prescriptions  Medication Sig Dispense Refill  . fentaNYL (DURAGESIC - DOSED MCG/HR) 25  MCG/HR patch Place 1 patch (25 mcg total) onto the skin every 3 (three) days. 10 patch 0  . Fiber, Guar Gum, CHEW Chew 1 tablet by mouth daily.    Marland Kitchen HYDROcodone-acetaminophen (NORCO/VICODIN) 5-325 MG per tablet Take 2 tablets by mouth every 4 (four) hours as needed for moderate pain. 90 tablet 0  . ibuprofen (ADVIL,MOTRIN) 200 MG tablet Take 200 mg by mouth at bedtime.    . Multiple Vitamins-Minerals (MULTIVITAMIN WITH MINERALS) tablet Take 1 tablet by mouth daily.    . pazopanib (VOTRIENT) 200 MG tablet Take 4 tablets (800 mg) by mouth daily on an empty stomach (at least 1 hour before or 2 hours after a meal). Swallow whole. Do not crush. 120 tablet 0  . prochlorperazine (COMPAZINE) 5 MG tablet Take 1 tablet (5 mg total) by mouth every 6 (six) hours as needed for nausea or vomiting. 30 tablet 0   No current facility-administered medications for this visit.     Allergies: No Known Allergies  Past Medical History, Surgical history, Social history, and Family History were reviewed and updated.   Physical Exam: Blood pressure 134/68, pulse 72, temperature 98.1 F (36.7 C), temperature source Oral, resp. rate 18, height 6\' 2"  (1.88 m), weight 161 lb 11.2 oz (73.347 kg), SpO2 99 %. ECOG: 1 General appearance: alert and cooperative.Nnot in any distress today. Head: Normocephalic, without obvious abnormality Neck: no adenopathy Lymph nodes: Cervical, supraclavicular, and axillary nodes normal. Heart:regular rate and rhythm, S1, S2 normal, no murmur, click, rub or gallop Lung:chest clear, no wheezing, rales, normal symmetric air entry Abdomin: soft, non-tender, without masses or organomegaly EXT:no erythema, induration, or nodules   Lab Results: Lab Results  Component Value  Date   WBC 4.2 09/28/2014   HGB 13.0 09/28/2014   HCT 38.9 09/28/2014   MCV 92.8 09/28/2014   PLT 183 09/28/2014     Chemistry      Component Value Date/Time   NA 142 09/28/2014 0909   NA 138 01/08/2009 1509    K 4.2 09/28/2014 0909   K 4.2 01/08/2009 1509   CL 102 01/08/2009 1509   CO2 31* 09/28/2014 0909   CO2 31 01/08/2009 1509   BUN 21.0 09/28/2014 0909   BUN 22 01/08/2009 1509   CREATININE 0.9 09/28/2014 0909   CREATININE 0.95 01/08/2009 1509      Component Value Date/Time   CALCIUM 9.6 09/28/2014 0909   CALCIUM 10.0 01/08/2009 1509   ALKPHOS 96 09/28/2014 0909   ALKPHOS 58 01/08/2009 1509   AST 20 09/28/2014 0909   AST 25 01/08/2009 1509   ALT 16 09/28/2014 0909   ALT 20 01/08/2009 1509   BILITOT 0.45 09/28/2014 0909   BILITOT 0.6 01/08/2009 1509      Impression and Plan:   64 year old gentleman with the following issues:  1.Renal cell carcinoma presented with a large mass measuring 15.0 x 14.1 x 9.9 cm left renal mass extending off the upper pole of the left kidney into the retroperitoneum. He has also evidence of lymphadenopathy and small pulmonary nodules. He presented with abdominal pain and 20 pound weight loss. This is a biopsy proven to be renal cell carcinoma with a biopsy done on 05/04/2014. Attempted surgical resection for debulking purposes was unsuccessful.  He is currently on Votrient 800 mg daily and is tolerating that dose well. He did have few complications include grade 1 diarrhea but have resolved as well as fatigue and anorexia. For the queasiness/nausea we will start him on Compazine 5 mg by mouth every 6 hours as needed. Prescription for 30 tablets was sent to his pharmacy of record via Beck scribe.  CT scan from 08/29/2014 showed no major changes. He has a large neoplasm arising from the left kidney have marginally decreased. No new lymphadenopathy noted.   The plan is to continue the same dose and schedule and repeat imaging studies in 4 months.  2. Constipation:  Improved at this time.  3. Abdominal pain: He will continue on fentanyl 25 g patch, to be changed every 3 days. He will use hydrocodone as needed.  4. Follow-up: Will be in one month to assess  for any complications.   Jacob Depaulo E, PA-C  8/26/201610:50 AM

## 2014-09-28 NOTE — Telephone Encounter (Signed)
Gave adn printed appt sched and avs for pt for Sept...the patient needed morning appt

## 2014-10-01 ENCOUNTER — Other Ambulatory Visit: Payer: Self-pay | Admitting: General Surgery

## 2014-10-02 ENCOUNTER — Encounter: Payer: Self-pay | Admitting: *Deleted

## 2014-10-02 NOTE — Progress Notes (Unsigned)
Spoke with wife sharon Huante, per dr Alen Blew, he is to hold votrient for 7 days prior to upcoming surgery on sept 12th. Resume medication upon return home from the hospital. Wife verbalized understanding.

## 2014-10-04 NOTE — Patient Instructions (Signed)
Continue Votrient at the current dose Take Compazine 5 mg by mouth every 6 hours as needed for queasiness/nausea Follow-up in one month

## 2014-10-09 ENCOUNTER — Encounter: Payer: Self-pay | Admitting: Oncology

## 2014-10-09 ENCOUNTER — Other Ambulatory Visit (HOSPITAL_COMMUNITY): Payer: Self-pay | Admitting: *Deleted

## 2014-10-09 NOTE — Pre-Procedure Instructions (Addendum)
Jacob Beck  10/09/2014      [facility] [address] [place], [place] [address] Phone: [phone] Fax: [phone]  [facility] [place] [address] Phone: [phone] Fax: [phone]    Your procedure is scheduled on Monday, October 15, 2014 at 11:00 AM.             Report to [facility] Entrance "A" Admitting Office at 9:00 AM.  Call this number if you have problems the morning of surgery: [phone]  Any questions prior to day of surgery, please call [phone] between 8 & 4 PM.   Remember:  Do not eat food or drink liquids after midnight Sunday, 10/14/14.  Take these medicines the morning of surgery with A SIP OF WATER: Hydrocodone - if needed, Compazine - if needed.  Stop Multivitamins as of today. STOP votrient today per dr note   Do not wear jewelry.  Do not wear lotions, powders, or cologne.  You may wear deodorant.  Men may shave face and neck.  Do not bring valuables to the hospital.  [facility] is not responsible for any belongings or valuables.  Contacts, dentures or bridgework may not be worn into surgery.  Leave your suitcase in the car.  After surgery it may be brought to your room.  For patients admitted to the hospital, discharge time will be determined by your treatment team.  Patients discharged the day of surgery will not be allowed to drive home.   Special instructions:  Special Instructions: [facility] - Preparing for Surgery  Before surgery, you can play an important role.  Because skin is not sterile, your skin needs to be as free of germs as possible.  You can reduce the number of germs on you skin by washing with CHG (chlorahexidine gluconate) soap before surgery.  CHG is an antiseptic cleaner which kills germs and bonds with the skin to continue killing germs even after  washing.  Please DO NOT use if you have an allergy to CHG or antibacterial soaps.  If your skin becomes reddened/irritated stop using the CHG and inform your nurse when you arrive at Short Stay.  Do not shave (including legs and underarms) for at least 48 hours prior to the first CHG shower.  You may shave your face.  Please follow these instructions carefully:   1.  Shower with CHG Soap the night before surgery and the morning of Surgery.  2.  If you choose to wash your hair, wash your hair first as usual with your normal shampoo.  3.  After you shampoo, rinse your hair and body thoroughly to remove the Shampoo.  4.  Use CHG as you would any other liquid soap.  You can apply chg directly  to the skin and wash gently with scrungie or a clean washcloth.  5.  Apply the CHG Soap to your body ONLY FROM THE NECK DOWN.  Do not use on open wounds or open sores.  Avoid contact with your eyes ears, mouth and genitals (private parts).  Wash genitals (private parts)       with your normal soap.  6.  Wash thoroughly, paying special attention to the area where your surgery will be performed.  7.  Thoroughly rinse your body with warm water from the neck down.  8.  DO NOT shower/wash with  your normal soap after using and rinsing off the CHG Soap.  9.  Pat yourself dry with a clean towel.            10.  Wear clean pajamas.            11.  Place clean sheets on your bed the night of your first shower and do not sleep with pets.  Day of Surgery  Do not apply any lotions/deodorants the morning of surgery.  Please wear clean clothes to the hospital/surgery center.  Please read over the following fact sheets that you were given. Pain Booklet, Coughing and Deep Breathing and Surgical Site Infection Prevention

## 2014-10-09 NOTE — Progress Notes (Signed)
I placed restriction form on desk of nurse for dr Alen Blew.

## 2014-10-10 ENCOUNTER — Encounter (HOSPITAL_COMMUNITY): Payer: Self-pay

## 2014-10-10 ENCOUNTER — Encounter (HOSPITAL_COMMUNITY)
Admission: RE | Admit: 2014-10-10 | Discharge: 2014-10-10 | Disposition: A | Discharge status: Home / Self Care | Payer: Managed Care, Other (non HMO) | Source: Ambulatory Visit | Attending: General Surgery | Admitting: General Surgery

## 2014-10-10 ENCOUNTER — Encounter: Payer: Self-pay | Admitting: Oncology

## 2014-10-10 DIAGNOSIS — Z01810 Encounter for preprocedural cardiovascular examination: Secondary | ICD-10-CM | POA: Insufficient documentation

## 2014-10-10 DIAGNOSIS — Z01812 Encounter for preprocedural laboratory examination: Secondary | ICD-10-CM | POA: Diagnosis not present

## 2014-10-10 DIAGNOSIS — K4090 Unilateral inguinal hernia, without obstruction or gangrene, not specified as recurrent: Secondary | ICD-10-CM | POA: Insufficient documentation

## 2014-10-10 HISTORY — DX: Gastro-esophageal reflux disease without esophagitis: K21.9

## 2014-10-10 NOTE — Progress Notes (Signed)
I faxed [employer] form/notes/labs  [phone]

## 2014-10-10 NOTE — Progress Notes (Signed)
Dr Donne Hazel office called re: redraw labs needed(sampled preadmit clotted) -can we do dos or does patient need to come in before then for redraw. Spoke with crystal in office   To call back.

## 2014-10-11 NOTE — Progress Notes (Signed)
Crystal returned call and stated that lab work could be drawn STAT DOS since lab work clotted during [employer] appointment.

## 2014-10-14 MED ORDER — CEFAZOLIN SODIUM-DEXTROSE 2-3 GM-% IV SOLR
2.0000 g | INTRAVENOUS | Status: AC
  Administered 2014-10-15: 2 g via INTRAVENOUS
  Filled 2014-10-14: qty 50

## 2014-10-15 ENCOUNTER — Ambulatory Visit (HOSPITAL_COMMUNITY): Payer: Managed Care, Other (non HMO) | Admitting: Certified Registered"

## 2014-10-15 ENCOUNTER — Ambulatory Visit (HOSPITAL_COMMUNITY)
Admission: RE | Admit: 2014-10-15 | Discharge: 2014-10-15 | Disposition: A | Discharge status: Home / Self Care | Payer: Managed Care, Other (non HMO) | Source: Ambulatory Visit | Attending: General Surgery | Admitting: General Surgery

## 2014-10-15 ENCOUNTER — Encounter (HOSPITAL_COMMUNITY)
Admission: RE | Disposition: A | Discharge status: Home / Self Care | Payer: Self-pay | Source: Ambulatory Visit | Attending: General Surgery

## 2014-10-15 ENCOUNTER — Encounter (HOSPITAL_COMMUNITY): Payer: Self-pay | Admitting: General Practice

## 2014-10-15 ENCOUNTER — Ambulatory Visit (HOSPITAL_COMMUNITY): Payer: Managed Care, Other (non HMO) | Admitting: Vascular Surgery

## 2014-10-15 DIAGNOSIS — Z87891 Personal history of nicotine dependence: Secondary | ICD-10-CM | POA: Diagnosis not present

## 2014-10-15 DIAGNOSIS — K4090 Unilateral inguinal hernia, without obstruction or gangrene, not specified as recurrent: Secondary | ICD-10-CM | POA: Insufficient documentation

## 2014-10-15 HISTORY — PX: INGUINAL HERNIA REPAIR: SHX194

## 2014-10-15 HISTORY — PX: INSERTION OF MESH: SHX5868

## 2014-10-15 LAB — CBC WITH DIFFERENTIAL/PLATELET
Basophils Absolute: 0 10*3/uL (ref 0.0–0.1)
Basophils Relative: 0 % (ref 0–1)
Eosinophils Absolute: 0.1 10*3/uL (ref 0.0–0.7)
Eosinophils Relative: 1 % (ref 0–5)
HEMATOCRIT: 35.9 % — AB (ref 39.0–52.0)
Hemoglobin: 11.7 g/dL — ABNORMAL LOW (ref 13.0–17.0)
LYMPHS PCT: 26 % (ref 12–46)
Lymphs Abs: 1.1 10*3/uL (ref 0.7–4.0)
MCH: 32.3 pg (ref 26.0–34.0)
MCHC: 32.6 g/dL (ref 30.0–36.0)
MCV: 99.2 fL (ref 78.0–100.0)
MONO ABS: 0.4 10*3/uL (ref 0.1–1.0)
MONOS PCT: 8 % (ref 3–12)
NEUTROS ABS: 2.7 10*3/uL (ref 1.7–7.7)
Neutrophils Relative %: 65 % (ref 43–77)
Platelets: 157 10*3/uL (ref 150–400)
RBC: 3.62 MIL/uL — ABNORMAL LOW (ref 4.22–5.81)
RDW: 19.3 % — AB (ref 11.5–15.5)
WBC: 4.3 10*3/uL (ref 4.0–10.5)

## 2014-10-15 LAB — COMPREHENSIVE METABOLIC PANEL
ALT: 13 U/L — ABNORMAL LOW (ref 17–63)
ANION GAP: 9 (ref 5–15)
AST: 19 U/L (ref 15–41)
Albumin: 3.3 g/dL — ABNORMAL LOW (ref 3.5–5.0)
Alkaline Phosphatase: 79 U/L (ref 38–126)
BILIRUBIN TOTAL: 0.5 mg/dL (ref 0.3–1.2)
BUN: 19 mg/dL (ref 6–20)
CO2: 26 mmol/L (ref 22–32)
Calcium: 9 mg/dL (ref 8.9–10.3)
Chloride: 105 mmol/L (ref 101–111)
Creatinine, Ser: 0.72 mg/dL (ref 0.61–1.24)
GFR calc Af Amer: >60 mL/min (ref >60)
Glucose, Bld: 101 mg/dL — ABNORMAL HIGH (ref 65–99)
POTASSIUM: 3.9 mmol/L (ref 3.5–5.1)
Sodium: 140 mmol/L (ref 135–145)
TOTAL PROTEIN: 7.5 g/dL (ref 6.5–8.1)

## 2014-10-15 SURGERY — REPAIR, HERNIA, INGUINAL, ADULT
Anesthesia: Regional | Site: Groin | Laterality: Right

## 2014-10-15 MED ORDER — MIDAZOLAM HCL 5 MG/5ML IJ SOLN
INTRAMUSCULAR | Status: DC | PRN
  Administered 2014-10-15: 2 mg via INTRAVENOUS

## 2014-10-15 MED ORDER — ONDANSETRON HCL 4 MG/2ML IJ SOLN
INTRAMUSCULAR | Status: DC | PRN
  Administered 2014-10-15: 4 mg via INTRAVENOUS

## 2014-10-15 MED ORDER — HYDROCODONE-ACETAMINOPHEN 7.5-325 MG PO TABS
1.0000 | ORAL_TABLET | Freq: Once | ORAL | Status: AC | PRN
  Administered 2014-10-15: 1 via ORAL

## 2014-10-15 MED ORDER — HYDROMORPHONE HCL 1 MG/ML IJ SOLN
0.2500 mg | INTRAMUSCULAR | Status: DC | PRN
  Administered 2014-10-15 (×2): 0.5 mg via INTRAVENOUS

## 2014-10-15 MED ORDER — SODIUM CHLORIDE 0.9 % IJ SOLN: 3.0000 mL | Freq: Two times a day (BID) | INTRAMUSCULAR | Status: DC

## 2014-10-15 MED ORDER — LACTATED RINGERS IV SOLN
INTRAVENOUS | Status: DC
  Administered 2014-10-15: 10:00:00 via INTRAVENOUS

## 2014-10-15 MED ORDER — HYDROCODONE-ACETAMINOPHEN 7.5-325 MG PO TABS
ORAL_TABLET | ORAL | Status: DC
Start: 2014-10-15 — End: 2014-10-15
  Filled 2014-10-15: qty 1

## 2014-10-15 MED ORDER — HYDROMORPHONE HCL 1 MG/ML IJ SOLN
INTRAMUSCULAR | Status: AC
  Filled 2014-10-15: qty 1

## 2014-10-15 MED ORDER — FENTANYL CITRATE (PF) 100 MCG/2ML IJ SOLN
INTRAMUSCULAR | Status: DC | PRN
  Administered 2014-10-15 (×3): 25 ug via INTRAVENOUS

## 2014-10-15 MED ORDER — ACETAMINOPHEN 650 MG RE SUPP: 650.0000 mg | RECTAL | Status: DC | PRN

## 2014-10-15 MED ORDER — SODIUM CHLORIDE 0.9 % IV SOLN: 250.0000 mL | INTRAVENOUS | Status: DC | PRN

## 2014-10-15 MED ORDER — ACETAMINOPHEN 325 MG PO TABS: 650.0000 mg | ORAL_TABLET | ORAL | Status: DC | PRN

## 2014-10-15 MED ORDER — ARTIFICIAL TEARS OP OINT
TOPICAL_OINTMENT | OPHTHALMIC | Status: DC | PRN
  Administered 2014-10-15: 1 via OPHTHALMIC

## 2014-10-15 MED ORDER — MIDAZOLAM HCL 2 MG/2ML IJ SOLN
INTRAMUSCULAR | Status: AC
  Filled 2014-10-15: qty 2

## 2014-10-15 MED ORDER — BUPIVACAINE-EPINEPHRINE (PF) 0.5% -1:200000 IJ SOLN
INTRAMUSCULAR | Status: DC | PRN
  Administered 2014-10-15: 30 mL via PERINEURAL

## 2014-10-15 MED ORDER — 0.9 % SODIUM CHLORIDE (POUR BTL) OPTIME
TOPICAL | Status: DC | PRN
  Administered 2014-10-15: 1000 mL

## 2014-10-15 MED ORDER — MORPHINE SULFATE (PF) 2 MG/ML IV SOLN: 2.0000 mg | INTRAVENOUS | Status: DC | PRN

## 2014-10-15 MED ORDER — FENTANYL CITRATE (PF) 250 MCG/5ML IJ SOLN
INTRAMUSCULAR | Status: AC
  Filled 2014-10-15: qty 5

## 2014-10-15 MED ORDER — BUPIVACAINE HCL (PF) 0.25 % IJ SOLN
INTRAMUSCULAR | Status: DC | PRN
  Administered 2014-10-15: 4 mL

## 2014-10-15 MED ORDER — SODIUM CHLORIDE 0.9 % IJ SOLN: 3.0000 mL | INTRAMUSCULAR | Status: DC | PRN

## 2014-10-15 MED ORDER — LACTATED RINGERS IV SOLN
INTRAVENOUS | Status: DC | PRN
  Administered 2014-10-15: 11:00:00 via INTRAVENOUS

## 2014-10-15 MED ORDER — HYDROCODONE-ACETAMINOPHEN 5-325 MG PO TABS: 1.0000 | ORAL_TABLET | ORAL | Status: DC | PRN

## 2014-10-15 MED ORDER — PROPOFOL 10 MG/ML IV BOLUS
INTRAVENOUS | Status: AC
  Filled 2014-10-15: qty 20

## 2014-10-15 MED ORDER — PROPOFOL 10 MG/ML IV BOLUS
INTRAVENOUS | Status: DC | PRN
  Administered 2014-10-15: 200 mg via INTRAVENOUS

## 2014-10-15 SURGICAL SUPPLY — 44 items
BLADE SURG ROTATE 9660 (MISCELLANEOUS) IMPLANT
CANISTER SUCTION 2500CC (MISCELLANEOUS) IMPLANT
CHLORAPREP W/TINT 26ML (MISCELLANEOUS) ×2 IMPLANT
COVER SURGICAL LIGHT HANDLE (MISCELLANEOUS) ×2 IMPLANT
DECANTER SPIKE VIAL GLASS SM (MISCELLANEOUS) IMPLANT
DRAIN PENROSE 1/2X12 LTX STRL (WOUND CARE) IMPLANT
DRAPE LAPAROTOMY TRNSV 102X78 (DRAPE) ×2 IMPLANT
DRAPE UTILITY XL STRL (DRAPES) ×2 IMPLANT
ELECT CAUTERY BLADE 6.4 (BLADE) ×2 IMPLANT
ELECT REM PT RETURN 9FT ADLT (ELECTROSURGICAL) ×2
ELECTRODE REM PT RTRN 9FT ADLT (ELECTROSURGICAL) ×1 IMPLANT
GAUZE SPONGE 4X4 16PLY XRAY LF (GAUZE/BANDAGES/DRESSINGS) ×2 IMPLANT
GLOVE BIO SURGEON STRL SZ7 (GLOVE) ×2 IMPLANT
GLOVE BIOGEL PI IND STRL 7.5 (GLOVE) ×1 IMPLANT
GLOVE BIOGEL PI INDICATOR 7.5 (GLOVE) ×1
GOWN STRL REUS W/ TWL LRG LVL3 (GOWN DISPOSABLE) ×2 IMPLANT
GOWN STRL REUS W/TWL LRG LVL3 (GOWN DISPOSABLE) ×2
KIT BASIN OR (CUSTOM PROCEDURE TRAY) ×2 IMPLANT
KIT ROOM TURNOVER OR (KITS) ×2 IMPLANT
LIQUID BAND (GAUZE/BANDAGES/DRESSINGS) ×2 IMPLANT
MESH ULTRAPRO 3X6 7.6X15CM (Mesh General) ×2 IMPLANT
NEEDLE HYPO 25GX1X1/2 BEV (NEEDLE) ×2 IMPLANT
NS IRRIG 1000ML POUR BTL (IV SOLUTION) ×2 IMPLANT
PACK SURGICAL SETUP 50X90 (CUSTOM PROCEDURE TRAY) ×2 IMPLANT
PAD ARMBOARD 7.5X6 YLW CONV (MISCELLANEOUS) ×2 IMPLANT
PEN SKIN MARKING BROAD (MISCELLANEOUS) ×2 IMPLANT
PENCIL BUTTON HOLSTER BLD 10FT (ELECTRODE) ×2 IMPLANT
SPONGE LAP 18X18 X RAY DECT (DISPOSABLE) ×2 IMPLANT
SUT MNCRL AB 4-0 PS2 18 (SUTURE) ×2 IMPLANT
SUT VIC AB 0 CT1 18XCR BRD 8 (SUTURE) ×1 IMPLANT
SUT VIC AB 0 CT1 27 (SUTURE)
SUT VIC AB 0 CT1 27XBRD ANBCTR (SUTURE) IMPLANT
SUT VIC AB 0 CT1 8-18 (SUTURE) ×1
SUT VIC AB 2-0 CT1 27 (SUTURE) ×1
SUT VIC AB 2-0 CT1 TAPERPNT 27 (SUTURE) ×1 IMPLANT
SUT VIC AB 2-0 SH 18 (SUTURE) ×2 IMPLANT
SUT VIC AB 3-0 SH 27 (SUTURE) ×1
SUT VIC AB 3-0 SH 27XBRD (SUTURE) ×1 IMPLANT
SUT VICRYL AB 2 0 TIES (SUTURE) ×2 IMPLANT
SYR CONTROL 10ML LL (SYRINGE) ×2 IMPLANT
TOWEL OR 17X24 6PK STRL BLUE (TOWEL DISPOSABLE) ×2 IMPLANT
TOWEL OR 17X26 10 PK STRL BLUE (TOWEL DISPOSABLE) ×2 IMPLANT
TUBE CONNECTING 12X1/4 (SUCTIONS) IMPLANT
YANKAUER SUCT BULB TIP NO VENT (SUCTIONS) IMPLANT

## 2014-10-15 NOTE — Op Note (Signed)
Preoperative diagnosis: right inguinal hernia Postoperative diagnosis:indirect RIH Procedure: RIH repair with ultrapro patch Surgeon: Dr Serita Grammes Anesthesia: general with TAP block EBL: minimal Complications: none Drains: none Specimens: none Sponge and needle count correct Dispo to pacu stable  Indications: This is a 76 yom who presents with symptomatic RIH. We discussed open RIH repair with mesh.  Procedure: After informed consent was obtained the patient was taken to the operating room. He was given cefazolin and scds were in place. He was placed under general anesthesia without complication. His right groin was prepped and draped in the standard sterile surgical fashion. A timeout was performed.  I made a right groin incision. I ligated the superficial epigastric vein with vicryl ties.  I was able to identify the external oblique and incised it. I then encircled the cord with a penrose drain.He had an indirect hernia. This was reduced in its entirety. The floor was intact. I then placed a piece of ultrapro mesh and laid this flat. I made a T cut in the mesh and wrapped this around the cord. I then sutured the mesh with 2-0 vicryl several places to the pubic tubercle. I then tacked the cut ends together and to the muscle. I sutured the bottom portion of the mesh every half cm to the shelving edge. The lateral portion was laid flat under the oblique and this covered the defect well. I then obtained hemostasis. I then closed the external oblique with 2-0 vicryl Scarpas was closed with 3-0 vicryl The skin was closed with 4-0 monocryl and glue. He tolerated this well and was transferred to pacu stable.

## 2014-10-15 NOTE — Anesthesia Preprocedure Evaluation (Addendum)
Anesthesia Evaluation  Patient identified by MRN, date of birth, ID band Patient awake    Reviewed: Allergy & Precautions, NPO status , Patient's Chart, lab work & pertinent test results  History of Anesthesia Complications Negative for: history of anesthetic complications  Airway Mallampati: II  TM Distance: >3 FB Neck ROM: Full    Dental  (+) Teeth Intact, Chipped, Dental Advisory Given,    Pulmonary former smoker,    breath sounds clear to auscultation       Cardiovascular negative cardio ROS   Rhythm:Regular     Neuro/Psych negative neurological ROS  negative psych ROS   GI/Hepatic Neg liver ROS, GERD  ,Right inguinal hernia   Endo/Other  negative endocrine ROS  Renal/GU Renal diseaseRenal mass     Musculoskeletal   Abdominal   Peds  Hematology negative hematology ROS (+)   Anesthesia Other Findings   Reproductive/Obstetrics                            Anesthesia Physical Anesthesia Plan  ASA: II  Anesthesia Plan: General and Regional   Post-op Pain Management:    Induction: Intravenous  Airway Management Planned: LMA  Additional Equipment: None  Intra-op Plan:   Post-operative Plan: Extubation in OR  Informed Consent: I have reviewed the patients History and Physical, chart, labs and discussed the procedure including the risks, benefits and alternatives for the proposed anesthesia with the patient or authorized representative who has indicated his/her understanding and acceptance.   Dental advisory given  Plan Discussed with: CRNA and Surgeon  Anesthesia Plan Comments:         Anesthesia Quick Evaluation

## 2014-10-15 NOTE — Discharge Instructions (Signed)
[facility] ° °UMBILICAL OR INGUINAL HERNIA REPAIR: POST OP INSTRUCTIONS ° °Always review your discharge instruction sheet given to you by the facility where your surgery was performed. °IF YOU HAVE DISABILITY OR FAMILY LEAVE FORMS, YOU MUST BRING THEM TO THE OFFICE FOR PROCESSING.   °DO NOT GIVE THEM TO YOUR DOCTOR. ° °1. A  prescription for pain medication may be given to you upon discharge.  Take your pain medication as prescribed, if needed.  If narcotic pain medicine is not needed, then you may take acetaminophen (Tylenol), naprosyn (Alleve) or ibuprofen (Advil) as needed. °2. Take your usually prescribed medications unless otherwise directed. °3. If you need a refill on your pain medication, please contact your pharmacy.  They will contact our office to request authorization. Prescriptions will not be filled after 5 pm or on week-ends. °4. You should follow a light diet the first 24 hours after arrival home, such as soup and crackers, etc.  Be sure to include lots of fluids daily.  Resume your normal diet the day after surgery. °5. Most patients will experience some swelling and bruising around the umbilicus or in the groin and scrotum.  Ice packs and reclining will help.  Swelling and bruising can take several days to resolve.  °6. It is common to experience some constipation if taking pain medication after surgery.  Increasing fluid intake and taking a stool softener (such as Colace) will usually help or prevent this problem from occurring.  A mild laxative (Milk of Magnesia or Miralax) should be taken according to package directions if there are no bowel movements after 48 hours. °7. Unless discharge instructions indicate otherwise, you may remove your bandages 48 hours after surgery, and you may shower at that time.  You may have steri-strips (small skin tapes) in place directly over the incision.  These strips should be left on the skin for 7-10 days and will come off on their own.   If your surgeon used skin glue on the incision, you may shower in 24 hours.  The glue will flake off over the next 2-3 weeks.  Any sutures or staples will be removed at the office during your follow-up visit. °8. ACTIVITIES:  You may resume regular (light) daily activities beginning the next day--such as daily self-care, walking, climbing stairs--gradually increasing activities as tolerated.  You may have sexual intercourse when it is comfortable.  Refrain from any heavy lifting or straining until approved by your doctor. °a. You may drive when you are no longer taking prescription pain medication, you can comfortably wear a seatbelt, and you can safely maneuver your car and apply brakes. °b. RETURN TO WORK:  __________________________________________________________ °9. You should see your doctor in the office for a follow-up appointment approximately 2-3 weeks after your surgery.  Make sure that you call for this appointment within a day or two after you arrive home to insure a convenient appointment time. °10. OTHER INSTRUCTIONS:  __________________________________________________________________________________________________________________________________________________________________________________________  °WHEN TO CALL YOUR DOCTOR: °1. Fever over 101.0 °2. Inability to urinate °3. Nausea and/or vomiting °4. Extreme swelling or bruising °5. Continued bleeding from incision. °6. Increased pain, redness, or drainage from the incision ° °The clinic staff is available to answer your questions during regular business hours.  Please don’t hesitate to call and ask to speak to one of the nurses for clinical concerns.  If you have a medical emergency, go to the nearest emergency room or call 911.  A surgeon from [facility]   is always on call at the hospital ° ° °[address], [address], [place], [place]  [address] ? ° [address] [address], [place],    [address] °[phone] ? [phone] ? FAX  [phone] °Web site: [email or web address] ° ° °

## 2014-10-15 NOTE — Transfer of Care (Signed)
Immediate Anesthesia Transfer of Care Note  Patient: Jacob Beck  Procedure(s) Performed: Procedure(s): RIGHT INGUINAL HERNIA REPAIR WITH MESH (Right) INSERTION OF MESH (Right)  Patient Location: PACU  Anesthesia Type:General  Level of Consciousness: awake, alert  and oriented  Airway & Oxygen Therapy: Patient Spontanous Breathing and Patient connected to nasal cannula oxygen  Post-op Assessment: Report given to RN, Post -op Vital signs reviewed and stable and Patient moving all extremities X 4  Post vital signs: Reviewed and stable  Last Vitals:  Filed Vitals:   10/15/14 0904  BP: 153/65  Pulse: 72  Temp: 36.9 C  Resp: 18    Complications: No apparent anesthesia complications

## 2014-10-15 NOTE — H&P (Signed)
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 overall doing well and has repeat imaging in about four months. he also has a right groin bulge that is bothersome. has been present for about a month. this is preventing him from normal activities like walking in am now. also has had a couple episodes of significant pain. it does reduce when lying down.   Other Problems Elbert Ewings, CMA; 10/01/2014 8:52 AM) Cancer Inguinal Hernia  Past Surgical History Elbert Ewings, CMA; 10/01/2014 8:52 AM) Foot Surgery Right.  Diagnostic Studies History Elbert Ewings, [place]; 10/01/2014 8:52 AM) Colonoscopy 1-5 years ago  Allergies Elbert Ewings, CMA; 10/01/2014 8:52 AM) No Known Drug Allergies08/29/2016  Medication History Elbert Ewings, CMA; 10/01/2014 8:53 AM) FentaNYL (25MCG/HR Patch 72HR, Transdermal) Active. Hydrocodone-Acetaminophen (5-325MG  Tablet, Oral as needed) Active. Prochlorperazine Maleate (5MG  Tablet, Oral) Active. Votrient (200MG  Tablet, Oral) Active. Multiple Vitamin (Oral) Active. Medications Reconciled  Social History Elbert Ewings, [place]; 10/01/2014 8:52 AM) Alcohol use Remotely quit alcohol use. Caffeine use Carbonated beverages, Tea. Illicit drug use Remotely quit drug use. Tobacco use Former smoker.  Family History Elbert Ewings, [place]; 10/01/2014 8:52 AM) Arthritis Father. Diabetes Mellitus Father, Mother. Heart Disease Mother. Hypertension Brother, Father, Mother. Respiratory Condition Mother.  Review of Systems Elbert Ewings CMA; 10/01/2014 8:52 AM) General Present- Appetite Loss and Weight Loss. Not Present- Chills, Fatigue, Fever, Night Sweats and Weight Gain. Skin Not Present- Change in Wart/Mole, Dryness, Hives, Jaundice, New Lesions, Non-Healing Wounds, Rash and Ulcer. HEENT Not Present- Earache, Hearing Loss, Hoarseness, Nose Bleed, Oral Ulcers, Ringing in the Ears, Seasonal Allergies, Sinus Pain, Sore Throat, Visual Disturbances, Wears  glasses/contact lenses and Yellow Eyes. Respiratory Not Present- Bloody sputum, Chronic Cough, Difficulty Breathing, Snoring and Wheezing. Breast Not Present- Breast Mass, Breast Pain, Nipple Discharge and Skin Changes. Cardiovascular Not Present- Chest Pain, Difficulty Breathing Lying Down, Leg Cramps, Palpitations, Rapid Heart Rate, Shortness of Breath and Swelling of Extremities. Gastrointestinal Present- Abdominal Pain and Indigestion. Not Present- Bloating, Bloody Stool, Change in Bowel Habits, Chronic diarrhea, Constipation, Difficulty Swallowing, Excessive gas, Gets full quickly at meals, Hemorrhoids, Nausea, Rectal Pain and Vomiting. Male Genitourinary Not Present- Blood in Urine, Change in Urinary Stream, Frequency, Impotence, Nocturia, Painful Urination, Urgency and Urine Leakage. Musculoskeletal Present- Muscle Weakness. Not Present- Back Pain, Joint Pain, Joint Stiffness, Muscle Pain and Swelling of Extremities. Neurological Not Present- Decreased Memory, Fainting, Headaches, Numbness, Seizures, Tingling, Tremor, Trouble walking and Weakness. Psychiatric Not Present- Anxiety, Bipolar, Change in Sleep Pattern, Depression, Fearful and Frequent crying. Endocrine Present- Hair Changes. Not Present- Cold Intolerance, Excessive Hunger, Heat Intolerance, Hot flashes and New Diabetes.   Vitals Elbert Ewings CMA; 10/01/2014 8:53 AM) 10/01/2014 8:53 AM Weight: 161 lb Height: 72in Body Surface Area: 1.93 m Body Mass Index: 21.84 kg/m Temp.: 97.50F(Temporal)  Pulse: 67 (Regular)  BP: 128/70 (Sitting, Left Arm, Standard)    Physical Exam Rolm Bookbinder MD; 10/01/2014 9:24 AM) General Mental Status-Alert. Orientation-Oriented X3. Chest and Lung Exam Chest and lung exam reveals -on auscultation, normal breath sounds, no adventitious sounds and normal vocal resonance. Cardiovascular Cardiovascular examination reveals -normal heart sounds, regular rate and rhythm with no  murmurs. Abdomen Note: healed luq incision, bs present soft minimally tender, small rih that is reducible, no lih   Assessment & Plan Rolm Bookbinder MD; 10/01/2014 9:25 AM) INGUINAL HERNIA, RIGHT (550.90  K40.90) Story: RIH repair with mesh after discussion with Dr Alen Blew. this is symptomatic so I think reasonable. he would like to consider even in face  of rcc. We discussed observation versus repair. We discussed both laparoscopic and open inguinal hernia repairs. I described the procedure in detail. The patient was given educational material. Goals should be achieved with surgery. We discussed the usage of mesh and the rationale behind that. We went over the pathophysiology of inguinal hernia. We have elected to perform open inguinal hernia repair with mesh. We discussed the risks including bleeding, infection, recurrence, postoperative pain and chronic groin pain, testicular injury, urinary retention, numbness in groin and around incision.

## 2014-10-15 NOTE — Anesthesia Procedure Notes (Addendum)
Procedure Name: LMA Insertion Date/Time: 10/15/2014 11:16 AM Performed by: Gaylene Brooks Pre-anesthesia Checklist: Patient identified, Emergency Drugs available, Suction available, Patient being monitored and Timeout performed Patient Re-evaluated:Patient Re-evaluated prior to inductionOxygen Delivery Method: Circle system utilized Preoxygenation: Pre-oxygenation with 100% oxygen Intubation Type: IV induction Ventilation: Mask ventilation without difficulty LMA Size: 5.0 Number of attempts: 1 Placement Confirmation: positive ETCO2 and breath sounds checked- equal and bilateral Tube secured with: Tape Dental Injury: Teeth and Oropharynx as per pre-operative assessment    Anesthesia Regional Block:  TAP block  Pre-Anesthetic Checklist: ,, timeout performed, Correct Patient, Correct Site, Correct Laterality, Correct Procedure, Correct Position, risks and benefits discussed, surgical consent, pre-op evaluation,  At surgeon's request and post-op pain management  Laterality: Right  Prep: chloraprep       Needles:  Injection technique: Single-shot  Needle Type: Echogenic Needle          Additional Needles:  Procedures: ultrasound guided (picture in chart) TAP block Narrative:  Injection made incrementally with aspirations every 5 mL.  Performed by: Personally   Additional Notes: H+P and labs reviewed, risks and benefits discussed with patient, procedure tolerated well without complications

## 2014-10-15 NOTE — Progress Notes (Signed)
Sign out request made to Dr Ermalene Postin

## 2014-10-16 ENCOUNTER — Other Ambulatory Visit: Payer: Self-pay | Admitting: *Deleted

## 2014-10-16 ENCOUNTER — Encounter (HOSPITAL_COMMUNITY): Payer: Self-pay | Admitting: General Surgery

## 2014-10-16 DIAGNOSIS — N2889 Other specified disorders of kidney and ureter: Secondary | ICD-10-CM

## 2014-10-16 MED ORDER — PAZOPANIB HCL 200 MG PO TABS: ORAL_TABLET | ORAL | Status: DC

## 2014-10-16 NOTE — Anesthesia Postprocedure Evaluation (Signed)
  Anesthesia Post-op Note  Patient: Jacob Beck  Procedure(s) Performed: Procedure(s): RIGHT INGUINAL HERNIA REPAIR WITH MESH (Right) INSERTION OF MESH (Right)  Patient Location: PACU  Anesthesia Type:General and Regional  Level of Consciousness: awake  Airway and Oxygen Therapy: Patient Spontanous Breathing  Post-op Pain: mild  Post-op Assessment: Post-op Vital signs reviewed, Patient's Cardiovascular Status Stable, Respiratory Function Stable, Patent Airway, No signs of Nausea or vomiting and Pain level controlled              Post-op Vital Signs: Reviewed and stable  Last Vitals:  Filed Vitals:   10/15/14 1322  BP: 143/70  Pulse: 61  Temp:   Resp:     Complications: No apparent anesthesia complications

## 2014-10-17 ENCOUNTER — Other Ambulatory Visit: Payer: Self-pay | Admitting: *Deleted

## 2014-10-17 DIAGNOSIS — G8929 Other chronic pain: Secondary | ICD-10-CM

## 2014-10-17 MED ORDER — HYDROCODONE-ACETAMINOPHEN 5-325 MG PO TABS: 2.0000 | ORAL_TABLET | ORAL | Status: DC | PRN

## 2014-10-17 NOTE — Telephone Encounter (Signed)
Informed patient that prescription for Norco is ready for pickup. Patient verbalized understanding.

## 2014-10-26 ENCOUNTER — Telehealth: Payer: Self-pay | Admitting: *Deleted

## 2014-10-26 NOTE — Telephone Encounter (Signed)
Patient calling to say, while sitting or lying down, sometimes he takes a deep breath and doesn't feel like he is getting enough air. He wonders if he is congested? Denies fever or SOB on E. States he walks every day. Denies coughing, hemoptesis or sputum. Per dr Alen Blew, will watch for now, call if any new symptoms and dr Alen Blew will discuss at visit on 10/30/14. Patient and sister sharon verbalize understanding.

## 2014-10-26 NOTE — Telephone Encounter (Signed)
No note

## 2014-10-30 ENCOUNTER — Ambulatory Visit (HOSPITAL_BASED_OUTPATIENT_CLINIC_OR_DEPARTMENT_OTHER): Payer: Managed Care, Other (non HMO) | Admitting: Oncology

## 2014-10-30 ENCOUNTER — Telehealth: Payer: Self-pay | Admitting: Oncology

## 2014-10-30 ENCOUNTER — Other Ambulatory Visit (HOSPITAL_BASED_OUTPATIENT_CLINIC_OR_DEPARTMENT_OTHER): Payer: Managed Care, Other (non HMO)

## 2014-10-30 VITALS — BP 148/74 | HR 62 | Temp 98.1°F | Resp 18 | Ht 72.0 in | Wt 164.9 lb

## 2014-10-30 DIAGNOSIS — N2889 Other specified disorders of kidney and ureter: Secondary | ICD-10-CM

## 2014-10-30 DIAGNOSIS — D642 Secondary sideroblastic anemia due to drugs and toxins: Secondary | ICD-10-CM | POA: Diagnosis not present

## 2014-10-30 DIAGNOSIS — C649 Malignant neoplasm of unspecified kidney, except renal pelvis: Secondary | ICD-10-CM

## 2014-10-30 DIAGNOSIS — C799 Secondary malignant neoplasm of unspecified site: Secondary | ICD-10-CM | POA: Diagnosis not present

## 2014-10-30 LAB — COMPREHENSIVE METABOLIC PANEL (CC13)
ALT: 21 U/L (ref 0–55)
ANION GAP: 9 meq/L (ref 3–11)
AST: 27 U/L (ref 5–34)
Albumin: 3.6 g/dL (ref 3.5–5.0)
Alkaline Phosphatase: 90 U/L (ref 40–150)
BUN: 24.9 mg/dL (ref 7.0–26.0)
CHLORIDE: 103 meq/L (ref 98–109)
CO2: 29 meq/L (ref 22–29)
Calcium: 9.4 mg/dL (ref 8.4–10.4)
Creatinine: 1 mg/dL (ref 0.7–1.3)
EGFR: 78 mL/min/{1.73_m2} — AB (ref >90)
GLUCOSE: 125 mg/dL (ref 70–140)
POTASSIUM: 4.6 meq/L (ref 3.5–5.1)
SODIUM: 140 meq/L (ref 136–145)
TOTAL PROTEIN: 7.7 g/dL (ref 6.4–8.3)
Total Bilirubin: 0.67 mg/dL (ref 0.20–1.20)

## 2014-10-30 LAB — CBC WITH DIFFERENTIAL/PLATELET
BASO%: 0.7 % (ref 0.0–2.0)
Basophils Absolute: 0 10*3/uL (ref 0.0–0.1)
EOS ABS: 0.1 10*3/uL (ref 0.0–0.5)
EOS%: 2.3 % (ref 0.0–7.0)
HCT: 40.3 % (ref 38.4–49.9)
HGB: 13.4 g/dL (ref 13.0–17.1)
LYMPH%: 41.7 % (ref 14.0–49.0)
MCH: 33.1 pg (ref 27.2–33.4)
MCHC: 33.4 g/dL (ref 32.0–36.0)
MCV: 99.1 fL — AB (ref 79.3–98.0)
MONO#: 0.4 10*3/uL (ref 0.1–0.9)
MONO%: 7.2 % (ref 0.0–14.0)
NEUT%: 48.1 % (ref 39.0–75.0)
NEUTROS ABS: 2.8 10*3/uL (ref 1.5–6.5)
PLATELETS: 233 10*3/uL (ref 140–400)
RBC: 4.06 10*6/uL — AB (ref 4.20–5.82)
RDW: 15.3 % — ABNORMAL HIGH (ref 11.0–14.6)
WBC: 5.8 10*3/uL (ref 4.0–10.3)
lymph#: 2.4 10*3/uL (ref 0.9–3.3)

## 2014-10-30 MED ORDER — FENTANYL 25 MCG/HR TD PT72: 25.0000 ug | MEDICATED_PATCH | TRANSDERMAL | Status: DC

## 2014-10-30 NOTE — Telephone Encounter (Signed)
Gave and printed appt sched and avs fo rpt; for NOV  °

## 2014-10-30 NOTE — Progress Notes (Signed)
Hematology and Oncology Follow Up Visit  Jacob Beck [id number] Jan 31, 1951 64 y.o. 10/30/2014 9:13 AM   Principle Diagnosis: 64 year old gentleman with renal cell carcinoma diagnosed in April 2016. He presented with a large tumor on the left kidney measuring 15 cm and extending into the retroperitoneum causing proximal renal vein thrombosis.   Prior Therapy: Attempted surgical resection on 06/01/2014 that was unsuccessful given the size of tumor.  Current therapy: Votrient 800 mg daily started on 06/07/2014.  Interim History: Jacob Beck presents today for a follow-up visit. Since the last visit, he underwent a hernia repair operation 2 weeks ago and have tolerated it well. He is recovering well without any residual complications. Votrient was held for a week and now have resumed without any incident. He does report grade 1 diarrhea after reinitiating this medication. Marland KitchenHe continues to have some decreased appetite but overall is eating well. He has not reported any other complications related to this medication.  His pain appeared to be under reasonable control and rarely takes hydrocodone. He tolerates fentanyl patches without any issue.  He does not report any headaches, blurry vision, double vision, syncope or seizures. He does not report any sweats or weight loss since the last visit. He does not report any chest pain, palpitation, orthopnea, leg edema. He does not report any cough, hemoptysis, hematemesis. He does not report any early satiety or abdominal distention. He does report flank pain but no hematuria, dysuria, frequency, urgency or hesitancy. He does not report any skeletal pains to his back pain shoulder pain or hip pain. He does not report any lymphadenopathy or petechiae. Remainder of review of systems unremarkable.   Medications: I have reviewed the patient's current medications.  Current Outpatient Prescriptions  Medication Sig Dispense Refill  . fentaNYL (DURAGESIC - DOSED  MCG/HR) 25 MCG/HR patch Place 1 patch (25 mcg total) onto the skin every 3 (three) days. 10 patch 0  . HYDROcodone-acetaminophen (NORCO/VICODIN) 5-325 MG per tablet Take 2 tablets by mouth every 4 (four) hours as needed for moderate pain. 90 tablet 0  . Multiple Vitamins-Minerals (MULTIVITAMIN WITH MINERALS) tablet Take 1 tablet by mouth daily.    . pazopanib (VOTRIENT) 200 MG tablet Take 4 tablets (800 mg) by mouth daily on an empty stomach (at least 1 hour before or 2 hours after a meal). Swallow whole. Do not crush. 120 tablet 0  . prochlorperazine (COMPAZINE) 5 MG tablet Take 1 tablet (5 mg total) by mouth every 6 (six) hours as needed for nausea or vomiting. 30 tablet 0   No current facility-administered medications for this visit.     Allergies:  Allergies  Allergen Reactions  . Oxycodone Other (See Comments)    "halucinations"    Past Medical History, Surgical history, Social history, and Family History were reviewed and updated.   Physical Exam: Blood pressure 148/74, pulse 62, temperature 98.1 F (36.7 C), temperature source Oral, resp. rate 18, height 6' (1.829 m), weight 164 lb 14.4 oz (74.798 kg), SpO2 100 %. ECOG: 1 General appearance: alert and cooperative. Well-appearing gentleman not in any distress. Head: Normocephalic, without obvious abnormality no oral ulcers or lesions. Neck: no adenopathy Lymph nodes: Cervical, supraclavicular, and axillary nodes normal. Heart:regular rate and rhythm, S1, S2 normal, no murmur, click, rub or gallop Lung:chest clear, no wheezing, rales, normal symmetric air entry Abdomin: soft, non-tender, without masses or organomegaly no shifting dullness or ascites. EXT:no erythema, induration, or nodules   Lab Results: Lab Results  Component Value Date  WBC 5.8 10/30/2014   HGB 13.4 10/30/2014   HCT 40.3 10/30/2014   MCV 99.1* 10/30/2014   PLT 233 10/30/2014     Chemistry      Component Value Date/Time   NA 140 10/30/2014 0804    NA 140 10/15/2014 0932   K 4.6 10/30/2014 0804   K 3.9 10/15/2014 0932   CL 105 10/15/2014 0932   CO2 29 10/30/2014 0804   CO2 26 10/15/2014 0932   BUN 24.9 10/30/2014 0804   BUN 19 10/15/2014 0932   CREATININE 1.0 10/30/2014 0804   CREATININE 0.72 10/15/2014 0932      Component Value Date/Time   CALCIUM 9.4 10/30/2014 0804   CALCIUM 9.0 10/15/2014 0932   ALKPHOS 90 10/30/2014 0804   ALKPHOS 79 10/15/2014 0932   AST 27 10/30/2014 0804   AST 19 10/15/2014 0932   ALT 21 10/30/2014 0804   ALT 13* 10/15/2014 0932   BILITOT 0.67 10/30/2014 0804   BILITOT 0.5 10/15/2014 0932      Impression and Plan:   64 year old gentleman with the following issues:  1.Renal cell carcinoma presented with a large mass measuring 15.0 x 14.1 x 9.9 cm left renal mass extending off the upper pole of the left kidney into the retroperitoneum. He has also evidence of lymphadenopathy and small pulmonary nodules. He presented with abdominal pain and 20 pound weight loss. This is a biopsy proven to be renal cell carcinoma with a biopsy done on 05/04/2014. Attempted surgical resection for debulking purposes was unsuccessful.  He is currently on Votrient 800 mg daily and is tolerating that dose well. He did have few complications include grade 1 diarrhea. CT scan from 08/29/2014 showed no major changes. He has a large neoplasm arising from the left kidney have marginally decreased. No new lymphadenopathy noted.  The plan is to continue with the same dose and schedule and repeat imaging studies in November 2016.  2. Constipation: This have resolved at this time and currently has mild diarrhea. I continue to educate him about using anti-diarrheal medication if he develops more than 2 loose bowel movements everyday.  3. Abdominal pain: He will continue on fentanyl 25 g patch, to be changed every 3 days. He will use hydrocodone as needed. His pain is under excellent control. We might consider weaning him off  fentanyl patch in the near future.  4. Follow-up: Will be in one month to assess for any complications.   [place], MD 9/27/20169:13 AM

## 2014-11-12 ENCOUNTER — Other Ambulatory Visit: Payer: Self-pay | Admitting: Oncology

## 2014-11-20 ENCOUNTER — Other Ambulatory Visit: Payer: Self-pay | Admitting: Oncology

## 2014-12-04 ENCOUNTER — Ambulatory Visit (HOSPITAL_BASED_OUTPATIENT_CLINIC_OR_DEPARTMENT_OTHER): Payer: Managed Care, Other (non HMO) | Admitting: Oncology

## 2014-12-04 ENCOUNTER — Other Ambulatory Visit: Payer: Self-pay | Admitting: *Deleted

## 2014-12-04 ENCOUNTER — Telehealth: Payer: Self-pay | Admitting: Oncology

## 2014-12-04 ENCOUNTER — Other Ambulatory Visit (HOSPITAL_BASED_OUTPATIENT_CLINIC_OR_DEPARTMENT_OTHER): Payer: Managed Care, Other (non HMO)

## 2014-12-04 VITALS — BP 151/74 | HR 63 | Temp 98.5°F | Resp 20 | Ht 72.0 in | Wt 163.8 lb

## 2014-12-04 DIAGNOSIS — G4700 Insomnia, unspecified: Secondary | ICD-10-CM

## 2014-12-04 DIAGNOSIS — R109 Unspecified abdominal pain: Secondary | ICD-10-CM | POA: Diagnosis not present

## 2014-12-04 DIAGNOSIS — G2581 Restless legs syndrome: Secondary | ICD-10-CM

## 2014-12-04 DIAGNOSIS — R110 Nausea: Secondary | ICD-10-CM

## 2014-12-04 DIAGNOSIS — R591 Generalized enlarged lymph nodes: Secondary | ICD-10-CM

## 2014-12-04 DIAGNOSIS — C649 Malignant neoplasm of unspecified kidney, except renal pelvis: Secondary | ICD-10-CM

## 2014-12-04 DIAGNOSIS — N2889 Other specified disorders of kidney and ureter: Secondary | ICD-10-CM

## 2014-12-04 DIAGNOSIS — R918 Other nonspecific abnormal finding of lung field: Secondary | ICD-10-CM

## 2014-12-04 LAB — COMPREHENSIVE METABOLIC PANEL (CC13)
ALT: 21 U/L (ref 0–55)
ANION GAP: 10 meq/L (ref 3–11)
AST: 28 U/L (ref 5–34)
Albumin: 3.9 g/dL (ref 3.5–5.0)
Alkaline Phosphatase: 69 U/L (ref 40–150)
BUN: 24 mg/dL (ref 7.0–26.0)
CALCIUM: 10.1 mg/dL (ref 8.4–10.4)
CHLORIDE: 103 meq/L (ref 98–109)
CO2: 27 meq/L (ref 22–29)
Creatinine: 1 mg/dL (ref 0.7–1.3)
EGFR: 81 mL/min/{1.73_m2} — ABNORMAL LOW (ref >90)
Glucose: 93 mg/dl (ref 70–140)
POTASSIUM: 4.6 meq/L (ref 3.5–5.1)
Sodium: 140 mEq/L (ref 136–145)
Total Bilirubin: 0.51 mg/dL (ref 0.20–1.20)
Total Protein: 8.6 g/dL — ABNORMAL HIGH (ref 6.4–8.3)

## 2014-12-04 LAB — CBC WITH DIFFERENTIAL/PLATELET
BASO%: 0.6 % (ref 0.0–2.0)
BASOS ABS: 0 10*3/uL (ref 0.0–0.1)
EOS%: 2.7 % (ref 0.0–7.0)
Eosinophils Absolute: 0.1 10*3/uL (ref 0.0–0.5)
HEMATOCRIT: 45.2 % (ref 38.4–49.9)
HGB: 15.1 g/dL (ref 13.0–17.1)
LYMPH#: 1.8 10*3/uL (ref 0.9–3.3)
LYMPH%: 40.7 % (ref 14.0–49.0)
MCH: 35 pg — AB (ref 27.2–33.4)
MCHC: 33.4 g/dL (ref 32.0–36.0)
MCV: 104.7 fL — ABNORMAL HIGH (ref 79.3–98.0)
MONO#: 0.3 10*3/uL (ref 0.1–0.9)
MONO%: 7.6 % (ref 0.0–14.0)
NEUT#: 2.2 10*3/uL (ref 1.5–6.5)
NEUT%: 48.4 % (ref 39.0–75.0)
PLATELETS: 171 10*3/uL (ref 140–400)
RBC: 4.32 10*6/uL (ref 4.20–5.82)
RDW: 15.4 % — ABNORMAL HIGH (ref 11.0–14.6)
WBC: 4.5 10*3/uL (ref 4.0–10.3)

## 2014-12-04 MED ORDER — TEMAZEPAM 30 MG PO CAPS: 30.0000 mg | ORAL_CAPSULE | Freq: Every evening | ORAL | Status: DC | PRN

## 2014-12-04 MED ORDER — ONDANSETRON HCL 4 MG PO TABS: 4.0000 mg | ORAL_TABLET | Freq: Three times a day (TID) | ORAL | Status: DC | PRN

## 2014-12-04 MED ORDER — FENTANYL 25 MCG/HR TD PT72: 25.0000 ug | MEDICATED_PATCH | TRANSDERMAL | Status: DC

## 2014-12-04 NOTE — Telephone Encounter (Signed)
Gave and printed papt sched and avs fo rpt for DEC...gv barium

## 2014-12-04 NOTE — Progress Notes (Signed)
Hematology and Oncology Follow Up Visit  Jacob Beck [id number] [birth year] 64 y.o. 12/04/2014 8:54 AM   Principle Diagnosis: 64 year old gentleman with renal cell carcinoma diagnosed in April 2016. He presented with a large tumor on the left kidney measuring 15 cm and extending into the retroperitoneum causing proximal renal vein thrombosis.   Prior Therapy: Attempted surgical resection on 06/01/2014 that was unsuccessful given the size of tumor.  Current therapy: Votrient 800 mg daily started on 06/07/2014.  Interim History: [name] presents today for a follow-up visit with his wife. Since the last visit, he reports no new issues. He does report grade 1 nausea which compazine is note helping. He also reported insomnia at times. He continues to have some decreased appetite but overall is eating well and his weight is stable. He has not reported any other complications related to this medication.  His pain appeared to be under reasonable control and rarely takes any breakthrough medication for pain.He tolerates fentanyl patches without any issue.  He does not report any headaches, blurry vision, double vision, syncope or seizures. He does not report any sweats or weight loss since the last visit. He does not report any chest pain, palpitation, orthopnea, leg edema. He does not report any cough, hemoptysis, hematemesis. He does not report any early satiety or abdominal distention. He does report flank pain but no hematuria, dysuria, frequency, urgency or hesitancy. He does not report any skeletal pains to his back pain shoulder pain or hip pain. He does not report any lymphadenopathy or petechiae. Remainder of review of systems unremarkable.   Medications: I have reviewed the patient's current medications.  Current Outpatient Prescriptions  Medication Sig Dispense Refill  . fentaNYL (DURAGESIC - DOSED MCG/HR) 25 MCG/HR patch Place 1 patch (25 mcg total) onto the skin every 3 (three) days. 10  patch 0  . HYDROcodone-acetaminophen (NORCO/VICODIN) 5-325 MG per tablet Take 2 tablets by mouth every 4 (four) hours as needed for moderate pain. 90 tablet 0  . Multiple Vitamins-Minerals (MULTIVITAMIN WITH MINERALS) tablet Take 1 tablet by mouth daily.    . prochlorperazine (COMPAZINE) 5 MG tablet Take 1 tablet (5 mg total) by mouth every 6 (six) hours as needed for nausea or vomiting. 30 tablet 0  . VOTRIENT 200 MG tablet TAKE 4 TABLETS (800MG ) ORALLY ONCE DAILY ON AN EMPTY STOMACH (AT LEAST1 HOUR BEFORE OR 2 HOURS AFTER A MEAL). SWALLOW WHOLE. DO NOT CRUSH, 120 tablet 0  . ondansetron (ZOFRAN) 4 MG tablet Take 1 tablet (4 mg total) by mouth every 8 (eight) hours as needed for nausea or vomiting. 20 tablet 0  . ondansetron (ZOFRAN) 4 MG tablet Take 1 tablet (4 mg total) by mouth every 8 (eight) hours as needed for nausea or vomiting. 20 tablet 0  . temazepam (RESTORIL) 30 MG capsule Take 1 capsule (30 mg total) by mouth at bedtime as needed for sleep. 30 capsule 0   No current facility-administered medications for this visit.     Allergies:  Allergies  Allergen Reactions  . Oxycodone Other (See Comments)    "halucinations"    Past Medical History, Surgical history, Social history, and Family History were reviewed and updated.   Physical Exam: Blood pressure 151/74, pulse 63, temperature 98.5 F (36.9 C), temperature source Oral, resp. rate 20, height 6' (1.829 m), weight 163 lb 12.8 oz (74.299 kg), SpO2 100 %. ECOG: 1 General appearance: alert and cooperative. Appeared in no distress Head: Normocephalic, without obvious abnormality. Slight erythema noted  the inside of the oral pharynx. No thrush. Neck: no adenopathy Lymph nodes: Cervical, supraclavicular, and axillary nodes normal. Heart:regular rate and rhythm, S1, S2 normal, no murmur, click, rub or gallop Lung:chest clear, no wheezing, rales, normal symmetric air entry Abdomin: soft, non-tender, without masses or organomegaly  no rebound or guarding. EXT:no erythema, induration, or nodules   Lab Results: Lab Results  Component Value Date   WBC 4.5 12/04/2014   HGB 15.1 12/04/2014   HCT 45.2 12/04/2014   MCV 104.7* 12/04/2014   PLT 171 12/04/2014     Chemistry      Component Value Date/Time   NA 140 10/30/2014 0804   NA 140 10/15/2014 0932   K 4.6 10/30/2014 0804   K 3.9 10/15/2014 0932   CL 105 10/15/2014 0932   CO2 29 10/30/2014 0804   CO2 26 10/15/2014 0932   BUN 24.9 10/30/2014 0804   BUN 19 10/15/2014 0932   CREATININE 1.0 10/30/2014 0804   CREATININE 0.72 10/15/2014 0932      Component Value Date/Time   CALCIUM 9.4 10/30/2014 0804   CALCIUM 9.0 10/15/2014 0932   ALKPHOS 90 10/30/2014 0804   ALKPHOS 79 10/15/2014 0932   AST 27 10/30/2014 0804   AST 19 10/15/2014 0932   ALT 21 10/30/2014 0804   ALT 13* 10/15/2014 0932   BILITOT 0.67 10/30/2014 0804   BILITOT 0.5 10/15/2014 0932      Impression and Plan:   64 year old gentleman with the following issues:  1.Renal cell carcinoma presented with a large mass measuring 15.0 x 14.1 x 9.9 cm left renal mass extending off the upper pole of the left kidney into the retroperitoneum. He has also evidence of lymphadenopathy and small pulmonary nodules. He presented with abdominal pain and 20 pound weight loss. This is a biopsy proven to be renal cell carcinoma with a biopsy done on 05/04/2014. Attempted surgical resection for debulking purposes was unsuccessful.  He is currently on Votrient 800 mg daily and is tolerating that dose well. He has mild nausea but no other major complications. The plan is to continue with the same dose and schedule and repeat imaging studies before the next visit. Anticipate that will be a month from now on December 2016.    2. Constipation: This is no longer an issue. Also his diarrhea subsided.  3. Abdominal pain: He will continue on fentanyl 25 g patch, to be changed every 3 days. He will use hydrocodone as  needed. His pain is under excellent control. We might consider weaning him off fentanyl patch in the near future.  4. Nausea: Prescription for Zofran was given to the patient to use in addition to Compazine if needed to.  5. Insomnia and restless leg syndrome: I gave him prescription for Restoril to use as needed.  6. Follow-up: Will be in one month to assess for any complications.   [facility], MD 11/1/20168:54 AM

## 2014-12-14 ENCOUNTER — Telehealth: Payer: Self-pay | Admitting: Oncology

## 2014-12-14 NOTE — Telephone Encounter (Signed)
PER CENTRAL MOVED 12/6 LAB TO 12/5 WITH CT. SPOKE WITH PATIENT HE IS AWARE. PATIENT ASKED TO DISREGARD PREVIOUS VOICEMAIL FROM THIS MORNING.

## 2014-12-31 ENCOUNTER — Other Ambulatory Visit: Payer: Self-pay | Admitting: Oncology

## 2014-12-31 ENCOUNTER — Encounter: Payer: Self-pay | Admitting: Oncology

## 2014-12-31 DIAGNOSIS — N2889 Other specified disorders of kidney and ureter: Secondary | ICD-10-CM

## 2015-01-01 ENCOUNTER — Other Ambulatory Visit: Payer: Self-pay | Admitting: *Deleted

## 2015-01-01 DIAGNOSIS — N2889 Other specified disorders of kidney and ureter: Secondary | ICD-10-CM

## 2015-01-01 MED ORDER — FENTANYL 25 MCG/HR TD PT72
25.0000 ug | MEDICATED_PATCH | TRANSDERMAL | Status: DC
Start: 2015-01-01 — End: 2015-02-19

## 2015-01-01 NOTE — Telephone Encounter (Signed)
Spoke with patient, signed script for fentanyl left at front for patient p/u. patient notified

## 2015-01-04 ENCOUNTER — Encounter: Payer: Self-pay | Admitting: Oncology

## 2015-01-04 NOTE — Progress Notes (Unsigned)
Disability papers faxed to [employer] today aft MD signature.

## 2015-01-07 ENCOUNTER — Other Ambulatory Visit: Payer: Self-pay | Admitting: Oncology

## 2015-01-07 ENCOUNTER — Other Ambulatory Visit (HOSPITAL_BASED_OUTPATIENT_CLINIC_OR_DEPARTMENT_OTHER): Payer: Managed Care, Other (non HMO)

## 2015-01-07 ENCOUNTER — Ambulatory Visit (HOSPITAL_COMMUNITY)
Admission: RE | Admit: 2015-01-07 | Discharge: 2015-01-07 | Disposition: A | Discharge status: Home / Self Care | Payer: Managed Care, Other (non HMO) | Source: Ambulatory Visit | Attending: Oncology | Admitting: Oncology

## 2015-01-07 ENCOUNTER — Ambulatory Visit (HOSPITAL_COMMUNITY): Payer: Managed Care, Other (non HMO)

## 2015-01-07 ENCOUNTER — Encounter: Payer: Self-pay | Admitting: *Deleted

## 2015-01-07 DIAGNOSIS — C649 Malignant neoplasm of unspecified kidney, except renal pelvis: Secondary | ICD-10-CM | POA: Diagnosis not present

## 2015-01-07 DIAGNOSIS — R918 Other nonspecific abnormal finding of lung field: Secondary | ICD-10-CM | POA: Insufficient documentation

## 2015-01-07 DIAGNOSIS — C642 Malignant neoplasm of left kidney, except renal pelvis: Secondary | ICD-10-CM | POA: Diagnosis not present

## 2015-01-07 DIAGNOSIS — N289 Disorder of kidney and ureter, unspecified: Secondary | ICD-10-CM | POA: Insufficient documentation

## 2015-01-07 DIAGNOSIS — I823 Embolism and thrombosis of renal vein: Secondary | ICD-10-CM | POA: Diagnosis not present

## 2015-01-07 LAB — COMPREHENSIVE METABOLIC PANEL
ALT: 18 U/L (ref 0–55)
AST: 25 U/L (ref 5–34)
Albumin: 3.6 g/dL (ref 3.5–5.0)
Alkaline Phosphatase: 71 U/L (ref 40–150)
Anion Gap: 9 mEq/L (ref 3–11)
BUN: 30.7 mg/dL — AB (ref 7.0–26.0)
CHLORIDE: 104 meq/L (ref 98–109)
CO2: 28 meq/L (ref 22–29)
CREATININE: 0.9 mg/dL (ref 0.7–1.3)
Calcium: 9.8 mg/dL (ref 8.4–10.4)
EGFR: 85 mL/min/{1.73_m2} — ABNORMAL LOW (ref >90)
GLUCOSE: 88 mg/dL (ref 70–140)
Potassium: 4.9 mEq/L (ref 3.5–5.1)
Sodium: 140 mEq/L (ref 136–145)
TOTAL PROTEIN: 8 g/dL (ref 6.4–8.3)
Total Bilirubin: 0.5 mg/dL (ref 0.20–1.20)

## 2015-01-07 LAB — CBC WITH DIFFERENTIAL/PLATELET
BASO%: 0.5 % (ref 0.0–2.0)
Basophils Absolute: 0 10*3/uL (ref 0.0–0.1)
EOS%: 4.2 % (ref 0.0–7.0)
Eosinophils Absolute: 0.2 10*3/uL (ref 0.0–0.5)
HCT: 40 % (ref 38.4–49.9)
HGB: 13.4 g/dL (ref 13.0–17.1)
LYMPH#: 1.7 10*3/uL (ref 0.9–3.3)
LYMPH%: 46.2 % (ref 14.0–49.0)
MCH: 35.3 pg — ABNORMAL HIGH (ref 27.2–33.4)
MCHC: 33.4 g/dL (ref 32.0–36.0)
MCV: 105.6 fL — ABNORMAL HIGH (ref 79.3–98.0)
MONO#: 0.3 10*3/uL (ref 0.1–0.9)
MONO%: 8.5 % (ref 0.0–14.0)
NEUT%: 40.6 % (ref 39.0–75.0)
NEUTROS ABS: 1.5 10*3/uL (ref 1.5–6.5)
Platelets: 161 10*3/uL (ref 140–400)
RBC: 3.79 10*6/uL — AB (ref 4.20–5.82)
RDW: 15.2 % — ABNORMAL HIGH (ref 11.0–14.6)
WBC: 3.6 10*3/uL — AB (ref 4.0–10.3)

## 2015-01-07 MED ORDER — IOHEXOL 300 MG/ML  SOLN
100.0000 mL | Freq: Once | INTRAMUSCULAR | Status: AC | PRN
  Administered 2015-01-07: 100 mL via INTRAVENOUS

## 2015-01-08 ENCOUNTER — Other Ambulatory Visit: Payer: Managed Care, Other (non HMO)

## 2015-01-09 ENCOUNTER — Other Ambulatory Visit: Payer: Self-pay | Admitting: *Deleted

## 2015-01-09 ENCOUNTER — Telehealth: Payer: Self-pay | Admitting: Oncology

## 2015-01-09 ENCOUNTER — Ambulatory Visit (HOSPITAL_BASED_OUTPATIENT_CLINIC_OR_DEPARTMENT_OTHER): Payer: Managed Care, Other (non HMO) | Admitting: Oncology

## 2015-01-09 VITALS — BP 149/87 | HR 63 | Temp 97.8°F | Resp 18 | Ht 72.0 in | Wt 172.0 lb

## 2015-01-09 DIAGNOSIS — R110 Nausea: Secondary | ICD-10-CM

## 2015-01-09 DIAGNOSIS — R591 Generalized enlarged lymph nodes: Secondary | ICD-10-CM | POA: Diagnosis not present

## 2015-01-09 DIAGNOSIS — C642 Malignant neoplasm of left kidney, except renal pelvis: Secondary | ICD-10-CM | POA: Diagnosis not present

## 2015-01-09 DIAGNOSIS — C649 Malignant neoplasm of unspecified kidney, except renal pelvis: Secondary | ICD-10-CM

## 2015-01-09 DIAGNOSIS — R109 Unspecified abdominal pain: Secondary | ICD-10-CM

## 2015-01-09 DIAGNOSIS — K1370 Unspecified lesions of oral mucosa: Secondary | ICD-10-CM

## 2015-01-09 DIAGNOSIS — R918 Other nonspecific abnormal finding of lung field: Secondary | ICD-10-CM | POA: Diagnosis not present

## 2015-01-09 MED ORDER — ONDANSETRON HCL 4 MG PO TABS
4.0000 mg | ORAL_TABLET | Freq: Three times a day (TID) | ORAL | Status: DC | PRN
Start: 2015-01-09 — End: 2015-04-03

## 2015-01-09 NOTE — Telephone Encounter (Signed)
Gave and printd appt sched and avs for pt for Jan 2017 °

## 2015-01-09 NOTE — Progress Notes (Signed)
Hematology and Oncology Follow Up Visit  KAISON PAS [id number] Oct 25, 1950 64 y.o. 01/09/2015 9:29 AM   Principle Diagnosis: 64 year old gentleman with renal cell carcinoma diagnosed in April 2016. He presented with a large tumor on the left kidney measuring 15 cm and extending into the retroperitoneum causing proximal renal vein thrombosis.   Prior Therapy: Attempted surgical resection on 06/01/2014 that was unsuccessful given the size of tumor.  Current therapy: Votrient 800 mg daily started on 06/07/2014.  Interim History: Mr. Decandia presents today for a follow-up visit with his wife. Since the last visit, he continues to improve slowly over a period of time. He does report grade 1 nausea which Zofran have helped his symptoms. His appetite have been reasonable and have gained some more weight. His performance status and activity level have not dramatically changed.  He continues to be on Votrient and have tolerated it well. He does report some occasional oral ulcers and oral pain that resolves rather quickly. It has not interfered with his ability to swallow or eat at this time. No other complications noted from this medication.  His pain appeared to be under reasonable control and rarely takes any breakthrough medication for pain. He tolerates fentanyl patches without any problems with insomnia alteration of mental status.  He does not report any headaches, blurry vision, double vision, syncope or seizures. He does not report any sweats or weight loss since the last visit. He does not report any chest pain, palpitation, orthopnea, leg edema. He does not report any cough, hemoptysis, hematemesis. He does not report any early satiety or abdominal distention. He does report flank pain but no hematuria, dysuria, frequency, urgency or hesitancy. He does not report any skeletal pains to his back pain shoulder pain or hip pain. He does not report any lymphadenopathy or petechiae. Remainder of  review of systems unremarkable.   Medications: I have reviewed the patient's current medications.  Current Outpatient Prescriptions  Medication Sig Dispense Refill  . fentaNYL (DURAGESIC - DOSED MCG/HR) 25 MCG/HR patch Place 1 patch (25 mcg total) onto the skin every 3 (three) days. 10 patch 0  . HYDROcodone-acetaminophen (NORCO/VICODIN) 5-325 MG per tablet Take 2 tablets by mouth every 4 (four) hours as needed for moderate pain. 90 tablet 0  . Multiple Vitamins-Minerals (MULTIVITAMIN WITH MINERALS) tablet Take 1 tablet by mouth daily.    . ondansetron (ZOFRAN) 4 MG tablet Take 1 tablet (4 mg total) by mouth every 8 (eight) hours as needed for nausea or vomiting. 20 tablet 0  . prochlorperazine (COMPAZINE) 5 MG tablet Take 1 tablet (5 mg total) by mouth every 6 (six) hours as needed for nausea or vomiting. 30 tablet 0  . temazepam (RESTORIL) 30 MG capsule Take 1 capsule (30 mg total) by mouth at bedtime as needed for sleep. 30 capsule 0  . VOTRIENT 200 MG tablet TAKE 4 TABLETS (800MG ) ORALLY ONCE DAILY ON AN EMPTY STOMACH (AT LEAST1 HOUR BEFORE OR 2 HOURS AFTER A MEAL). SWALLOW WHOLE. DO NOT CRUSH, 120 tablet 0   No current facility-administered medications for this visit.     Allergies:  Allergies  Allergen Reactions  . Oxycodone Other (See Comments)    "halucinations"    Past Medical History, Surgical history, Social history, and Family History were reviewed and updated.   Physical Exam: Blood pressure 149/87, pulse 63, temperature 97.8 F (36.6 C), temperature source Oral, resp. rate 18, height 6' (1.829 m), weight 172 lb (78.019 kg), SpO2 100 %. ECOG: 1  General appearance: alert and cooperative. Well-appearing gentleman. Head: Normocephalic, without obvious abnormality. No oral ulcers or lesions. No thrush. Neck: no adenopathy Lymph nodes: Cervical, supraclavicular, and axillary nodes normal. Heart:regular rate and rhythm, S1, S2 normal, no murmur, click, rub or  gallop Lung:chest clear, no wheezing, rales, normal symmetric air entry Abdomin: soft, non-tender, without masses or organomegaly no shifting dullness or ascites. EXT:no erythema, induration, or nodules   Lab Results: Lab Results  Component Value Date   WBC 3.6* 01/07/2015   HGB 13.4 01/07/2015   HCT 40.0 01/07/2015   MCV 105.6* 01/07/2015   PLT 161 01/07/2015     Chemistry      Component Value Date/Time   NA 140 01/07/2015 0933   NA 140 10/15/2014 0932   K 4.9 01/07/2015 0933   K 3.9 10/15/2014 0932   CL 105 10/15/2014 0932   CO2 28 01/07/2015 0933   CO2 26 10/15/2014 0932   BUN 30.7* 01/07/2015 0933   BUN 19 10/15/2014 0932   CREATININE 0.9 01/07/2015 0933   CREATININE 0.72 10/15/2014 0932      Component Value Date/Time   CALCIUM 9.8 01/07/2015 0933   CALCIUM 9.0 10/15/2014 0932   ALKPHOS 71 01/07/2015 0933   ALKPHOS 79 10/15/2014 0932   AST 25 01/07/2015 0933   AST 19 10/15/2014 0932   ALT 18 01/07/2015 0933   ALT 13* 10/15/2014 0932   BILITOT 0.50 01/07/2015 0933   BILITOT 0.5 10/15/2014 0932     EXAM: CT CHEST WITH CONTRAST  CT ABDOMEN AND PELVIS WITH AND WITHOUT CONTRAST  CT PELVIS WITH CONTRAST  TECHNIQUE: Multidetector CT imaging of the chest and pelvis was performed during intravenous contrast administration. Multidetector CT imaging of the abdomen was performed following the standard protocol before and during bolus administration of intravenous contrast.  CONTRAST: 125mL OMNIPAQUE IOHEXOL 300 MG/ML SOLN  COMPARISON: CT 08/30/2014  FINDINGS: CT CHEST FINDINGS  Mediastinum/Nodes: No axillary or supraclavicular lymphadenopathy. No mediastinal hilar adenopathy. No pericardial fluid. No central pulmonary embolism. Esophagus normal.  Lungs/Pleura:  3 mm nodule in the RIGHT middle lobe (image 49, series 8) is increased from 1 mm on prior. Adjacent 5 mm nodule image 48, series 8 is unchanged.  5 mm nodule RIGHT middle lobe on  image 65, series 8 is unchanged.  Ground-glass nodule in the superior aspect of the RIGHT lower lobe measures 15 mm compared to 13 mm.  No new pulmonary nodules identified.  Musculoskeletal: No aggressive osseous lesion  CT ABDOMEN PELVIS FINDINGS  Hepatobiliary: No focal hepatic lesion. Gallbladder normal  Pancreas: Normal pancreas.  Spleen: Normal spleen.  Adrenals/Urinary Tract: Normal adrenal glands.  The irregular lobular mixed density mass extending from upper pole of the LEFT kidney measures 10.7 by 8.6 cm in axial dimension compared to 11.3 by 8.8 cm. Lesion measures 10.5 cm in craniocaudad dimension compared to 11.2 cm.  The large LEFT renal mass is contained within the para renal fascia.  Again demonstrated tumor thrombus expanding the LEFT renal vein to 20 mm (image 117, series 6) not changed. This tumor thrombus extends to medially to the aorta. No evidence of extension to the IVC. IVC is relatively collapsed. The heart appears normal.  There is no enhancing lesion identified within the RIGHT renal cortex. There several low-density lesions within the cortex seen on delayed imaging measuring 10 mm or less which cannot be fully characterize. No filling defects the collecting systems or ureters.  Stomach/Bowel: Stomach, small bowel, appendix, and cecum are normal.  Moderate volume stool throughout the colon without obstructing lesion.  Vascular/Lymphatic: Abdominal aorta is normal caliber. Several small LEFT periaortic lymph nodes not changed. Venous collaterals LEFT of the aorta.  Reproductive: Prostate normal.  Other: No peritoneal disease.  Musculoskeletal: No aggressive osseous lesion.  IMPRESSION: Chest Impression:  1. Essentially stable small RIGHT pulmonary nodules can 1 ground-glass nodule. Recommend attention on follow-up. 2. No mediastinal adenopathy.  Abdomen / Pelvis Impression:  1. Minimal decrease in volume of  large irregular d renal cell carcinoma extending from the upper pole of the LEFT kidney. Tumor appears contained within the perirenal. 2. 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 No extension the IVC. 3. No clear evidence of renal cell carcinoma of the RIGHT kidney. Several small cortical hypodense lesions are too small to characterize. 4. No clear evidence of metastatic adenopathy in retroperitoneum. Multiple venous collaterals are present.    Impression and Plan:   64 year old gentleman with the following issues:  1.Renal cell carcinoma presented with a large mass measuring 15.0 x 14.1 x 9.9 cm left renal mass extending off the upper pole of the left kidney into the retroperitoneum. He has also evidence of lymphadenopathy and small pulmonary nodules. He presented with abdominal pain and 20 pound weight loss. This is a biopsy proven to be renal cell carcinoma with a biopsy done on 05/04/2014. Attempted surgical resection for debulking purposes was unsuccessful.  He is currently on Votrient 800 mg daily and is tolerating that dose well. CT scan on 01/07/2015 showed relatively stable disease and some minor decrease in the size of his large left kidney mass. The plan is to continue the same dose and schedule and repeat imaging studies in 4 months. Unless there is a dramatic decrease in the size of this tumor, surgical resection will not be entertained at this time.    2. Constipation: No longer an issue at this time. No diarrhea or other changes in his bowel habits.  3. Abdominal pain: He will continue on fentanyl 25 g patch, to be changed every 3 days. He has rarely used any breakthrough pain medication and I advised him to try to go without the fentanyl patch for few days cc needs it at all at this time. He is willing to try that in the near future.  4. Nausea: Prescription for Zofran refilled today.  5. Mouth sores: He is using over-the-counter  preparations to help with that and seems to be manageable. A prescription will be given to the patient if this becomes an issue in the future. Adjuvant mouthwash as well as steroid rinses could be an option.  6. Follow-up: Will be in 6 weeks.   [facility], MD 12/7/20169:29 AM

## 2015-01-14 ENCOUNTER — Other Ambulatory Visit: Payer: Self-pay | Admitting: Oncology

## 2015-01-15 ENCOUNTER — Other Ambulatory Visit (HOSPITAL_COMMUNITY): Payer: Managed Care, Other (non HMO)

## 2015-02-19 ENCOUNTER — Telehealth: Payer: Self-pay | Admitting: Oncology

## 2015-02-19 ENCOUNTER — Other Ambulatory Visit (HOSPITAL_BASED_OUTPATIENT_CLINIC_OR_DEPARTMENT_OTHER): Payer: Managed Care, Other (non HMO)

## 2015-02-19 ENCOUNTER — Ambulatory Visit (HOSPITAL_BASED_OUTPATIENT_CLINIC_OR_DEPARTMENT_OTHER): Payer: Managed Care, Other (non HMO) | Admitting: Oncology

## 2015-02-19 VITALS — BP 144/70 | HR 72 | Temp 97.9°F | Resp 18 | Ht 72.0 in | Wt 173.2 lb

## 2015-02-19 DIAGNOSIS — R918 Other nonspecific abnormal finding of lung field: Secondary | ICD-10-CM | POA: Diagnosis not present

## 2015-02-19 DIAGNOSIS — C642 Malignant neoplasm of left kidney, except renal pelvis: Secondary | ICD-10-CM

## 2015-02-19 DIAGNOSIS — K1370 Unspecified lesions of oral mucosa: Secondary | ICD-10-CM | POA: Diagnosis not present

## 2015-02-19 DIAGNOSIS — R197 Diarrhea, unspecified: Secondary | ICD-10-CM

## 2015-02-19 DIAGNOSIS — C649 Malignant neoplasm of unspecified kidney, except renal pelvis: Secondary | ICD-10-CM

## 2015-02-19 DIAGNOSIS — G8929 Other chronic pain: Secondary | ICD-10-CM

## 2015-02-19 LAB — CBC WITH DIFFERENTIAL/PLATELET
BASO%: 0.3 % (ref 0.0–2.0)
BASOS ABS: 0 10*3/uL (ref 0.0–0.1)
EOS%: 2.7 % (ref 0.0–7.0)
Eosinophils Absolute: 0.1 10*3/uL (ref 0.0–0.5)
HEMATOCRIT: 41.2 % (ref 38.4–49.9)
HGB: 13.8 g/dL (ref 13.0–17.1)
LYMPH#: 1.2 10*3/uL (ref 0.9–3.3)
LYMPH%: 32.7 % (ref 14.0–49.0)
MCH: 34.8 pg — AB (ref 27.2–33.4)
MCHC: 33.4 g/dL (ref 32.0–36.0)
MCV: 104.3 fL — ABNORMAL HIGH (ref 79.3–98.0)
MONO#: 0.3 10*3/uL (ref 0.1–0.9)
MONO%: 8.6 % (ref 0.0–14.0)
NEUT#: 2.1 10*3/uL (ref 1.5–6.5)
NEUT%: 55.7 % (ref 39.0–75.0)
PLATELETS: 164 10*3/uL (ref 140–400)
RBC: 3.95 10*6/uL — ABNORMAL LOW (ref 4.20–5.82)
RDW: 14.8 % — ABNORMAL HIGH (ref 11.0–14.6)
WBC: 3.8 10*3/uL — ABNORMAL LOW (ref 4.0–10.3)

## 2015-02-19 LAB — COMPREHENSIVE METABOLIC PANEL
ALK PHOS: 95 U/L (ref 40–150)
ALT: 22 U/L (ref 0–55)
ANION GAP: 10 meq/L (ref 3–11)
AST: 27 U/L (ref 5–34)
Albumin: 3.7 g/dL (ref 3.5–5.0)
BILIRUBIN TOTAL: 0.58 mg/dL (ref 0.20–1.20)
BUN: 22.2 mg/dL (ref 7.0–26.0)
CALCIUM: 9.7 mg/dL (ref 8.4–10.4)
CHLORIDE: 100 meq/L (ref 98–109)
CO2: 30 mEq/L — ABNORMAL HIGH (ref 22–29)
CREATININE: 1.1 mg/dL (ref 0.7–1.3)
EGFR: 74 mL/min/{1.73_m2} — AB (ref >90)
Glucose: 125 mg/dl (ref 70–140)
POTASSIUM: 4.1 meq/L (ref 3.5–5.1)
Sodium: 141 mEq/L (ref 136–145)
Total Protein: 8.1 g/dL (ref 6.4–8.3)

## 2015-02-19 MED ORDER — HYDROCODONE-ACETAMINOPHEN 5-325 MG PO TABS: 2.0000 | ORAL_TABLET | ORAL | Status: DC | PRN

## 2015-02-19 NOTE — Telephone Encounter (Signed)
per pof to sch pt appt-gave pt copy of avs °

## 2015-02-19 NOTE — Progress Notes (Signed)
Hematology and Oncology Follow Up Visit  LETRELL METZ [id number] [birth year] 66 y.o. 02/19/2015 9:31 AM   Principle Diagnosis: 65 year old gentleman with renal cell carcinoma diagnosed in April 2016. He presented with a large tumor on the left kidney measuring 15 cm and extending into the retroperitoneum causing proximal renal vein thrombosis.   Prior Therapy: Attempted surgical resection on 06/01/2014 that was unsuccessful given the size of tumor.  Current therapy: Votrient 800 mg daily started on 06/07/2014.  Interim History: Mr. Bozard presents today for a follow-up visit with his wife. Since the last visit, he reports no recent complaints. His pain has continuously improved and have discontinued fentanyl patch. He uses hydrocodone as needed and his use have also decreased and using 1 or 2 tablets a day.   He continues to be on Votrient and have tolerated it well. He denied any GI complications such as nausea, abdominal pain or diarrhea. His performance status remains excellent and activity levels continue to improve.   He does not report any headaches, blurry vision, double vision, syncope or seizures. He does not report any sweats or weight loss since the last visit. He does not report any chest pain, palpitation, orthopnea, leg edema. He does not report any cough, hemoptysis, hematemesis. He does not report any early satiety or abdominal distention. He does report flank pain but no hematuria, dysuria, frequency, urgency or hesitancy. He does not report any skeletal pains to his back pain shoulder pain or hip pain. He does not report any lymphadenopathy or petechiae. Remainder of review of systems unremarkable.   Medications: I have reviewed the patient's current medications.  Current Outpatient Prescriptions  Medication Sig Dispense Refill  . HYDROcodone-acetaminophen (NORCO/VICODIN) 5-325 MG tablet Take 2 tablets by mouth every 4 (four) hours as needed for moderate pain. 90 tablet 0  .  Multiple Vitamins-Minerals (MULTIVITAMIN WITH MINERALS) tablet Take 1 tablet by mouth daily.    . ondansetron (ZOFRAN) 4 MG tablet Take 1 tablet (4 mg total) by mouth every 8 (eight) hours as needed for nausea or vomiting. 20 tablet 0  . prochlorperazine (COMPAZINE) 5 MG tablet Take 1 tablet (5 mg total) by mouth every 6 (six) hours as needed for nausea or vomiting. 30 tablet 0  . temazepam (RESTORIL) 30 MG capsule Take 1 capsule (30 mg total) by mouth at bedtime as needed for sleep. 30 capsule 0  . VOTRIENT 200 MG tablet TAKE 4 TABLETS (800MG ) ORALLY ONCE DAILY ON AN EMPTY STOMACH (AT LEAST1 HOUR BEFORE OR 2 HOURS AFTER A MEAL). SWALLOW WHOLE. DO NOT CRUSH, 120 tablet 0  . VOTRIENT 200 MG tablet TAKE 4 TABLETS (800MG ) ORALLY ONCE DAILY ON AN EMPTY STOMACH (AT LEAST1 HOUR BEFORE OR 2 HOURS AFTER A MEAL). SWALLOW WHOLE. DO NOT CRUSH, 120 tablet 0   No current facility-administered medications for this visit.     Allergies:  Allergies  Allergen Reactions  . Oxycodone Other (See Comments)    "halucinations"    Past Medical History, Surgical history, Social history, and Family History were reviewed and updated.   Physical Exam: Blood pressure 144/70, pulse 72, temperature 97.9 F (36.6 C), temperature source Oral, resp. rate 18, height 6' (1.829 m), weight 173 lb 3.2 oz (78.563 kg), SpO2 99 %. ECOG: 1 General appearance: alert and cooperative. Appeared without distress. Head: Normocephalic, without obvious abnormality. No oral thrush. Neck: no adenopathy no thyroid masses. Lymph nodes: Cervical, supraclavicular, and axillary nodes normal. Heart:regular rate and rhythm, S1, S2 normal, no murmur,  click, rub or gallop Lung:chest clear, no wheezing, rales, normal symmetric air entry Abdomin: soft, non-tender, without masses or organomegaly no shifting dullness or ascites. EXT:no erythema, induration, or nodules   Lab Results: Lab Results  Component Value Date   WBC 3.8* 02/19/2015    HGB 13.8 02/19/2015   HCT 41.2 02/19/2015   MCV 104.3* 02/19/2015   PLT 164 02/19/2015     Chemistry      Component Value Date/Time   NA 140 01/07/2015 0933   NA 140 10/15/2014 0932   K 4.9 01/07/2015 0933   K 3.9 10/15/2014 0932   CL 105 10/15/2014 0932   CO2 28 01/07/2015 0933   CO2 26 10/15/2014 0932   BUN 30.7* 01/07/2015 0933   BUN 19 10/15/2014 0932   CREATININE 0.9 01/07/2015 0933   CREATININE 0.72 10/15/2014 0932      Component Value Date/Time   CALCIUM 9.8 01/07/2015 0933   CALCIUM 9.0 10/15/2014 0932   ALKPHOS 71 01/07/2015 0933   ALKPHOS 79 10/15/2014 0932   AST 25 01/07/2015 0933   AST 19 10/15/2014 0932   ALT 18 01/07/2015 0933   ALT 13* 10/15/2014 0932   BILITOT 0.50 01/07/2015 0933   BILITOT 0.5 10/15/2014 0932      Impression and Plan:   65 year old gentleman with the following issues:  1.Renal cell carcinoma presented with a large mass measuring 15.0 x 14.1 x 9.9 cm left renal mass extending off the upper pole of the left kidney into the retroperitoneum. He has also evidence of lymphadenopathy and small pulmonary nodules. He presented with abdominal pain and 20 pound weight loss. This is a biopsy proven to be renal cell carcinoma with a biopsy done on 05/04/2014. Attempted surgical resection for debulking purposes was unsuccessful.  He is currently on Votrient 800 mg daily and is tolerating that dose well. CT scan on 01/07/2015 showed relatively stable disease and some minor decrease in the size of his large left kidney mass. The plan is to continue the same dose and schedule and repeat imaging studies in April 2017. He continues to tolerate this medication and the duration of therapy will be in definite last his tumor had been reduced enough to have surgical resection.  2. Diarrhea: Very limited at this time and manageable. Instructions to combat the side effect was discussed today.  3. Abdominal pain: Doing better off fentanyl with hydrocodone as needed  only.  4. Nausea: Prescription for Zofran refilled previously had nausea seems to be controlled.  5. Mouth sores: He is using over-the-counter preparations to help with that and seems to be manageable. This have improved since the last visit.  6. The liver function surveillance: His LFTs continue to be normal despite being on Votrient. We'll continue to monitor this with every visit.  7. Follow-up: Will be in 6 weeks.   [facility], MD 1/17/20179:31 AM

## 2015-03-11 ENCOUNTER — Other Ambulatory Visit: Payer: Self-pay | Admitting: Oncology

## 2015-04-01 ENCOUNTER — Encounter: Payer: Self-pay | Admitting: *Deleted

## 2015-04-01 ENCOUNTER — Telehealth: Payer: Self-pay | Admitting: *Deleted

## 2015-04-01 NOTE — Telephone Encounter (Signed)
Voicemail: "This is Yoandri Anderer calling for spouse Jacob Beck.  He has blood in his urine now and dark stools.  I need to talk with Dr. Alen Blew or is nurse TODAY."  Cal forwarded.  This nurse called.  Ivin Booty reports she has spoken with [facility] and will talk tomorrow.

## 2015-04-02 ENCOUNTER — Encounter: Payer: Self-pay | Admitting: *Deleted

## 2015-04-02 NOTE — Progress Notes (Signed)
Spoke with patient. States his urine is somewhat dark today, not grossly bloody. He will keep regularly scheduled appt with dr Alen Blew tomorrow to evaluate this. He understands if he should have Yochim bleeding, he is to report to the E.D.

## 2015-04-03 ENCOUNTER — Telehealth: Payer: Self-pay | Admitting: Oncology

## 2015-04-03 ENCOUNTER — Other Ambulatory Visit: Payer: Self-pay | Admitting: *Deleted

## 2015-04-03 ENCOUNTER — Ambulatory Visit (HOSPITAL_BASED_OUTPATIENT_CLINIC_OR_DEPARTMENT_OTHER): Payer: Managed Care, Other (non HMO) | Admitting: Oncology

## 2015-04-03 ENCOUNTER — Other Ambulatory Visit (HOSPITAL_BASED_OUTPATIENT_CLINIC_OR_DEPARTMENT_OTHER): Payer: Managed Care, Other (non HMO)

## 2015-04-03 VITALS — BP 142/76 | HR 64 | Temp 97.8°F | Resp 18 | Ht 72.0 in | Wt 173.3 lb

## 2015-04-03 DIAGNOSIS — C649 Malignant neoplasm of unspecified kidney, except renal pelvis: Secondary | ICD-10-CM

## 2015-04-03 DIAGNOSIS — R197 Diarrhea, unspecified: Secondary | ICD-10-CM | POA: Diagnosis not present

## 2015-04-03 DIAGNOSIS — R110 Nausea: Secondary | ICD-10-CM | POA: Diagnosis not present

## 2015-04-03 DIAGNOSIS — R52 Pain, unspecified: Secondary | ICD-10-CM

## 2015-04-03 DIAGNOSIS — G8929 Other chronic pain: Secondary | ICD-10-CM

## 2015-04-03 DIAGNOSIS — C642 Malignant neoplasm of left kidney, except renal pelvis: Secondary | ICD-10-CM

## 2015-04-03 DIAGNOSIS — D642 Secondary sideroblastic anemia due to drugs and toxins: Secondary | ICD-10-CM

## 2015-04-03 LAB — CBC WITH DIFFERENTIAL/PLATELET
BASO%: 0.4 % (ref 0.0–2.0)
BASOS ABS: 0 10*3/uL (ref 0.0–0.1)
EOS ABS: 0.1 10*3/uL (ref 0.0–0.5)
EOS%: 2.6 % (ref 0.0–7.0)
HEMATOCRIT: 41.5 % (ref 38.4–49.9)
HEMOGLOBIN: 13.5 g/dL (ref 13.0–17.1)
LYMPH#: 1.6 10*3/uL (ref 0.9–3.3)
LYMPH%: 33.5 % (ref 14.0–49.0)
MCH: 33.5 pg — AB (ref 27.2–33.4)
MCHC: 32.6 g/dL (ref 32.0–36.0)
MCV: 102.6 fL — ABNORMAL HIGH (ref 79.3–98.0)
MONO#: 0.4 10*3/uL (ref 0.1–0.9)
MONO%: 7.2 % (ref 0.0–14.0)
NEUT#: 2.7 10*3/uL (ref 1.5–6.5)
NEUT%: 56.3 % (ref 39.0–75.0)
Platelets: 191 10*3/uL (ref 140–400)
RBC: 4.04 10*6/uL — ABNORMAL LOW (ref 4.20–5.82)
RDW: 15 % — AB (ref 11.0–14.6)
WBC: 4.9 10*3/uL (ref 4.0–10.3)

## 2015-04-03 LAB — COMPREHENSIVE METABOLIC PANEL
ALBUMIN: 3.5 g/dL (ref 3.5–5.0)
ALT: 20 U/L (ref 0–55)
AST: 23 U/L (ref 5–34)
Alkaline Phosphatase: 79 U/L (ref 40–150)
Anion Gap: 9 mEq/L (ref 3–11)
BILIRUBIN TOTAL: 0.49 mg/dL (ref 0.20–1.20)
BUN: 31.2 mg/dL — AB (ref 7.0–26.0)
CALCIUM: 9.9 mg/dL (ref 8.4–10.4)
CO2: 30 mEq/L — ABNORMAL HIGH (ref 22–29)
CREATININE: 1 mg/dL (ref 0.7–1.3)
Chloride: 103 mEq/L (ref 98–109)
EGFR: 80 mL/min/{1.73_m2} — ABNORMAL LOW (ref >90)
Glucose: 87 mg/dl (ref 70–140)
POTASSIUM: 4.8 meq/L (ref 3.5–5.1)
Sodium: 141 mEq/L (ref 136–145)
Total Protein: 8 g/dL (ref 6.4–8.3)

## 2015-04-03 MED ORDER — ONDANSETRON HCL 4 MG PO TABS: 4.0000 mg | ORAL_TABLET | Freq: Three times a day (TID) | ORAL | Status: DC | PRN

## 2015-04-03 MED ORDER — PROCHLORPERAZINE MALEATE 10 MG PO TABS: 10.0000 mg | ORAL_TABLET | Freq: Four times a day (QID) | ORAL | Status: DC | PRN

## 2015-04-03 MED ORDER — HYDROCODONE-ACETAMINOPHEN 5-325 MG PO TABS: 2.0000 | ORAL_TABLET | ORAL | Status: DC | PRN

## 2015-04-03 MED ORDER — TEMAZEPAM 30 MG PO CAPS: 30.0000 mg | ORAL_CAPSULE | Freq: Every evening | ORAL | Status: DC | PRN

## 2015-04-03 NOTE — Progress Notes (Signed)
Hematology and Oncology Follow Up Visit  Jacob Beck [id number] 08/02/1950 65 y.o. 04/03/2015 12:07 PM   Principle Diagnosis: 65 year old gentleman with renal cell carcinoma diagnosed in April 2016. He presented with a large tumor on the left kidney measuring 15 cm and extending into the retroperitoneum causing proximal renal vein thrombosis.   Prior Therapy: Attempted surgical resection on 06/01/2014 that was unsuccessful given the size of tumor.  Current therapy: Votrient 800 mg daily started on 06/07/2014.  Interim History: Jacob Beck presents today for a follow-up visit with his wife. Since the last visit, he continues to do relatively well. He continues to exercise periodically predominantly walking without any major decline. He did notice a few days ago discoloration of his urine and stool after a long walk which have resolved at this time. He did not report any hematochezia or melena. Did not report any frank hematuria. He did not report any dysuria or frequency. His performance status and activity level remain stable.  He continues to be on Votrient and have tolerated it well. He denied any diarrhea, fatigue or decline in his energy. He does report some occasional nausea and indigestion and uses Compazine and occasionally Zofran for management. He is continued to be reasonably well have not lost any weight.   He does not report any headaches, blurry vision, double vision, syncope or seizures. He does not report any fevers, chills or sweats. He does not report any chest pain, palpitation, orthopnea, leg edema. He does not report any cough, hemoptysis, hematemesis. He does not report any early satiety or abdominal distention. He does not report any skeletal pains to his back pain shoulder pain or hip pain. He does not report any lymphadenopathy or petechiae. Remainder of review of systems unremarkable.   Medications: I have reviewed the patient's current medications.  Current Outpatient  Prescriptions  Medication Sig Dispense Refill  . HYDROcodone-acetaminophen (NORCO/VICODIN) 5-325 MG tablet Take 2 tablets by mouth every 4 (four) hours as needed for moderate pain. 90 tablet 0  . Multiple Vitamins-Minerals (MULTIVITAMIN WITH MINERALS) tablet Take 1 tablet by mouth daily.    . ondansetron (ZOFRAN) 4 MG tablet Take 1 tablet (4 mg total) by mouth every 8 (eight) hours as needed for nausea or vomiting. 20 tablet 0  . prochlorperazine (COMPAZINE) 10 MG tablet Take 1 tablet (10 mg total) by mouth every 6 (six) hours as needed for nausea or vomiting. 30 tablet 1  . temazepam (RESTORIL) 30 MG capsule Take 1 capsule (30 mg total) by mouth at bedtime as needed for sleep. 30 capsule 0  . VOTRIENT 200 MG tablet TAKE 4 TABLETS (800MG ) ORALLY ONCE DAILY ON AN EMPTY STOMACH (AT LEAST1 HOUR BEFORE OR 2 HOURS AFTER A MEAL). SWALLOW WHOLE. DO NOT CRUSH. 120 tablet 0   No current facility-administered medications for this visit.     Allergies:  Allergies  Allergen Reactions  . Oxycodone Other (See Comments)    "halucinations"    Past Medical History, Surgical history, Social history, and Family History were reviewed and updated.   Physical Exam: Blood pressure 142/76, pulse 64, temperature 97.8 F (36.6 C), temperature source Oral, resp. rate 18, height 6' (1.829 m), weight 173 lb 4.8 oz (78.608 kg), SpO2 100 %. ECOG: 1 General appearance: alert and cooperative. Appeared without distress. Head: Normocephalic, without obvious abnormality. No oral ulcers or lesions. Neck: no adenopathy no thyroid masses. Lymph nodes: Cervical, supraclavicular, and axillary nodes normal. Heart:regular rate and rhythm, S1, S2 normal, no murmur,  click, rub or gallop Lung:chest clear, no wheezing, rales, normal symmetric air entry Abdomin: soft, non-tender, without masses or organomegaly no rebound or guarding. EXT:no erythema, induration, or nodules   Lab Results: Lab Results  Component Value Date    WBC 4.9 04/03/2015   HGB 13.5 04/03/2015   HCT 41.5 04/03/2015   MCV 102.6* 04/03/2015   PLT 191 04/03/2015     Chemistry      Component Value Date/Time   NA 141 02/19/2015 0857   NA 140 10/15/2014 0932   K 4.1 02/19/2015 0857   K 3.9 10/15/2014 0932   CL 105 10/15/2014 0932   CO2 30* 02/19/2015 0857   CO2 26 10/15/2014 0932   BUN 22.2 02/19/2015 0857   BUN 19 10/15/2014 0932   CREATININE 1.1 02/19/2015 0857   CREATININE 0.72 10/15/2014 0932      Component Value Date/Time   CALCIUM 9.7 02/19/2015 0857   CALCIUM 9.0 10/15/2014 0932   ALKPHOS 95 02/19/2015 0857   ALKPHOS 79 10/15/2014 0932   AST 27 02/19/2015 0857   AST 19 10/15/2014 0932   ALT 22 02/19/2015 0857   ALT 13* 10/15/2014 0932   BILITOT 0.58 02/19/2015 0857   BILITOT 0.5 10/15/2014 0932      Impression and Plan:   65 year old gentleman with the following issues:  1.Renal cell carcinoma presented with a large mass measuring 15.0 x 14.1 x 9.9 cm left renal mass extending off the upper pole of the left kidney into the retroperitoneum. He has also evidence of lymphadenopathy and small pulmonary nodules. He presented with abdominal pain and 20 pound weight loss. This is a biopsy proven to be renal cell carcinoma with a biopsy done on 05/04/2014. Attempted surgical resection for debulking purposes was unsuccessful.  He is currently on Votrient 800 mg daily and is tolerating that dose well. CT scan on 01/07/2015 showed relatively stable disease and some  decrease in the size of his large left kidney mass.   I have instructed him to continue on the same dose and schedule and plan on repeating imaging studies in April 2017.  2. Diarrhea: Very limited without any need for antidiarrheal medication. I continue to educate him about that possibility.  3. Diffuse body pain: Very limited at this time and uses hydrocodone infrequently. I have refilled his hydrocodone today to use as needed.  4. Nausea: It has been episodic  unlikely related to Votrient. I have refilled his Compazine and Zofran with instructions how to use them.  5. Discoloration of his urine: Appears to have resolved at this time and I educated him about signs and symptoms of urinary tract infection as well as instructions to call back if he develops frank hematuria.  6. The liver function surveillance: His LFTs continue to be normal despite being on Votrient. We'll continue to monitor this with every visit.  7. Follow-up: Will be in April 2017 for repeat imaging studies.   [facility], MD 3/1/201712:07 PM

## 2015-04-03 NOTE — Telephone Encounter (Signed)
appt made and avs printed. Barium given to patient for CT to be scheduled

## 2015-04-10 ENCOUNTER — Encounter: Payer: Self-pay | Admitting: Oncology

## 2015-04-10 NOTE — Progress Notes (Signed)
faxed notes/labs 10/04/14-present

## 2015-04-26 ENCOUNTER — Encounter: Payer: Self-pay | Admitting: Oncology

## 2015-04-26 NOTE — Progress Notes (Signed)
left in [employer] forms-faxed notes/labs [phone]

## 2015-04-26 NOTE — Progress Notes (Signed)
left in [employer] forms

## 2015-05-06 ENCOUNTER — Encounter: Payer: Self-pay | Admitting: Oncology

## 2015-05-06 NOTE — Progress Notes (Signed)
[employer] form on file from 12/2014 I sent to medical records.

## 2015-05-07 ENCOUNTER — Other Ambulatory Visit: Payer: Self-pay | Admitting: *Deleted

## 2015-05-07 ENCOUNTER — Telehealth: Payer: Self-pay | Admitting: *Deleted

## 2015-05-07 ENCOUNTER — Encounter: Payer: Self-pay | Admitting: Oncology

## 2015-05-07 ENCOUNTER — Other Ambulatory Visit: Payer: Self-pay | Admitting: Oncology

## 2015-05-07 DIAGNOSIS — G8929 Other chronic pain: Secondary | ICD-10-CM

## 2015-05-07 DIAGNOSIS — C649 Malignant neoplasm of unspecified kidney, except renal pelvis: Secondary | ICD-10-CM

## 2015-05-07 MED ORDER — HYDROCODONE-ACETAMINOPHEN 5-325 MG PO TABS: 2.0000 | ORAL_TABLET | ORAL | Status: DC | PRN

## 2015-05-07 NOTE — Progress Notes (Signed)
Faxed sent to [employer] 10/10/14 I sent to medical records

## 2015-05-07 NOTE — Telephone Encounter (Signed)
Received call from [place], [place], that patient wants to have CT scans at [place]. The orders need to be faxed to [phone] so, the patient can schedule these tests. Instructed patient to call [place] so, we can cancel the lab/CT scan here at [facility]. Patient verbalized understanding.

## 2015-05-13 ENCOUNTER — Encounter: Payer: Self-pay | Admitting: *Deleted

## 2015-05-14 ENCOUNTER — Ambulatory Visit (HOSPITAL_COMMUNITY): Payer: Managed Care, Other (non HMO)

## 2015-05-14 ENCOUNTER — Other Ambulatory Visit: Payer: Managed Care, Other (non HMO)

## 2015-05-15 ENCOUNTER — Encounter: Payer: Self-pay | Admitting: Oncology

## 2015-05-15 ENCOUNTER — Encounter: Payer: Self-pay | Admitting: *Deleted

## 2015-05-15 NOTE — Progress Notes (Signed)
left votrient form for dr. Alen Blew

## 2015-05-16 ENCOUNTER — Telehealth: Payer: Self-pay | Admitting: Oncology

## 2015-05-16 ENCOUNTER — Ambulatory Visit (HOSPITAL_BASED_OUTPATIENT_CLINIC_OR_DEPARTMENT_OTHER): Payer: Managed Care, Other (non HMO) | Admitting: Oncology

## 2015-05-16 VITALS — BP 154/71 | HR 62 | Temp 97.7°F | Resp 18 | Ht 72.0 in | Wt 173.4 lb

## 2015-05-16 DIAGNOSIS — D642 Secondary sideroblastic anemia due to drugs and toxins: Secondary | ICD-10-CM

## 2015-05-16 DIAGNOSIS — R110 Nausea: Secondary | ICD-10-CM

## 2015-05-16 DIAGNOSIS — R52 Pain, unspecified: Secondary | ICD-10-CM | POA: Diagnosis not present

## 2015-05-16 DIAGNOSIS — N2889 Other specified disorders of kidney and ureter: Secondary | ICD-10-CM

## 2015-05-16 NOTE — Progress Notes (Signed)
Hematology and Oncology Follow Up Visit  Jacob Beck [id number] 09-12-1950 64 y.o. 05/16/2015 12:15 PM   Principle Diagnosis: 65 year old gentleman with renal cell carcinoma diagnosed in April 2016. He presented with a large tumor on the left kidney measuring 15 cm and extending into the retroperitoneum causing proximal renal vein thrombosis.   Prior Therapy: Attempted surgical resection on 06/01/2014 that was unsuccessful given the size of tumor.  Current therapy: Votrient 800 mg daily started on 06/07/2014.  Interim History: Jacob Beck presents today for a follow-up visit with his wife. Since the last visit, he reports no major changes in his health. He continues to exercise periodically predominantly walking without any major decline. He did not report any hematochezia or melena. Did not report any frank hematuria. He did not report any dysuria or frequency. His performance status and activity level remain stable. He continues to be on Votrient and have tolerated it well. He denied any diarrhea, fatigue or decline in his energy. His blood pressure have been within normal range when checked outside of the doctor's office. Does not report any new pain issues.   He does not report any headaches, blurry vision, double vision, syncope or seizures. He does not report any fevers, chills or sweats. He does not report any chest pain, palpitation, orthopnea, leg edema. He does not report any cough, hemoptysis, hematemesis. He does not report any early satiety or abdominal distention. He does not report any skeletal pains to his back pain shoulder pain or hip pain. He does not report any lymphadenopathy or petechiae. Remainder of review of systems unremarkable.   Medications: I have reviewed the patient's current medications.  Current Outpatient Prescriptions  Medication Sig Dispense Refill  . HYDROcodone-acetaminophen (NORCO/VICODIN) 5-325 MG tablet Take 2 tablets by mouth every 4 (four) hours as  needed for moderate pain. 90 tablet 0  . Multiple Vitamins-Minerals (MULTIVITAMIN WITH MINERALS) tablet Take 1 tablet by mouth daily.    . ondansetron (ZOFRAN) 4 MG tablet Take 1 tablet (4 mg total) by mouth every 8 (eight) hours as needed for nausea or vomiting. 20 tablet 0  . prochlorperazine (COMPAZINE) 10 MG tablet Take 1 tablet (10 mg total) by mouth every 6 (six) hours as needed for nausea or vomiting. 30 tablet 1  . temazepam (RESTORIL) 30 MG capsule Take 1 capsule (30 mg total) by mouth at bedtime as needed for sleep. 30 capsule 0  . VOTRIENT 200 MG tablet TAKE 4 TABLETS (800MG ) ORALLY ONCE DAILY ON AN EMPTY STOMACH (AT LEAST1 HOUR BEFORE OR 2 HOURS AFTER A MEAL). SWALLOW WHOLE. DO NOT CRUSH. 120 tablet 0   No current facility-administered medications for this visit.     Allergies:  Allergies  Allergen Reactions  . Oxycodone Other (See Comments)    "halucinations"    Past Medical History, Surgical history, Social history, and Family History were reviewed and updated.   Physical Exam: Blood pressure 154/71, pulse 62, temperature 97.7 F (36.5 C), temperature source Oral, resp. rate 18, height 6' (1.829 m), weight 173 lb 6.4 oz (78.654 kg), SpO2 100 %. ECOG: 1 General appearance: Alert, awake gentleman without distress. Head: Normocephalic, without obvious abnormality. No oral thrush noted. Neck: no adenopathy no thyroid masses. Lymph nodes: Cervical, supraclavicular, and axillary nodes normal. Heart:regular rate and rhythm, S1, S2 normal, no murmur, click, rub or gallop Lung:chest clear, no wheezing, rales, normal symmetric air entry Abdomin: soft, non-tender, without masses or organomegaly no shifting dullness or ascites. EXT:no erythema, induration, or nodules  Lab Results: Lab Results  Component Value Date   WBC 4.9 04/03/2015   HGB 13.5 04/03/2015   HCT 41.5 04/03/2015   MCV 102.6* 04/03/2015   PLT 191 04/03/2015     Chemistry      Component Value Date/Time    NA 141 04/03/2015 1135   NA 140 10/15/2014 0932   K 4.8 04/03/2015 1135   K 3.9 10/15/2014 0932   CL 105 10/15/2014 0932   CO2 30* 04/03/2015 1135   CO2 26 10/15/2014 0932   BUN 31.2* 04/03/2015 1135   BUN 19 10/15/2014 0932   CREATININE 1.0 04/03/2015 1135   CREATININE 0.72 10/15/2014 0932      Component Value Date/Time   CALCIUM 9.9 04/03/2015 1135   CALCIUM 9.0 10/15/2014 0932   ALKPHOS 79 04/03/2015 1135   ALKPHOS 79 10/15/2014 0932   AST 23 04/03/2015 1135   AST 19 10/15/2014 0932   ALT 20 04/03/2015 1135   ALT 13* 10/15/2014 0932   BILITOT 0.49 04/03/2015 1135   BILITOT 0.5 10/15/2014 0932      CT scan obtained in the imaging center at [facility] [phone]) on [id number] was reviewed today. And showed no major changes compared to CT scan in December 2016. He continues to have a large renal mass which significantly have not changed. Multiple pulmonary nodules again noted and also unchanged.   Impression and Plan:   65 year old gentleman with the following issues:  1.Renal cell carcinoma presented with a large mass measuring 15.0 x 14.1 x 9.9 cm left renal mass extending off the upper pole of the left kidney into the retroperitoneum. He has also evidence of lymphadenopathy and small pulmonary nodules. He presented with abdominal pain and 20 pound weight loss. This is a biopsy proven to be renal cell carcinoma with a biopsy done on 05/04/2014. Attempted surgical resection for debulking purposes was unsuccessful.  He is currently on Votrient 800 mg daily and is tolerating that dose well. CT scan on 05/14/2015 was reviewed today and discussed with the patient. This scan showed relatively stable disease. The plan is to continue the same dose and schedule and repeat imaging studies in August 2017.    2. Diarrhea: Very limited without any need for antidiarrheal medication. I continue to educate him about that possibility.  3. Diffuse body pain: Very  limited at this time and uses hydrocodone infrequently. His disease have decreased as of late.  4. Nausea: It has been episodic unlikely related to Votrient. I have refilled his Compazine and Zofran with instructions how to use them.  5. Discoloration of his urine: Resolved at this time.  6. The liver function surveillance: His LFTs continue to be normal despite being on Votrient. We'll continue to monitor this with every visit.  7. Follow-up: Will be in 4-5 weeks for clinical visit.   Zola Button, MD 4/13/201712:15 PM

## 2015-05-16 NOTE — Telephone Encounter (Signed)
per pof to sch pt appt-gave pt copy of avs °

## 2015-05-17 ENCOUNTER — Other Ambulatory Visit: Payer: Self-pay | Admitting: Oncology

## 2015-05-20 ENCOUNTER — Encounter: Payer: Self-pay | Admitting: Oncology

## 2015-05-20 NOTE — Progress Notes (Signed)
Resent req for votrient. Per [place] it is unresectable

## 2015-05-27 ENCOUNTER — Encounter: Payer: Self-pay | Admitting: Oncology

## 2015-05-27 NOTE — Progress Notes (Signed)
left votrient form for dr. Alen Blew- votrient approved 06/15/15-06/14/16

## 2015-05-27 NOTE — Progress Notes (Signed)
[id number]

## 2015-05-31 ENCOUNTER — Other Ambulatory Visit: Payer: Self-pay | Admitting: *Deleted

## 2015-05-31 ENCOUNTER — Other Ambulatory Visit: Payer: Self-pay | Admitting: Oncology

## 2015-05-31 DIAGNOSIS — G8929 Other chronic pain: Secondary | ICD-10-CM

## 2015-05-31 DIAGNOSIS — C649 Malignant neoplasm of unspecified kidney, except renal pelvis: Secondary | ICD-10-CM

## 2015-05-31 MED ORDER — HYDROCODONE-ACETAMINOPHEN 5-325 MG PO TABS: 2.0000 | ORAL_TABLET | ORAL | Status: DC | PRN

## 2015-06-20 ENCOUNTER — Telehealth: Payer: Self-pay | Admitting: Oncology

## 2015-06-20 ENCOUNTER — Ambulatory Visit (HOSPITAL_BASED_OUTPATIENT_CLINIC_OR_DEPARTMENT_OTHER): Payer: Managed Care, Other (non HMO) | Admitting: Oncology

## 2015-06-20 ENCOUNTER — Other Ambulatory Visit (HOSPITAL_BASED_OUTPATIENT_CLINIC_OR_DEPARTMENT_OTHER): Payer: Managed Care, Other (non HMO)

## 2015-06-20 VITALS — BP 141/68 | HR 65 | Temp 98.1°F | Resp 20 | Ht 72.0 in | Wt 173.0 lb

## 2015-06-20 DIAGNOSIS — G8929 Other chronic pain: Secondary | ICD-10-CM

## 2015-06-20 DIAGNOSIS — C642 Malignant neoplasm of left kidney, except renal pelvis: Secondary | ICD-10-CM | POA: Diagnosis not present

## 2015-06-20 DIAGNOSIS — N2889 Other specified disorders of kidney and ureter: Secondary | ICD-10-CM

## 2015-06-20 DIAGNOSIS — C649 Malignant neoplasm of unspecified kidney, except renal pelvis: Secondary | ICD-10-CM | POA: Diagnosis not present

## 2015-06-20 DIAGNOSIS — R918 Other nonspecific abnormal finding of lung field: Secondary | ICD-10-CM

## 2015-06-20 DIAGNOSIS — R197 Diarrhea, unspecified: Secondary | ICD-10-CM

## 2015-06-20 DIAGNOSIS — M791 Myalgia: Secondary | ICD-10-CM | POA: Diagnosis not present

## 2015-06-20 DIAGNOSIS — R110 Nausea: Secondary | ICD-10-CM

## 2015-06-20 LAB — CBC WITH DIFFERENTIAL/PLATELET
BASO%: 0.7 % (ref 0.0–2.0)
BASOS ABS: 0 10*3/uL (ref 0.0–0.1)
EOS ABS: 0.1 10*3/uL (ref 0.0–0.5)
EOS%: 2.2 % (ref 0.0–7.0)
HEMATOCRIT: 36.5 % — AB (ref 38.4–49.9)
HGB: 11.8 g/dL — ABNORMAL LOW (ref 13.0–17.1)
LYMPH#: 1.1 10*3/uL (ref 0.9–3.3)
LYMPH%: 29.6 % (ref 14.0–49.0)
MCH: 31.2 pg (ref 27.2–33.4)
MCHC: 32.2 g/dL (ref 32.0–36.0)
MCV: 96.7 fL (ref 79.3–98.0)
MONO#: 0.3 10*3/uL (ref 0.1–0.9)
MONO%: 7.6 % (ref 0.0–14.0)
NEUT#: 2.2 10*3/uL (ref 1.5–6.5)
NEUT%: 59.9 % (ref 39.0–75.0)
PLATELETS: 207 10*3/uL (ref 140–400)
RBC: 3.78 10*6/uL — ABNORMAL LOW (ref 4.20–5.82)
RDW: 17.5 % — ABNORMAL HIGH (ref 11.0–14.6)
WBC: 3.7 10*3/uL — ABNORMAL LOW (ref 4.0–10.3)

## 2015-06-20 LAB — COMPREHENSIVE METABOLIC PANEL
ALT: 13 U/L (ref 0–55)
ANION GAP: 8 meq/L (ref 3–11)
AST: 19 U/L (ref 5–34)
Albumin: 3.1 g/dL — ABNORMAL LOW (ref 3.5–5.0)
Alkaline Phosphatase: 92 U/L (ref 40–150)
BILIRUBIN TOTAL: 0.38 mg/dL (ref 0.20–1.20)
BUN: 23.2 mg/dL (ref 7.0–26.0)
CALCIUM: 9.8 mg/dL (ref 8.4–10.4)
CHLORIDE: 103 meq/L (ref 98–109)
CO2: 30 mEq/L — ABNORMAL HIGH (ref 22–29)
CREATININE: 0.9 mg/dL (ref 0.7–1.3)
Glucose: 114 mg/dl (ref 70–140)
Potassium: 4.1 mEq/L (ref 3.5–5.1)
Sodium: 141 mEq/L (ref 136–145)
Total Protein: 7.7 g/dL (ref 6.4–8.3)

## 2015-06-20 MED ORDER — HYDROCODONE-ACETAMINOPHEN 5-325 MG PO TABS: 2.0000 | ORAL_TABLET | ORAL | Status: DC | PRN

## 2015-06-20 NOTE — Telephone Encounter (Signed)
Gave and printed appt sched and avs for pt for July  °

## 2015-06-20 NOTE — Progress Notes (Signed)
Hematology and Oncology Follow Up Visit  Jacob Beck [id number] 03/14/1950 65 y.o. 06/20/2015 9:51 AM   Principle Diagnosis: 65 year old gentleman with renal cell carcinoma diagnosed in April 2016. He presented with a large tumor on the left kidney measuring 15 cm and extending into the retroperitoneum causing proximal renal vein thrombosis.   Prior Therapy: Attempted surgical resection on 06/01/2014 that was unsuccessful given the size of tumor.  Current therapy: Votrient 800 mg daily started on 06/07/2014.  Interim History: Mr. Kosak presents today for a follow-up visit with his wife. Since the last visit, he continues to do well and tolerate Votrient without any new complications. He continues to exercise periodically predominantly walking without any major decline. He did not report any hematochezia or melena. Did not report any frank hematuria. He did not report any dysuria or frequency. His performance status and activity level remain stable. He denied any diarrhea and does report mild fatigue.   He does not report any headaches, blurry vision, double vision, syncope or seizures. He does not report any fevers, chills or sweats. He does not report any chest pain, palpitation, orthopnea, leg edema. He does not report any cough, hemoptysis, hematemesis. He does not report any early satiety or abdominal distention. He does not report any skeletal pains to his back pain shoulder pain or hip pain. He does not report any lymphadenopathy or petechiae. Remainder of review of systems unremarkable.   Medications: I have reviewed the patient's current medications.  Current Outpatient Prescriptions  Medication Sig Dispense Refill  . HYDROcodone-acetaminophen (NORCO/VICODIN) 5-325 MG tablet Take 2 tablets by mouth every 4 (four) hours as needed for moderate pain. 90 tablet 0  . Multiple Vitamins-Minerals (MULTIVITAMIN WITH MINERALS) tablet Take 1 tablet by mouth daily.    . ondansetron (ZOFRAN) 4  MG tablet Take 1 tablet (4 mg total) by mouth every 8 (eight) hours as needed for nausea or vomiting. 20 tablet 0  . prochlorperazine (COMPAZINE) 10 MG tablet Take 1 tablet (10 mg total) by mouth every 6 (six) hours as needed for nausea or vomiting. 30 tablet 1  . temazepam (RESTORIL) 30 MG capsule Take 1 capsule (30 mg total) by mouth at bedtime as needed for sleep. 30 capsule 0  . VOTRIENT 200 MG tablet TAKE 4 TABLETS (800MG ) ORALLY ONCE DAILY ON AN EMPTY STOMACH (AT LEAST1 HOUR BEFORE OR 2 HOURS AFTER A MEAL). SWALLOW WHOLE. DO NOT CRUSH. 120 tablet 0   No current facility-administered medications for this visit.     Allergies:  Allergies  Allergen Reactions  . Oxycodone Other (See Comments)    "halucinations"    Past Medical History, Surgical history, Social history, and Family History were reviewed and updated.   Physical Exam: Blood pressure 141/68, pulse 65, temperature 98.1 F (36.7 C), temperature source Oral, resp. rate 20, height 6' (1.829 m), weight 173 lb (78.472 kg), SpO2 99 %. ECOG: 1 General appearance: Pleasant-appearing gentleman without distress. Head: Normocephalic, without obvious abnormality. No oral ulcers or lesions. Neck: no adenopathy no thyroid masses. Lymph nodes: Cervical, supraclavicular, and axillary nodes normal. Heart:regular rate and rhythm, S1, S2 normal, no murmur, click, rub or gallop Lung:chest clear, no wheezing, rales, normal symmetric air entry Abdomin: soft, non-tender, without masses or organomegaly no rebound or guarding. EXT:no erythema, induration, or nodules   Lab Results: Lab Results  Component Value Date   WBC 3.7* 06/20/2015   HGB 11.8* 06/20/2015   HCT 36.5* 06/20/2015   MCV 96.7 06/20/2015   PLT  207 06/20/2015     Chemistry      Component Value Date/Time   NA 141 04/03/2015 1135   NA 140 10/15/2014 0932   K 4.8 04/03/2015 1135   K 3.9 10/15/2014 0932   CL 105 10/15/2014 0932   CO2 30* 04/03/2015 1135   CO2 26  10/15/2014 0932   BUN 31.2* 04/03/2015 1135   BUN 19 10/15/2014 0932   CREATININE 1.0 04/03/2015 1135   CREATININE 0.72 10/15/2014 0932      Component Value Date/Time   CALCIUM 9.9 04/03/2015 1135   CALCIUM 9.0 10/15/2014 0932   ALKPHOS 79 04/03/2015 1135   ALKPHOS 79 10/15/2014 0932   AST 23 04/03/2015 1135   AST 19 10/15/2014 0932   ALT 20 04/03/2015 1135   ALT 13* 10/15/2014 0932   BILITOT 0.49 04/03/2015 1135   BILITOT 0.5 10/15/2014 0932      CT scan obtained in the imaging center at [facility] [phone]) on [id number] showed no major changes compared to CT scan in December 2016. He continues to have a large renal mass which significantly have not changed. Multiple pulmonary nodules again noted and also unchanged.   Impression and Plan:   65 year old gentleman with the following issues:  1.Renal cell carcinoma presented with a large mass measuring 15.0 x 14.1 x 9.9 cm left renal mass extending off the upper pole of the left kidney into the retroperitoneum. He has also evidence of lymphadenopathy and small pulmonary nodules. He presented with abdominal pain and 20 pound weight loss. This is a biopsy proven to be renal cell carcinoma with a biopsy done on 05/04/2014. Attempted surgical resection for debulking purposes was unsuccessful.  He is currently on Votrient 800 mg daily and is tolerating that dose well. CT scan on 05/14/2015 showed relatively stable disease. The plan is to continue the same dose and schedule and repeat imaging studies in August or September 2017.    2. Diarrhea: Very limited without any need for antidiarrheal medication. He is well informed and educated about the possibility and will use antidiarrhea medication per instructions.  3. Diffuse body pain: Very limited at this time and uses hydrocodone infrequently.   4. Nausea: It has been episodic unlikely related to Votrient. I have refilled his Compazine and Zofran with  instructions how to use them.  5. Discoloration of his urine: Resolved at this time.  6. The liver function surveillance: His LFTs continue to be normal despite being on Votrient. We'll continue to monitor this with every visit.  7. Follow-up: Will be in 7 weeks.   [facility], MD 5/18/20179:51 AM

## 2015-06-21 ENCOUNTER — Encounter: Payer: Self-pay | Admitting: Pharmacist

## 2015-06-21 NOTE — Progress Notes (Signed)
06/21/15: patient approved as of 5/4 for free Votrient through [employer] Patient assistance program. Votrient has been mailed to patient's home.

## 2015-07-22 ENCOUNTER — Other Ambulatory Visit: Payer: Self-pay | Admitting: Oncology

## 2015-07-22 ENCOUNTER — Other Ambulatory Visit: Payer: Self-pay | Admitting: *Deleted

## 2015-07-22 ENCOUNTER — Encounter: Payer: Self-pay | Admitting: *Deleted

## 2015-07-22 DIAGNOSIS — G8929 Other chronic pain: Secondary | ICD-10-CM

## 2015-07-22 DIAGNOSIS — C649 Malignant neoplasm of unspecified kidney, except renal pelvis: Secondary | ICD-10-CM

## 2015-07-22 MED ORDER — HYDROCODONE-ACETAMINOPHEN 5-325 MG PO TABS: 2.0000 | ORAL_TABLET | ORAL | Status: DC | PRN

## 2015-08-05 ENCOUNTER — Telehealth: Payer: Self-pay | Admitting: Pharmacist

## 2015-08-05 NOTE — Telephone Encounter (Signed)
Oral Chemotherapy Follow-Up Form  Original Start date of oral chemotherapy: 06/2014  Called patient today to follow up regarding patient's oral chemotherapy medication: Votrient  I received call from Lelan Pons, rep w/ [employer] (ph# [phone]) stating pt has new insurance w/ [employer] effective 08/03/15 and there is no Prior Auth on file at [employer] for [employer]. I contacted [employer] (ph# [phone]) and was able to receive approval for Votrient (coverage effective 02/01/15 through 02/02/16).  I faxed the PA to [employer] (fax# [phone]) and received fax confirmation back.  Pt reports the following side effects:  Decreased appetite - not new Diarrhea - not new- comes & goes.  Used Imodium last week; 1st dose - 2 caps then took 1 cap after that.  Pt just got a new supply of Votrient (120 tabs) and will begin using that today.  Thank you, Kennith Center, Pharm.D., CPP 08/05/2015@3 :26 PM Oral Chemotherapy [facility].

## 2015-08-13 ENCOUNTER — Other Ambulatory Visit (HOSPITAL_BASED_OUTPATIENT_CLINIC_OR_DEPARTMENT_OTHER): Payer: Medicare HMO

## 2015-08-13 ENCOUNTER — Ambulatory Visit (HOSPITAL_BASED_OUTPATIENT_CLINIC_OR_DEPARTMENT_OTHER): Payer: Medicare HMO | Admitting: Oncology

## 2015-08-13 ENCOUNTER — Telehealth: Payer: Self-pay | Admitting: Oncology

## 2015-08-13 VITALS — BP 149/73 | HR 80 | Temp 98.5°F | Resp 18 | Ht 72.0 in | Wt 169.7 lb

## 2015-08-13 DIAGNOSIS — R197 Diarrhea, unspecified: Secondary | ICD-10-CM | POA: Diagnosis not present

## 2015-08-13 DIAGNOSIS — N2889 Other specified disorders of kidney and ureter: Secondary | ICD-10-CM

## 2015-08-13 DIAGNOSIS — R110 Nausea: Secondary | ICD-10-CM

## 2015-08-13 DIAGNOSIS — C642 Malignant neoplasm of left kidney, except renal pelvis: Secondary | ICD-10-CM

## 2015-08-13 DIAGNOSIS — M791 Myalgia: Secondary | ICD-10-CM | POA: Diagnosis not present

## 2015-08-13 DIAGNOSIS — C786 Secondary malignant neoplasm of retroperitoneum and peritoneum: Secondary | ICD-10-CM | POA: Diagnosis not present

## 2015-08-13 DIAGNOSIS — C649 Malignant neoplasm of unspecified kidney, except renal pelvis: Secondary | ICD-10-CM

## 2015-08-13 DIAGNOSIS — G8929 Other chronic pain: Secondary | ICD-10-CM

## 2015-08-13 LAB — CBC WITH DIFFERENTIAL/PLATELET
BASO%: 0.2 % (ref 0.0–2.0)
BASOS ABS: 0 10*3/uL (ref 0.0–0.1)
EOS ABS: 0.1 10*3/uL (ref 0.0–0.5)
EOS%: 2.4 % (ref 0.0–7.0)
HCT: 38.1 % — ABNORMAL LOW (ref 38.4–49.9)
HGB: 11.9 g/dL — ABNORMAL LOW (ref 13.0–17.1)
LYMPH%: 28.8 % (ref 14.0–49.0)
MCH: 30.2 pg (ref 27.2–33.4)
MCHC: 31.2 g/dL — AB (ref 32.0–36.0)
MCV: 96.7 fL (ref 79.3–98.0)
MONO#: 0.4 10*3/uL (ref 0.1–0.9)
MONO%: 8.8 % (ref 0.0–14.0)
NEUT%: 59.8 % (ref 39.0–75.0)
NEUTROS ABS: 2.7 10*3/uL (ref 1.5–6.5)
Platelets: 249 10*3/uL (ref 140–400)
RBC: 3.94 10*6/uL — AB (ref 4.20–5.82)
RDW: 18 % — ABNORMAL HIGH (ref 11.0–14.6)
WBC: 4.5 10*3/uL (ref 4.0–10.3)
lymph#: 1.3 10*3/uL (ref 0.9–3.3)

## 2015-08-13 LAB — COMPREHENSIVE METABOLIC PANEL
ALT: 18 U/L (ref 0–55)
AST: 20 U/L (ref 5–34)
Albumin: 3.1 g/dL — ABNORMAL LOW (ref 3.5–5.0)
Alkaline Phosphatase: 131 U/L (ref 40–150)
Anion Gap: 12 mEq/L — ABNORMAL HIGH (ref 3–11)
BILIRUBIN TOTAL: 0.38 mg/dL (ref 0.20–1.20)
BUN: 25.7 mg/dL (ref 7.0–26.0)
CHLORIDE: 99 meq/L (ref 98–109)
CO2: 29 meq/L (ref 22–29)
CREATININE: 0.9 mg/dL (ref 0.7–1.3)
Calcium: 9.9 mg/dL (ref 8.4–10.4)
EGFR: >90 mL/min/{1.73_m2} (ref >90)
GLUCOSE: 104 mg/dL (ref 70–140)
Potassium: 4.9 mEq/L (ref 3.5–5.1)
SODIUM: 140 meq/L (ref 136–145)
TOTAL PROTEIN: 8.3 g/dL (ref 6.4–8.3)

## 2015-08-13 MED ORDER — HYDROCODONE-ACETAMINOPHEN 5-325 MG PO TABS: 2.0000 | ORAL_TABLET | ORAL | Status: DC | PRN

## 2015-08-13 NOTE — Addendum Note (Signed)
Addended by: Wyatt Portela on: 08/13/2015 10:02 AM   Modules accepted: Orders

## 2015-08-13 NOTE — Telephone Encounter (Signed)
per pof to sch pt appt--sent Dr Alen Blew email to adv patient wanted CT done @ [place] and to changee order so it can be cheduled-gave avs

## 2015-08-13 NOTE — Progress Notes (Signed)
Hematology and Oncology Follow Up Visit  Jacob Beck [id number] Jun 19, 1950 65 y.o. 08/13/2015 9:05 AM   Principle Diagnosis: 65 year old gentleman with renal cell carcinoma diagnosed in April 2016. He presented with a large tumor on the left kidney measuring 15 cm and extending into the retroperitoneum causing proximal renal vein thrombosis.   Prior Therapy: Attempted surgical resection on 06/01/2014 that was unsuccessful given the size of tumor.  Current therapy: Votrient 800 mg daily started on 06/07/2014.  Interim History: Jacob Beck presents today for a follow-up visit with his wife. Since the last visit, he reports no major changes in his health. He continues to do well and tolerate Votrient without any new complications. He continues to exercise periodically predominantly walking without any major decline. He did not report any hematochezia or melena. Did not report any frank hematuria. He did not report any dysuria or frequency.   He does report a periodic flank pain and pelvic pain which have increased slightly in the last few weeks. He was also experiencing flulike symptoms and your pain and was prescribed antibiotics and these symptoms have resolved. He does report using hydrocodone and at times is less effective. He is using it once a day and not every day.   He does not report any headaches, blurry vision, double vision, syncope or seizures. He does not report any fevers, chills or sweats. He does not report any chest pain, palpitation, orthopnea, leg edema. He does not report any cough, hemoptysis, hematemesis. He does not report any early satiety or abdominal distention. He does not report any skeletal pains to his back pain shoulder pain or hip pain. He does not report any lymphadenopathy or petechiae. Remainder of review of systems unremarkable.   Medications: I have reviewed the patient's current medications.  Current Outpatient Prescriptions  Medication Sig Dispense Refill   . HYDROcodone-acetaminophen (NORCO/VICODIN) 5-325 MG tablet Take 2 tablets by mouth every 4 (four) hours as needed for moderate pain. 90 tablet 0  . Multiple Vitamins-Minerals (MULTIVITAMIN WITH MINERALS) tablet Take 1 tablet by mouth daily.    . ondansetron (ZOFRAN) 4 MG tablet Take 1 tablet (4 mg total) by mouth every 8 (eight) hours as needed for nausea or vomiting. 20 tablet 0  . prochlorperazine (COMPAZINE) 10 MG tablet Take 1 tablet (10 mg total) by mouth every 6 (six) hours as needed for nausea or vomiting. 30 tablet 1  . VOTRIENT 200 MG tablet TAKE 4 TABLETS (800MG ) ORALLY ONCE DAILY ON AN EMPTY STOMACH (AT LEAST1 HOUR BEFORE OR 2 HOURS AFTER A MEAL). SWALLOW WHOLE. DO NOT CRUSH. 120 tablet 0  . temazepam (RESTORIL) 30 MG capsule Take 1 capsule (30 mg total) by mouth at bedtime as needed for sleep. (Patient not taking: Reported on 08/13/2015) 30 capsule 0   No current facility-administered medications for this visit.     Allergies:  Allergies  Allergen Reactions  . Oxycodone Other (See Comments)    "halucinations"    Past Medical History, Surgical history, Social history, and Family History were reviewed and updated.   Physical Exam: Blood pressure 149/73, pulse 80, temperature 98.5 F (36.9 C), temperature source Oral, resp. rate 18, height 6' (1.829 m), weight 169 lb 11.2 oz (76.975 kg), SpO2 99 %. ECOG: 1 General appearance: Alert, awake gentleman without distress. Head: Normocephalic, without obvious abnormality. No oral thrush noted. Neck: no adenopathy no thyroid masses. Lymph nodes: Cervical, supraclavicular, and axillary nodes normal. Heart:regular rate and rhythm, S1, S2 normal, no murmur, click, rub  or gallop Lung:chest clear, no wheezing, rales, normal symmetric air entry Abdomin: soft, non-tender, without masses or organomegaly no shifting dullness or ascites. EXT:no erythema, induration, or nodules   Lab Results: Lab Results  Component Value Date   WBC 4.5  08/13/2015   HGB 11.9* 08/13/2015   HCT 38.1* 08/13/2015   MCV 96.7 08/13/2015   PLT 249 08/13/2015     Chemistry      Component Value Date/Time   NA 141 06/20/2015 0917   NA 140 10/15/2014 0932   K 4.1 06/20/2015 0917   K 3.9 10/15/2014 0932   CL 105 10/15/2014 0932   CO2 30* 06/20/2015 0917   CO2 26 10/15/2014 0932   BUN 23.2 06/20/2015 0917   BUN 19 10/15/2014 0932   CREATININE 0.9 06/20/2015 0917   CREATININE 0.72 10/15/2014 0932      Component Value Date/Time   CALCIUM 9.8 06/20/2015 0917   CALCIUM 9.0 10/15/2014 0932   ALKPHOS 92 06/20/2015 0917   ALKPHOS 79 10/15/2014 0932   AST 19 06/20/2015 0917   AST 19 10/15/2014 0932   ALT 13 06/20/2015 0917   ALT 13* 10/15/2014 0932   BILITOT 0.38 06/20/2015 0917   BILITOT 0.5 10/15/2014 0932      CT scan obtained in the imaging center at [facility] [phone]) on [id number] showed no major changes compared to CT scan in December 2016. He continues to have a large renal mass which significantly have not changed. Multiple pulmonary nodules again noted and also unchanged.   Impression and Plan:   65 year old gentleman with the following issues:  1.Renal cell carcinoma presented with a large mass measuring 15.0 x 14.1 x 9.9 cm left renal mass extending off the upper pole of the left kidney into the retroperitoneum. He has also evidence of lymphadenopathy and small pulmonary nodules. He presented with abdominal pain and 20 pound weight loss. This is a biopsy proven to be renal cell carcinoma with a biopsy done on 05/04/2014. Attempted surgical resection for debulking purposes was unsuccessful.  He is currently on Votrient 800 mg daily and is tolerating that dose well. CT scan on 05/14/2015 showed relatively stable disease. I have recommended continuing the same dose and schedule and repeat imaging studies in September 2017. Different salvage therapy will be utilized upon symptomatic progression.    2.  Diarrhea: Very limited without any need for antidiarrheal medication. He is well informed and educated about the possibility and will use antidiarrhea medication per instructions.  3. Diffuse body pain: Very limited at this time and uses hydrocodone infrequently. I encouraged him to use hydrocodone more than once a day if needed to and a switch to oxycodone could be possible if this strategy is ineffective.  4. Nausea: It has been episodic unlikely related to Votrient. I have refilled his Compazine and Zofran with instructions how to use them.  5. The liver function surveillance: His LFTs continue to be normal despite being on Votrient. We'll continue to monitor this with every visit.  6. Follow-up: Will be in 8 weeks after a CT scan.   [facility], MD 7/11/20179:05 AM

## 2015-08-14 ENCOUNTER — Encounter: Payer: Self-pay | Admitting: Pharmacist

## 2015-08-14 ENCOUNTER — Other Ambulatory Visit: Payer: Self-pay | Admitting: *Deleted

## 2015-08-14 DIAGNOSIS — N2889 Other specified disorders of kidney and ureter: Secondary | ICD-10-CM

## 2015-08-14 MED ORDER — PAZOPANIB HCL 200 MG PO TABS: ORAL_TABLET | ORAL | Status: DC

## 2015-08-14 NOTE — Progress Notes (Signed)
Notified by Amelia Jo, RN that pt needed refill of Votrient soon. I contacted [employer] Pt Assistance program & was told pt no longer receives assistance. Pts insur changed (as noted 08/05/15).  He will get Rx filled w/ [facility]. I faxed Rx refill to [facility] (fax# [phone]; ph# [phone])  Kennith Center, Pharm.D., CPP 08/14/2015@4 :[facility]

## 2015-08-21 ENCOUNTER — Encounter: Payer: Self-pay | Admitting: Oncology

## 2015-08-21 ENCOUNTER — Other Ambulatory Visit: Payer: Self-pay | Admitting: Family Medicine

## 2015-08-21 ENCOUNTER — Other Ambulatory Visit: Payer: Self-pay | Admitting: Oncology

## 2015-08-21 DIAGNOSIS — G4489 Other headache syndrome: Secondary | ICD-10-CM

## 2015-08-21 DIAGNOSIS — R51 Headache: Principal | ICD-10-CM

## 2015-08-21 DIAGNOSIS — R519 Headache, unspecified: Secondary | ICD-10-CM

## 2015-08-23 ENCOUNTER — Other Ambulatory Visit: Payer: Self-pay | Admitting: Oncology

## 2015-08-23 DIAGNOSIS — G4489 Other headache syndrome: Secondary | ICD-10-CM

## 2015-08-24 ENCOUNTER — Encounter: Payer: Self-pay | Admitting: Oncology

## 2015-08-26 ENCOUNTER — Other Ambulatory Visit: Payer: Self-pay | Admitting: *Deleted

## 2015-08-26 ENCOUNTER — Encounter: Payer: Self-pay | Admitting: *Deleted

## 2015-08-26 MED ORDER — LORAZEPAM 1 MG PO TABS: ORAL_TABLET | ORAL | 0 refills | Status: DC

## 2015-08-26 NOTE — Telephone Encounter (Signed)
Patient calling to say mri has not been scheduled, this RN notified central scheduling and they are currently working on this. Patient also requested a sedative prior to MRI. Per Dr Alen Blew, signed script for ativan left at front for patient p/u. Patient to take 2 tablet on arrival to radiology, prior to MRI. Patient verbalized understanding.

## 2015-08-26 NOTE — Progress Notes (Signed)
Central scheduling to call patient re: MRI appt.. Per dr Alen Blew script for ativan left at front for patient to take on arrival to radiology, prior to MRI

## 2015-08-27 ENCOUNTER — Other Ambulatory Visit: Payer: Self-pay | Admitting: Family Medicine

## 2015-08-27 DIAGNOSIS — G4489 Other headache syndrome: Secondary | ICD-10-CM

## 2015-08-28 ENCOUNTER — Encounter: Payer: Self-pay | Admitting: Oncology

## 2015-08-28 NOTE — Progress Notes (Signed)
[employer] forms left in box. I left for dr. Alen Blew to sign-faxed [phone].sent to medical recrds and mailed copy to patient

## 2015-08-28 NOTE — Progress Notes (Signed)
[employer] forms left in box. I left for dr. Alen Blew to sign

## 2015-08-30 ENCOUNTER — Encounter: Payer: Self-pay | Admitting: *Deleted

## 2015-08-30 ENCOUNTER — Other Ambulatory Visit: Payer: Self-pay | Admitting: *Deleted

## 2015-08-30 DIAGNOSIS — N2889 Other specified disorders of kidney and ureter: Secondary | ICD-10-CM

## 2015-08-30 NOTE — Progress Notes (Unsigned)
rec'd call from ingrid at dr white's office. They had scheduled an MRI at [facility] imaging, not knowing that dr Alen Blew had scheduled one here at [facility]. there fore, they were unable to get it authorized because ours was authorized. But, Central scheduling could not see dr Hazeline Junker order. So ingrid cancelled dr white's order and I re ordered mri of brain w wo contrast at [facility]. This may need to be pre-authorized. Will send note to dr Alen Blew as an Juluis Rainier and to [place] for assistance

## 2015-08-31 ENCOUNTER — Encounter: Payer: Self-pay | Admitting: Oncology

## 2015-09-02 ENCOUNTER — Encounter: Payer: Self-pay | Admitting: Oncology

## 2015-09-02 ENCOUNTER — Encounter: Payer: Self-pay | Admitting: *Deleted

## 2015-09-03 ENCOUNTER — Ambulatory Visit (HOSPITAL_COMMUNITY): Payer: Medicare HMO

## 2015-09-03 ENCOUNTER — Other Ambulatory Visit: Payer: Self-pay | Admitting: *Deleted

## 2015-09-03 ENCOUNTER — Encounter: Payer: Self-pay | Admitting: *Deleted

## 2015-09-03 DIAGNOSIS — N2889 Other specified disorders of kidney and ureter: Secondary | ICD-10-CM

## 2015-09-03 MED ORDER — PAZOPANIB HCL 200 MG PO TABS: ORAL_TABLET | ORAL | 0 refills | Status: DC

## 2015-09-05 ENCOUNTER — Encounter: Payer: Self-pay | Admitting: *Deleted

## 2015-09-05 ENCOUNTER — Other Ambulatory Visit: Payer: Self-pay | Admitting: Oncology

## 2015-09-05 ENCOUNTER — Ambulatory Visit (HOSPITAL_COMMUNITY)
Admission: RE | Admit: 2015-09-05 | Discharge: 2015-09-05 | Disposition: A | Discharge status: Home / Self Care | Payer: Medicare HMO | Source: Ambulatory Visit | Attending: Oncology | Admitting: Oncology

## 2015-09-05 ENCOUNTER — Other Ambulatory Visit: Payer: Medicare HMO

## 2015-09-05 DIAGNOSIS — C7931 Secondary malignant neoplasm of brain: Secondary | ICD-10-CM | POA: Insufficient documentation

## 2015-09-05 DIAGNOSIS — C649 Malignant neoplasm of unspecified kidney, except renal pelvis: Secondary | ICD-10-CM

## 2015-09-05 DIAGNOSIS — R51 Headache: Secondary | ICD-10-CM | POA: Diagnosis not present

## 2015-09-05 DIAGNOSIS — E785 Hyperlipidemia, unspecified: Secondary | ICD-10-CM | POA: Diagnosis not present

## 2015-09-05 DIAGNOSIS — D649 Anemia, unspecified: Secondary | ICD-10-CM | POA: Diagnosis not present

## 2015-09-05 DIAGNOSIS — N2889 Other specified disorders of kidney and ureter: Secondary | ICD-10-CM

## 2015-09-05 DIAGNOSIS — C719 Malignant neoplasm of brain, unspecified: Secondary | ICD-10-CM

## 2015-09-05 MED ORDER — DEXAMETHASONE 4 MG PO TABS: 4.0000 mg | ORAL_TABLET | Freq: Two times a day (BID) | ORAL | 0 refills | Status: DC

## 2015-09-05 MED ORDER — GADOBENATE DIMEGLUMINE 529 MG/ML IV SOLN
20.0000 mL | Freq: Once | INTRAVENOUS | Status: AC | PRN
  Administered 2015-09-05: 16 mL via INTRAVENOUS

## 2015-09-05 NOTE — Progress Notes (Signed)
The results of the MRI noted and discussed with Jacob Beck of the form today. He continues to be minimally symptomatic at this time with periodic headaches. He denied any seizures, syncope or altered mental status.  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 I discussed these strategies with him today and we'll start him on dexamethasone 4 mg twice a day to decrease vasogenic edema and improve his mild symptoms. I will also urgently refer him to radiation oncology to start whole brain radiation the near future.  He will have a follow-up appointment as well in the near future to follow-up on his clinical status.

## 2015-09-06 ENCOUNTER — Other Ambulatory Visit: Payer: Self-pay | Admitting: Oncology

## 2015-09-06 ENCOUNTER — Encounter: Payer: Self-pay | Admitting: *Deleted

## 2015-09-06 DIAGNOSIS — C649 Malignant neoplasm of unspecified kidney, except renal pelvis: Secondary | ICD-10-CM

## 2015-09-06 DIAGNOSIS — G8929 Other chronic pain: Secondary | ICD-10-CM

## 2015-09-06 DIAGNOSIS — N2889 Other specified disorders of kidney and ureter: Secondary | ICD-10-CM

## 2015-09-06 MED ORDER — HYDROCODONE-ACETAMINOPHEN 5-325 MG PO TABS: 2.0000 | ORAL_TABLET | ORAL | 0 refills | Status: DC | PRN

## 2015-09-11 ENCOUNTER — Encounter: Payer: Self-pay | Admitting: *Deleted

## 2015-09-11 ENCOUNTER — Encounter: Payer: Self-pay | Admitting: Oncology

## 2015-09-12 ENCOUNTER — Other Ambulatory Visit: Payer: Self-pay | Admitting: Oncology

## 2015-09-12 DIAGNOSIS — C649 Malignant neoplasm of unspecified kidney, except renal pelvis: Secondary | ICD-10-CM

## 2015-09-16 ENCOUNTER — Ambulatory Visit (HOSPITAL_BASED_OUTPATIENT_CLINIC_OR_DEPARTMENT_OTHER): Payer: Medicare HMO | Admitting: Oncology

## 2015-09-16 ENCOUNTER — Telehealth: Payer: Self-pay | Admitting: Oncology

## 2015-09-16 VITALS — BP 144/78 | HR 57 | Temp 97.6°F | Resp 18 | Ht 72.0 in | Wt 168.5 lb

## 2015-09-16 DIAGNOSIS — C7931 Secondary malignant neoplasm of brain: Secondary | ICD-10-CM | POA: Diagnosis not present

## 2015-09-16 DIAGNOSIS — R197 Diarrhea, unspecified: Secondary | ICD-10-CM

## 2015-09-16 DIAGNOSIS — R110 Nausea: Secondary | ICD-10-CM

## 2015-09-16 DIAGNOSIS — M791 Myalgia: Secondary | ICD-10-CM | POA: Diagnosis not present

## 2015-09-16 DIAGNOSIS — C649 Malignant neoplasm of unspecified kidney, except renal pelvis: Secondary | ICD-10-CM

## 2015-09-16 DIAGNOSIS — C642 Malignant neoplasm of left kidney, except renal pelvis: Secondary | ICD-10-CM | POA: Diagnosis not present

## 2015-09-16 NOTE — Telephone Encounter (Signed)
NO INFORMATION IN 8/14 LOS

## 2015-09-16 NOTE — Progress Notes (Signed)
Hematology and Oncology Follow Up Visit  Jacob Beck [id number] 1950/08/13 65 y.o. 09/16/2015 9:54 AM   Principle Diagnosis: 65 year old gentleman with renal cell carcinoma diagnosed in April 2016. He presented with a large tumor on the left kidney measuring 15 cm and extending into the retroperitoneum causing proximal renal vein thrombosis.  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   Prior Therapy: Attempted surgical resection on 06/01/2014 that was unsuccessful given the size of tumor.  Current therapy:  Votrient 800 mg daily started on 06/07/2014. Under consideration to start radiation therapy to the brain.   Interim History: Jacob Beck presents today for a follow-up visit with his wife. Since the last visit, he developed recurrent headaches which prompted obtaining an MRI on 09/05/2015. His MRI showed multiple brain metastasis associated with vasogenic edema. He did not report any neurological deficits including syncope or seizures. He denied any blurry vision. He denied any weakness or confusion. He was started on dexamethasone 4 mg twice a day and referred to radiation oncology. His appointment with radiation scheduled for 09/18/2015.  Since the start of dexamethasone, his symptoms have improved dramatically. He is no longer reporting any headaches. Overall his health is about the same. He denied any toxicity associated with dexamethasone. He does not report any dyspepsia, or actually edema or neurological deficits. He is eating well and continues to exercise regularly.  He continues to take Votrient without any major complications at this time. He denied any nausea, vomiting or abdominal pain. He denied any diarrhea or excessive fatigue. He does use hydrocodone periodically for generalized pain.   He does not report any fevers, chills or sweats. He does not report any chest pain, palpitation, orthopnea, leg edema. He does not report any cough, hemoptysis, hematemesis. He does  not report any early satiety or abdominal distention. He does not report any skeletal pains to his back pain shoulder pain or hip pain. He does not report any lymphadenopathy or petechiae. Remainder of review of systems unremarkable.   Medications: I have reviewed the patient's current medications.  Current Outpatient Prescriptions  Medication Sig Dispense Refill  . dexamethasone (DECADRON) 4 MG tablet Take 1 tablet (4 mg total) by mouth 2 (two) times daily. 60 tablet 0  . HYDROcodone-acetaminophen (NORCO/VICODIN) 5-325 MG tablet Take 2 tablets by mouth every 4 (four) hours as needed for moderate pain. 90 tablet 0  . LORazepam (ATIVAN) 1 MG tablet Take 2 tablets on arrival to radiology, for MRI 5 tablet 0  . Multiple Vitamins-Minerals (MULTIVITAMIN WITH MINERALS) tablet Take 1 tablet by mouth daily.    . ondansetron (ZOFRAN) 4 MG tablet Take 1 tablet (4 mg total) by mouth every 8 (eight) hours as needed for nausea or vomiting. 20 tablet 0  . pazopanib (VOTRIENT) 200 MG tablet TAKE 4 TABLETS (800MG ) ORALLY ONCE DAILY ON AN EMPTY STOMACH (AT LEAST1 HOUR BEFORE OR 2 HOURS AFTER A MEAL). SWALLOW WHOLE. DO NOT CRUSH. 120 tablet 0  . prochlorperazine (COMPAZINE) 10 MG tablet Take 1 tablet (10 mg total) by mouth every 6 (six) hours as needed for nausea or vomiting. 30 tablet 1  . temazepam (RESTORIL) 30 MG capsule Take 1 capsule (30 mg total) by mouth at bedtime as needed for sleep. 30 capsule 0   No current facility-administered medications for this visit.      Allergies:  Allergies  Allergen Reactions  . Oxycodone Other (See Comments)    "halucinations"    Past Medical History, Surgical history, Social history, and  Family History were reviewed and updated.   Physical Exam: Blood pressure (!) 144/78, pulse (!) 57, temperature 97.6 F (36.4 C), temperature source Oral, resp. rate 18, height 6' (1.829 m), weight 168 lb 8 oz (76.4 kg), SpO2 100 %. ECOG: 1 General appearance: Well-appearing  gentleman without distress. Head: Normocephalic, without obvious abnormality. No oral thrush or ulcers noted. Neck: no adenopathy no thyroid masses. Lymph nodes: Cervical, supraclavicular, and axillary nodes normal. Heart:regular rate and rhythm, S1, S2 normal, no murmur, click, rub or gallop Lung:chest clear, no wheezing, rales, normal symmetric air entry Abdomin: soft, non-tender, without masses or organomegaly no rebound or guarding. EXT:no erythema, induration, or nodules Neurological examination: No deficits noted. He had no motor, sensory of deep tendon reflexes abnormalities.   Lab Results: Lab Results  Component Value Date   WBC 4.5 08/13/2015   HGB 11.9 (L) 08/13/2015   HCT 38.1 (L) 08/13/2015   MCV 96.7 08/13/2015   PLT 249 08/13/2015     Chemistry      Component Value Date/Time   NA 140 08/13/2015 0838   K 4.9 08/13/2015 0838   CL 105 10/15/2014 0932   CO2 29 08/13/2015 0838   BUN 25.7 08/13/2015 0838   CREATININE 0.9 08/13/2015 0838      Component Value Date/Time   CALCIUM 9.9 08/13/2015 0838   ALKPHOS 131 08/13/2015 0838   AST 20 08/13/2015 0838   ALT 18 08/13/2015 0838   BILITOT 0.38 08/13/2015 0838      EXAM: MRI HEAD WITHOUT AND WITH CONTRAST  TECHNIQUE: Multiplanar, multiecho pulse sequences of the brain and surrounding structures were obtained without and with intravenous contrast.  CONTRAST:  59mL MULTIHANCE GADOBENATE DIMEGLUMINE 529 MG/ML IV SOLN  COMPARISON:  None.  FINDINGS: A total of 8 hemorrhagic foci are present, compatible with hemorrhagic metastases. Surrounding T2 changes are evident. There is also associated enhancement with each of these lesions.  The largest lesion is in the anterior right frontal lobe measuring 12 x 18 x 11 mm.  A 5 mm lesion is present in the left cerebellum.  A 4 mm inferior left temporal lobe lesion is present.  A more lateral 6 mm right frontal lobe lesion is present on image 35.  A 7 mm  right parietal lesion is present on image 41 of series 11.  A more superior right frontal lobe lesion measures 8 mm on image 43.  A 6 mm lesion is present in the high right posterior frontal lobe.  A 7 mm high posterior left frontal lobe lesion is present.  There are multiple additional punctate foci of enhancement without associated hemorrhage, also likely representing metastatic disease. T2 changes associated with the nonhemorrhagic lesions are less profound.  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 The brainstem and cerebellum are otherwise unremarkable.  Internal auditory canals are within normal limits bilaterally. Flow is present in the major intracranial arteries. The globes and orbits are intact.  Polyps or mucous retention cysts are present along the floor of the maxillary sinuses bilaterally. There is mild mucosal thickening along the floor of maxillary sinuses as well, left greater than right. The remaining paranasal sinuses and the mastoid air cells are clear.  Skullbase is within normal limits. Midline sagittal images are otherwise unremarkable.  IMPRESSION: 1. Multiple brain metastases as described above. 2. At least 8 of the lesions demonstrate hemorrhage, typical of renal cell carcinoma metastases. 3. Vasogenic edema associated with the metastases is most profound about those lesions  which exhibit blood products.   Impression and Plan:   65 year old gentleman with the following issues:  1.Renal cell carcinoma presented with a large mass measuring 15.0 x 14.1 x 9.9 cm left renal mass extending off the upper pole of the left kidney into the retroperitoneum. He has also evidence of lymphadenopathy and small pulmonary nodules. He presented with abdominal pain and 20 pound weight loss. This is a biopsy proven to be renal cell carcinoma with a biopsy done on 05/04/2014. Attempted surgical resection for debulking purposes was  unsuccessful.  He is currently on Votrient 800 mg daily and is tolerating that dose well. CT scan on 05/14/2015 showed relatively stable disease. The plan is to repeat imaging studies in September 2017 and switch to a different salvage therapy if he has progression of disease. His CNS progression, does not warrant change of therapy at this time.    2. Brain metastasis: Detected on MRI on 09/05/2015. He is currently on dexamethasone and received palliative radiation therapy in the near future. He has no neurological deficits and the plan to start dexamethasone taper upon completion of radiation therapy.  3. Diffuse body pain: Very limited at this time and uses hydrocodone infrequently. I encouraged him to use hydrocodone more than once a day if needed to and a switch to oxycodone could be possible if this strategy is ineffective.  4. Nausea: It has been episodic unlikely related to Votrient. I have refilled his Compazine and Zofran with instructions how to use them.  5. Diarrhea: Appears to be manageable at this time on Votrient.  6. Liver function surveillance: No abnormalities in his liver function test on 08/13/2015.  7. Prognosis: This was discussed today in detail. He understand he has an incurable malignancy and CNS metastasis carries a worse prognosis. However, he remains in excellent health and performance status and aggressive therapy continues to be warranted.  6. Follow-up: Will be in 4 weeks after a CT scan.   [facility], MD 8/14/20179:54 AM

## 2015-09-17 NOTE — Progress Notes (Signed)
Location/Histology of Brain Tumor:  Multiple 8 hemorrhagic brain  metastasis   Primary renal cell carcinoma   Patient presented with symptoms of: Recurrent  Headaches MRI done 09/05/15   Past or anticipated interventions, if any, per neurosurgery: no  Past or anticipated interventions, if any, per medical oncology: Dr. Alen Blew visit 09/16/15 referral  radiation  Dose of Decadron, if applicable: 4mg   2x day   Recent neurologic symptoms, if any:   Seizures: NO  Headaches:  Slight h/a last night  Frontal   Nausea: NO  Dizziness/ataxia: slight dizzy ness at times   Difficulty with hand coordination: NO  Focal numbness/weakness: NO  Visual deficits/changes: NO  Confusion/Memory deficits: NO  Painful bone metastases at present, if any: NO  SAFETY ISSUES:  Prior radiation? NO  Pacemaker/ICD? NO  Is the patient on methotrexate? NO  Additional Complaints / other details: Married, 2 stepchildren, brother radiation for prostate ca, living,   Allergies:Oxycodone=Hallucinations=patient tates  When he was given too many at one time, has taken after one time no hallucinations BP 126/64 (BP Location: Left Arm, Patient Position: Sitting, Cuff Size: Normal)   Pulse 60   Temp 97.9 F (36.6 C) (Oral)   Resp 20   Ht 6' (1.829 m)   Wt 163 lb 4.8 oz (74.1 kg)   SpO2 99%   BMI 22.15 kg/m   Wt Readings from Last 3 Encounters:  09/18/15 163 lb 4.8 oz (74.1 kg)  09/16/15 168 lb 8 oz (76.4 kg)  08/13/15 169 lb 11.2 oz (77 kg)

## 2015-09-18 ENCOUNTER — Encounter: Payer: Self-pay | Admitting: Radiation Oncology

## 2015-09-18 ENCOUNTER — Ambulatory Visit: Payer: Medicare HMO | Admitting: Radiation Oncology

## 2015-09-18 ENCOUNTER — Ambulatory Visit
Admission: RE | Admit: 2015-09-18 | Discharge: 2015-09-18 | Disposition: A | Discharge status: Home / Self Care | Payer: Medicare HMO | Source: Ambulatory Visit | Attending: Radiation Oncology | Admitting: Radiation Oncology

## 2015-09-18 VITALS — BP 126/64 | HR 60 | Temp 97.9°F | Resp 20 | Ht 72.0 in | Wt 163.3 lb

## 2015-09-18 DIAGNOSIS — C7931 Secondary malignant neoplasm of brain: Secondary | ICD-10-CM | POA: Diagnosis not present

## 2015-09-18 DIAGNOSIS — Z510 Encounter for antineoplastic radiation therapy: Secondary | ICD-10-CM | POA: Diagnosis not present

## 2015-09-18 DIAGNOSIS — R51 Headache: Secondary | ICD-10-CM | POA: Diagnosis not present

## 2015-09-18 DIAGNOSIS — C649 Malignant neoplasm of unspecified kidney, except renal pelvis: Secondary | ICD-10-CM | POA: Insufficient documentation

## 2015-09-18 DIAGNOSIS — C641 Malignant neoplasm of right kidney, except renal pelvis: Secondary | ICD-10-CM | POA: Diagnosis not present

## 2015-09-18 DIAGNOSIS — C7949 Secondary malignant neoplasm of other parts of nervous system: Principal | ICD-10-CM

## 2015-09-18 DIAGNOSIS — C642 Malignant neoplasm of left kidney, except renal pelvis: Secondary | ICD-10-CM

## 2015-09-18 DIAGNOSIS — F17210 Nicotine dependence, cigarettes, uncomplicated: Secondary | ICD-10-CM | POA: Insufficient documentation

## 2015-09-18 DIAGNOSIS — R69 Illness, unspecified: Secondary | ICD-10-CM | POA: Diagnosis not present

## 2015-09-18 NOTE — Progress Notes (Signed)
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 A diagnosis of Renal cell carcinoma, left ([facility]) was also pertinent to this visit.    HISTORY OF PRESENT ILLNESS::Jacob Beck is a 65 y.o. male who is seen for an initial consultation visit. He developed left-sided flank pain, midepigastric abdominal pain, and suprapubic pain in March 2016. He had a CT scan 04/23/2014 which revealed a 15 cm mass arising from the upper pole of his left kidney extending into the retroperitoneum. He had a biopsy 05/04/2014 that confirmed the presence of renal cell carcinoma clear cell subtype. No lymphadenopathy noted in the chest. He was referred to Dr. Alen Blew in April 2016. He had an attempted surgical resection 06/01/2014 at [facility] that was unsuccessful given the size of the tumor. They did not remove any of the tumor. He started Votrient 800 mg daily 06/07/2014 with Dr. Alen Blew.  He started having recurrent headaches that prompted a brain MRI. This was performed 09/05/2015 and showed multiple brain metastases, at least 8 of the lesion demonstrate hemorrhage. He started dexamethasone 09/06/2015 and has been taking 4 mg twice daily to decrease vasogenic edema and improve his mild symptoms. He has been referred to radiation oncology to discuss options for treatment.  PREVIOUS RADIATION THERAPY: No   PAST MEDICAL HISTORY:  Past Medical History:  Diagnosis Date  . Cancer ([place])    renal-left  . GERD (gastroesophageal reflux disease)    occ  . Renal mass      PAST SURGICAL HISTORY: Past Surgical History:  Procedure Laterality Date  . FOOT SURGERY Right 2010   tendon donor  . INGUINAL HERNIA REPAIR Right 10/15/2014     Procedure: RIGHT INGUINAL HERNIA REPAIR WITH MESH;  Surgeon: Rolm Bookbinder, MD;  Location: [place];  Service: General;  Laterality: Right;  . INSERTION OF MESH Right 10/15/2014   Procedure: INSERTION OF MESH;  Surgeon: Rolm Bookbinder, MD;  Location: [place];  Service: General;  Laterality: Right;  . KIDNEY SURGERY Right 3/16   attempted removal of renal mass but decided to dangerous when opened abdomen     FAMILY HISTORY:  Family History  Problem Relation Age of Onset  . Prostate cancer Brother       SOCIAL HISTORY:  reports that he quit smoking about 15 years ago. His smoking use included Cigarettes. He has a 10.00 pack-year smoking history. He does not have any smokeless tobacco history on file. He reports that he does not drink alcohol or use drugs.   ALLERGIES: Oxycodone   MEDICATIONS:  Current Outpatient Prescriptions  Medication Sig Dispense Refill  . dexamethasone (DECADRON) 4 MG tablet Take 1 tablet (4 mg total) by mouth 2 (two) times daily. 60 tablet 0  . HYDROcodone-acetaminophen (NORCO/VICODIN) 5-325 MG tablet Take 2 tablets by mouth every 4 (four) hours as needed for moderate pain. 90 tablet 0  . loperamide (IMODIUM A-D) 2 MG tablet Take 2 mg by mouth as needed for diarrhea or loose stools.    . Multiple Vitamins-Minerals (MULTIVITAMIN WITH MINERALS) tablet Take 1 tablet by mouth daily.    . ondansetron (ZOFRAN) 4 MG tablet Take 1 tablet (4 mg total) by mouth every 8 (eight)  hours as needed for nausea or vomiting. 20 tablet 0  . pazopanib (VOTRIENT) 200 MG tablet TAKE 4 TABLETS (800MG ) ORALLY ONCE DAILY ON AN EMPTY STOMACH (AT LEAST1 HOUR BEFORE OR 2 HOURS AFTER A MEAL). SWALLOW WHOLE. DO NOT CRUSH. 120 tablet 0  . prochlorperazine (COMPAZINE) 10 MG tablet Take 1 tablet (10 mg total) by mouth every 6 (six) hours as needed for nausea or vomiting. 30 tablet 1  . temazepam (RESTORIL) 30 MG capsule Take 1 capsule (30 mg total) by mouth at bedtime as needed for sleep. 30  capsule 0  . LORazepam (ATIVAN) 1 MG tablet Take 2 tablets on arrival to radiology, for MRI (Patient not taking: Reported on 09/18/2015) 5 tablet 0   No current facility-administered medications for this encounter.      REVIEW OF SYSTEMS: On review of symptoms, he denies seizures, nausea, difficulty with hand coordination, focal numbness or weakness, visual deficits or changes, and confusion or memory deficits. He mentions he had a slight headache last night in the frontal region. He mentions he has some slight dizziness at times. He mentions his headaches have improved since starting the dexamethasone. He reports he had an episode of blurry vision when he was working out in the yard.  He denies nausea. He mentions he did have some ear popping but hasn't had this recently. He reports he has had some abdominal pain that he attributes to treatment. A complete review of systems is obtained and is otherwise negative.     PHYSICAL EXAM:  height is 6' (1.829 m) and weight is 163 lb 4.8 oz (74.1 kg). His oral temperature is 97.9 F (36.6 C). His blood pressure is 126/64 and his pulse is 60. His respiration is 20 and oxygen saturation is 99%.    In general this is a well appearing caucasian male in no acute distress. He is alert and oriented x4 and appropriate throughout the examination. HEENT reveals that the patient is normocephalic, atraumatic. EOMs are intact. PERRLA.  Cranial nerves II-XII are intact. Strength of upper and lower extremities are 5/5, and DTRs are equilateral of the brachial and patellar regions. Skin is intact without any evidence of Tipler lesions. Cardiovascular exam reveals a regular rate and rhythm, no clicks rubs or murmurs are auscultated. Chest is clear to auscultation bilaterally. Lymphatic assessment is performed and does not reveal any adenopathy in the cervical, supraclavicular, axillary, or inguinal chains. Abdomen has active bowel sounds in all quadrants and is intact. The  abdomen is soft, non tender, non distended. Lower extremities are negative for pretibial pitting edema, deep calf tenderness, cyanosis or clubbing.  ECOG = 1   0 - Asymptomatic (Fully active, able to carry on all predisease activities without restriction)  1 - Symptomatic but completely ambulatory (Restricted in physically strenuous activity but ambulatory and able to carry out work of a light or sedentary nature. For example, light housework, office work)  2 - Symptomatic, <50% in bed during the day (Ambulatory and capable of all self care but unable to carry out any work activities. Up and about more than 50% of waking hours)  3 - Symptomatic, >50% in bed, but not bedbound (Capable of only limited self-care, confined to bed or chair 50% or more of waking hours)  4 - Bedbound (Completely disabled. Cannot carry on any self-care. Totally confined to bed or chair)  5 - Death   Eustace Pen MM, Creech RH, Tormey DC, et al. [phone]). "Toxicity and response criteria of the [place]  Cooperative Oncology Group". Datil Oncol. 5 (6): 649-55     LABORATORY DATA:  Lab Results  Component Value Date   WBC 4.5 08/13/2015   HGB 11.9 (L) 08/13/2015   HCT 38.1 (L) 08/13/2015   MCV 96.7 08/13/2015   PLT 249 08/13/2015   Lab Results  Component Value Date   NA 140 08/13/2015   K 4.9 08/13/2015   CL 105 10/15/2014   CO2 29 08/13/2015   Lab Results  Component Value Date   ALT 18 08/13/2015   AST 20 08/13/2015   ALKPHOS 131 08/13/2015   BILITOT 0.38 08/13/2015      RADIOGRAPHY: Mr Jeri Cos [id number] Contrast  Result Date: 09/05/2015 CLINICAL DATA:  Renal cell carcinoma. Persistent headaches over the last 2 months. EXAM: MRI HEAD WITHOUT AND WITH CONTRAST TECHNIQUE: Multiplanar, multiecho pulse sequences of the brain and surrounding structures were obtained without and with intravenous contrast. CONTRAST:  79mL MULTIHANCE GADOBENATE DIMEGLUMINE 529 MG/ML IV SOLN COMPARISON:  None. FINDINGS: A total of 8  hemorrhagic foci are present, compatible with hemorrhagic metastases. Surrounding T2 changes are evident. There is also associated enhancement with each of these lesions. The largest lesion is in the anterior right frontal lobe measuring 12 x 18 x 11 mm. A 5 mm lesion is present in the left cerebellum. A 4 mm inferior left temporal lobe lesion is present. A more lateral 6 mm right frontal lobe lesion is present on image 35. A 7 mm right parietal lesion is present on image 41 of series 11. A more superior right frontal lobe lesion measures 8 mm on image 43. A 6 mm lesion is present in the high right posterior frontal lobe. A 7 mm high posterior left frontal lobe lesion is present. There are multiple additional punctate foci of enhancement without associated hemorrhage, also likely representing metastatic disease. T2 changes associated with the nonhemorrhagic lesions are less profound. 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 The brainstem and cerebellum are otherwise unremarkable. Internal auditory canals are within normal limits bilaterally. Flow is present in the major intracranial arteries. The globes and orbits are intact. Polyps or mucous retention cysts are present along the floor of the maxillary sinuses bilaterally. There is mild mucosal thickening along the floor of maxillary sinuses as well, left greater than right. The remaining paranasal sinuses and the mastoid air cells are clear. Skullbase is within normal limits. Midline sagittal images are otherwise unremarkable. IMPRESSION: 1. Multiple brain metastases as described above. 2. At least 8 of the lesions demonstrate hemorrhage, typical of renal cell carcinoma metastases. 3. Vasogenic edema associated with the metastases is most profound about those lesions which exhibit blood products. Electronically Signed   By: San Morelle M.D.   On: 09/05/2015 11:56       IMPRESSION/PLAN: 1. Renal Cell Carcinoma with metastatic  disease to the brain. Dr. Lisbeth Renshaw discusses the findings from the patient's recent MRI scan and discussed whole brain radiation to palliate his disease. He outlines the options for SRS if he has progressive disease following treatment, as this can be a radioresistant type of malignancy. He discusses the logistics and delivery and reviews the risks, benefits, short and long term  effects. Dr. Lisbeth Renshaw recommends continuing dexamethasone at 4mg  BID. We will start tapering this during treatment. And Dr. Lisbeth Renshaw recommends proceeding with 10 fractions over 2 weeks. We will plan simulation today and anticipate radiation would begin next week. He is going out of town on 10/11/15. 2.  Headaches. As above this has been helped significantly by dexamethasone. We will plan to taper as he finishes treatment.  The above documentation reflects my direct findings during this shared patient visit. Please see the separate note by Dr. Lisbeth Renshaw on this date for the remainder of the patient's plan of care.     Carola Rhine, PAC

## 2015-09-18 NOTE — Progress Notes (Signed)
Please see the Nurse Progress Note in the MD Initial Consult Encounter for this patient. 

## 2015-09-19 ENCOUNTER — Ambulatory Visit
Admission: RE | Admit: 2015-09-19 | Discharge: 2015-09-19 | Disposition: A | Discharge status: Home / Self Care | Payer: Medicare HMO | Source: Ambulatory Visit | Attending: Radiation Oncology | Admitting: Radiation Oncology

## 2015-09-19 DIAGNOSIS — C641 Malignant neoplasm of right kidney, except renal pelvis: Secondary | ICD-10-CM | POA: Diagnosis not present

## 2015-09-19 DIAGNOSIS — C642 Malignant neoplasm of left kidney, except renal pelvis: Secondary | ICD-10-CM | POA: Diagnosis not present

## 2015-09-19 DIAGNOSIS — R51 Headache: Secondary | ICD-10-CM | POA: Diagnosis not present

## 2015-09-19 DIAGNOSIS — C7931 Secondary malignant neoplasm of brain: Secondary | ICD-10-CM | POA: Diagnosis not present

## 2015-09-19 DIAGNOSIS — R69 Illness, unspecified: Secondary | ICD-10-CM | POA: Diagnosis not present

## 2015-09-19 DIAGNOSIS — Z510 Encounter for antineoplastic radiation therapy: Secondary | ICD-10-CM | POA: Diagnosis not present

## 2015-09-19 DIAGNOSIS — C7949 Secondary malignant neoplasm of other parts of nervous system: Secondary | ICD-10-CM | POA: Diagnosis not present

## 2015-09-23 ENCOUNTER — Ambulatory Visit
Admission: RE | Admit: 2015-09-23 | Discharge: 2015-09-23 | Disposition: A | Discharge status: Home / Self Care | Payer: Medicare HMO | Source: Ambulatory Visit | Attending: Radiation Oncology | Admitting: Radiation Oncology

## 2015-09-23 DIAGNOSIS — Z510 Encounter for antineoplastic radiation therapy: Secondary | ICD-10-CM | POA: Diagnosis not present

## 2015-09-23 DIAGNOSIS — C7949 Secondary malignant neoplasm of other parts of nervous system: Secondary | ICD-10-CM | POA: Diagnosis not present

## 2015-09-23 DIAGNOSIS — R51 Headache: Secondary | ICD-10-CM | POA: Diagnosis not present

## 2015-09-23 DIAGNOSIS — C641 Malignant neoplasm of right kidney, except renal pelvis: Secondary | ICD-10-CM | POA: Diagnosis not present

## 2015-09-23 DIAGNOSIS — C642 Malignant neoplasm of left kidney, except renal pelvis: Secondary | ICD-10-CM | POA: Diagnosis not present

## 2015-09-23 DIAGNOSIS — C7931 Secondary malignant neoplasm of brain: Secondary | ICD-10-CM | POA: Diagnosis not present

## 2015-09-23 DIAGNOSIS — R69 Illness, unspecified: Secondary | ICD-10-CM | POA: Diagnosis not present

## 2015-09-24 ENCOUNTER — Encounter: Payer: Self-pay | Admitting: Oncology

## 2015-09-24 ENCOUNTER — Ambulatory Visit: Payer: Medicare HMO | Admitting: Radiation Oncology

## 2015-09-24 ENCOUNTER — Ambulatory Visit
Admission: RE | Admit: 2015-09-24 | Discharge: 2015-09-24 | Disposition: A | Discharge status: Home / Self Care | Payer: Medicare HMO | Source: Ambulatory Visit | Attending: Radiation Oncology | Admitting: Radiation Oncology

## 2015-09-24 DIAGNOSIS — R69 Illness, unspecified: Secondary | ICD-10-CM | POA: Diagnosis not present

## 2015-09-24 DIAGNOSIS — Z510 Encounter for antineoplastic radiation therapy: Secondary | ICD-10-CM | POA: Diagnosis not present

## 2015-09-24 DIAGNOSIS — C7949 Secondary malignant neoplasm of other parts of nervous system: Secondary | ICD-10-CM | POA: Diagnosis not present

## 2015-09-24 DIAGNOSIS — C642 Malignant neoplasm of left kidney, except renal pelvis: Secondary | ICD-10-CM | POA: Diagnosis not present

## 2015-09-24 DIAGNOSIS — C7931 Secondary malignant neoplasm of brain: Secondary | ICD-10-CM | POA: Diagnosis not present

## 2015-09-24 DIAGNOSIS — R51 Headache: Secondary | ICD-10-CM | POA: Diagnosis not present

## 2015-09-25 ENCOUNTER — Ambulatory Visit
Admission: RE | Admit: 2015-09-25 | Discharge: 2015-09-25 | Disposition: A | Discharge status: Home / Self Care | Payer: Medicare HMO | Source: Ambulatory Visit | Attending: Radiation Oncology | Admitting: Radiation Oncology

## 2015-09-25 ENCOUNTER — Encounter: Payer: Self-pay | Admitting: Skilled Nursing Facility1

## 2015-09-25 DIAGNOSIS — C7949 Secondary malignant neoplasm of other parts of nervous system: Secondary | ICD-10-CM | POA: Diagnosis not present

## 2015-09-25 DIAGNOSIS — C7931 Secondary malignant neoplasm of brain: Secondary | ICD-10-CM | POA: Diagnosis not present

## 2015-09-25 DIAGNOSIS — R69 Illness, unspecified: Secondary | ICD-10-CM | POA: Diagnosis not present

## 2015-09-25 DIAGNOSIS — Z510 Encounter for antineoplastic radiation therapy: Secondary | ICD-10-CM | POA: Diagnosis not present

## 2015-09-25 DIAGNOSIS — R51 Headache: Secondary | ICD-10-CM | POA: Diagnosis not present

## 2015-09-25 DIAGNOSIS — C642 Malignant neoplasm of left kidney, except renal pelvis: Secondary | ICD-10-CM | POA: Diagnosis not present

## 2015-09-25 MED ORDER — SONAFINE EX EMUL: 1.0000 "application " | Freq: Two times a day (BID) | CUTANEOUS | Status: DC

## 2015-09-25 MED ORDER — SONAFINE EX EMUL
1.0000 "application " | Freq: Two times a day (BID) | CUTANEOUS | Status: DC
  Administered 2015-09-25 (×2): 1 via TOPICAL

## 2015-09-25 NOTE — Progress Notes (Signed)
To assist the pt in identifying dietary strategies to gain lost wt.   Pt identified as being malnourished due to lost wt. Pts phone is not working with no ringing.   Make Ernestene Kiel CSO, RD,LDN aware of this.

## 2015-09-26 ENCOUNTER — Ambulatory Visit
Admission: RE | Admit: 2015-09-26 | Discharge: 2015-09-26 | Disposition: A | Discharge status: Home / Self Care | Payer: Medicare HMO | Source: Ambulatory Visit | Attending: Radiation Oncology | Admitting: Radiation Oncology

## 2015-09-26 ENCOUNTER — Other Ambulatory Visit: Payer: Self-pay | Admitting: Oncology

## 2015-09-26 DIAGNOSIS — C7931 Secondary malignant neoplasm of brain: Secondary | ICD-10-CM | POA: Diagnosis not present

## 2015-09-26 DIAGNOSIS — C7949 Secondary malignant neoplasm of other parts of nervous system: Secondary | ICD-10-CM | POA: Diagnosis not present

## 2015-09-26 DIAGNOSIS — Z510 Encounter for antineoplastic radiation therapy: Secondary | ICD-10-CM | POA: Diagnosis not present

## 2015-09-26 DIAGNOSIS — C642 Malignant neoplasm of left kidney, except renal pelvis: Secondary | ICD-10-CM | POA: Diagnosis not present

## 2015-09-26 DIAGNOSIS — R69 Illness, unspecified: Secondary | ICD-10-CM | POA: Diagnosis not present

## 2015-09-26 DIAGNOSIS — R51 Headache: Secondary | ICD-10-CM | POA: Diagnosis not present

## 2015-09-26 DIAGNOSIS — D3000 Benign neoplasm of unspecified kidney: Secondary | ICD-10-CM

## 2015-09-26 MED ORDER — TEMAZEPAM 30 MG PO CAPS: 30.0000 mg | ORAL_CAPSULE | Freq: Every evening | ORAL | 0 refills | Status: DC | PRN

## 2015-09-27 ENCOUNTER — Ambulatory Visit
Admission: RE | Admit: 2015-09-27 | Discharge: 2015-09-27 | Disposition: A | Discharge status: Home / Self Care | Payer: Medicare HMO | Source: Ambulatory Visit | Attending: Radiation Oncology | Admitting: Radiation Oncology

## 2015-09-27 ENCOUNTER — Encounter: Payer: Self-pay | Admitting: Radiation Oncology

## 2015-09-27 VITALS — BP 129/73 | HR 65 | Temp 97.5°F | Ht 72.0 in | Wt 168.2 lb

## 2015-09-27 DIAGNOSIS — C7949 Secondary malignant neoplasm of other parts of nervous system: Secondary | ICD-10-CM | POA: Diagnosis not present

## 2015-09-27 DIAGNOSIS — R51 Headache: Secondary | ICD-10-CM | POA: Diagnosis not present

## 2015-09-27 DIAGNOSIS — C642 Malignant neoplasm of left kidney, except renal pelvis: Secondary | ICD-10-CM

## 2015-09-27 DIAGNOSIS — R69 Illness, unspecified: Secondary | ICD-10-CM | POA: Diagnosis not present

## 2015-09-27 DIAGNOSIS — C7931 Secondary malignant neoplasm of brain: Secondary | ICD-10-CM | POA: Diagnosis not present

## 2015-09-27 DIAGNOSIS — Z510 Encounter for antineoplastic radiation therapy: Secondary | ICD-10-CM | POA: Diagnosis not present

## 2015-09-27 DIAGNOSIS — C641 Malignant neoplasm of right kidney, except renal pelvis: Secondary | ICD-10-CM | POA: Diagnosis not present

## 2015-09-27 NOTE — Progress Notes (Signed)
Department of Radiation Oncology  Phone:  [phone] Fax:        [phone]  Weekly Treatment Note    Name: Jacob Beck Date: 09/29/2015 MRN: [id number] DOB: [birth year]   Diagnosis:     ICD-9-CM ICD-10-CM   1. Renal cell carcinoma, left (HCC) 189.0 C64.2      Current dose: 15 Gy  Current fraction:5   MEDICATIONS: Current Outpatient Prescriptions  Medication Sig Dispense Refill  . dexamethasone (DECADRON) 4 MG tablet Take 1 tablet (4 mg total) by mouth 2 (two) times daily. 60 tablet 0  . HYDROcodone-acetaminophen (NORCO/VICODIN) 5-325 MG tablet Take 2 tablets by mouth every 4 (four) hours as needed for moderate pain. 90 tablet 0  . loperamide (IMODIUM A-D) 2 MG tablet Take 2 mg by mouth as needed for diarrhea or loose stools.    . Multiple Vitamins-Minerals (MULTIVITAMIN WITH MINERALS) tablet Take 1 tablet by mouth daily.    . ondansetron (ZOFRAN) 4 MG tablet Take 1 tablet (4 mg total) by mouth every 8 (eight) hours as needed for nausea or vomiting. 20 tablet 0  . pazopanib (VOTRIENT) 200 MG tablet TAKE 4 TABLETS (800MG ) ORALLY ONCE DAILY ON AN EMPTY STOMACH (AT LEAST1 HOUR BEFORE OR 2 HOURS AFTER A MEAL). SWALLOW WHOLE. DO NOT CRUSH. 120 tablet 0  . prochlorperazine (COMPAZINE) 10 MG tablet Take 1 tablet (10 mg total) by mouth every 6 (six) hours as needed for nausea or vomiting. 30 tablet 1  . temazepam (RESTORIL) 30 MG capsule Take 1 capsule (30 mg total) by mouth at bedtime as needed for sleep. 30 capsule 0  . Wound Dressings (SONAFINE EX) Apply 1 application topically daily.    Marland Kitchen LORazepam (ATIVAN) 1 MG tablet Take 2 tablets on arrival to radiology, for MRI (Patient not taking: Reported on 09/18/2015) 5 tablet 0   No current facility-administered medications for this encounter.      ALLERGIES: Oxycodone   LABORATORY DATA:  Lab Results  Component Value Date   WBC 4.5 08/13/2015   HGB 11.9 (L) 08/13/2015   HCT 38.1 (L) 08/13/2015   MCV 96.7 08/13/2015   PLT 249 08/13/2015   Lab Results  Component Value Date   NA 140 08/13/2015   K 4.9 08/13/2015   CL 105 10/15/2014   CO2 29 08/13/2015   Lab Results  Component Value Date   ALT 18 08/13/2015   AST 20 08/13/2015   ALKPHOS 131 08/13/2015   BILITOT 0.38 08/13/2015     NARRATIVE: Jacob Beck was seen today for weekly treatment management. The chart was checked and the patient's films were reviewed.  Jacob Beck has completed 5 fractions to his whole brain.  He denies pain.  He does report having abdominal bloating which he thinks is due to taking decadron.  He has decreased his decadron dosage to 4 mg daily.  He is also taking Votrient.  He reports having some dizziness since starting radiation.  He denies having headaches or vision changes.  He reports having slight fatigue.  The skin on his forehead/scalp is intact.  He is using sonafine.  PHYSICAL EXAMINATION: height is 6' (1.829 m) and weight is 168 lb 3.2 oz (76.3 kg). His oral temperature is 97.5 F (36.4 C). His blood pressure is 129/73 and his pulse is 65. His oxygen saturation is 97%.   No oral thrush noted.  ASSESSMENT: The patient is doing satisfactorily with treatment.  PLAN: We will continue with the patient's radiation treatment as planned.  I advised Gas-X for his abdominal bloating and keep his current dosage of steroids.  This document serves as a record of services personally performed by Kyung Rudd, MD. It was created on his behalf by Darcus Austin, a trained medical scribe. The creation of this record is based on the scribe's personal observations and the provider's statements to them. This document has been checked and approved by the attending provider.

## 2015-09-27 NOTE — Progress Notes (Signed)
Jacob Beck has completed 5 fractions to his whole brain.  He denies pain.  He does report having abdominal bloating which he thinks is due to taking decadron.  He has decreased his decadron dosage to 4 mg daily.  He is also taking Votrient.  He reports having some dizziness since starting radiation.  He denies having headaches or vision changes.  He reports having slight fatigue.  The skin on his forehead/scalp is intact.  He is using sonafine.  BP 129/73 (BP Location: Right Arm, Patient Position: Sitting)   Pulse 65   Temp 97.5 F (36.4 C) (Oral)   Ht 6' (1.829 m)   Wt 168 lb 3.2 oz (76.3 kg)   SpO2 97%   BMI 22.81 kg/m    Wt Readings from Last 3 Encounters:  09/27/15 168 lb 3.2 oz (76.3 kg)  09/18/15 163 lb 4.8 oz (74.1 kg)  09/16/15 168 lb 8 oz (76.4 kg)

## 2015-09-30 ENCOUNTER — Ambulatory Visit
Admission: RE | Admit: 2015-09-30 | Discharge: 2015-09-30 | Disposition: A | Discharge status: Home / Self Care | Payer: Medicare HMO | Source: Ambulatory Visit | Attending: Radiation Oncology | Admitting: Radiation Oncology

## 2015-09-30 ENCOUNTER — Other Ambulatory Visit: Payer: Self-pay | Admitting: Oncology

## 2015-09-30 ENCOUNTER — Other Ambulatory Visit: Payer: Self-pay | Admitting: *Deleted

## 2015-09-30 ENCOUNTER — Encounter: Payer: Self-pay | Admitting: *Deleted

## 2015-09-30 DIAGNOSIS — C649 Malignant neoplasm of unspecified kidney, except renal pelvis: Secondary | ICD-10-CM

## 2015-09-30 DIAGNOSIS — R69 Illness, unspecified: Secondary | ICD-10-CM | POA: Diagnosis not present

## 2015-09-30 DIAGNOSIS — N2889 Other specified disorders of kidney and ureter: Secondary | ICD-10-CM

## 2015-09-30 DIAGNOSIS — C642 Malignant neoplasm of left kidney, except renal pelvis: Secondary | ICD-10-CM | POA: Diagnosis not present

## 2015-09-30 DIAGNOSIS — C7949 Secondary malignant neoplasm of other parts of nervous system: Secondary | ICD-10-CM | POA: Diagnosis not present

## 2015-09-30 DIAGNOSIS — Z510 Encounter for antineoplastic radiation therapy: Secondary | ICD-10-CM | POA: Diagnosis not present

## 2015-09-30 DIAGNOSIS — R51 Headache: Secondary | ICD-10-CM | POA: Diagnosis not present

## 2015-09-30 DIAGNOSIS — G8929 Other chronic pain: Secondary | ICD-10-CM

## 2015-09-30 DIAGNOSIS — C7931 Secondary malignant neoplasm of brain: Secondary | ICD-10-CM | POA: Diagnosis not present

## 2015-09-30 MED ORDER — PAZOPANIB HCL 200 MG PO TABS: ORAL_TABLET | ORAL | 0 refills | Status: DC

## 2015-10-01 ENCOUNTER — Ambulatory Visit
Admission: RE | Admit: 2015-10-01 | Discharge: 2015-10-01 | Disposition: A | Discharge status: Home / Self Care | Payer: Medicare HMO | Source: Ambulatory Visit | Attending: Radiation Oncology | Admitting: Radiation Oncology

## 2015-10-01 ENCOUNTER — Other Ambulatory Visit: Payer: Self-pay | Admitting: Oncology

## 2015-10-01 DIAGNOSIS — N2889 Other specified disorders of kidney and ureter: Secondary | ICD-10-CM

## 2015-10-01 DIAGNOSIS — G8929 Other chronic pain: Secondary | ICD-10-CM

## 2015-10-01 DIAGNOSIS — C7949 Secondary malignant neoplasm of other parts of nervous system: Secondary | ICD-10-CM | POA: Diagnosis not present

## 2015-10-01 DIAGNOSIS — C642 Malignant neoplasm of left kidney, except renal pelvis: Secondary | ICD-10-CM | POA: Diagnosis not present

## 2015-10-01 DIAGNOSIS — C7931 Secondary malignant neoplasm of brain: Secondary | ICD-10-CM | POA: Diagnosis not present

## 2015-10-01 DIAGNOSIS — Z510 Encounter for antineoplastic radiation therapy: Secondary | ICD-10-CM | POA: Diagnosis not present

## 2015-10-01 DIAGNOSIS — R69 Illness, unspecified: Secondary | ICD-10-CM | POA: Diagnosis not present

## 2015-10-01 DIAGNOSIS — C649 Malignant neoplasm of unspecified kidney, except renal pelvis: Secondary | ICD-10-CM

## 2015-10-01 DIAGNOSIS — R51 Headache: Secondary | ICD-10-CM | POA: Diagnosis not present

## 2015-10-02 ENCOUNTER — Ambulatory Visit
Admission: RE | Admit: 2015-10-02 | Discharge: 2015-10-02 | Disposition: A | Discharge status: Home / Self Care | Payer: Medicare HMO | Source: Ambulatory Visit | Attending: Radiation Oncology | Admitting: Radiation Oncology

## 2015-10-02 ENCOUNTER — Other Ambulatory Visit: Payer: Self-pay | Admitting: *Deleted

## 2015-10-02 DIAGNOSIS — N2889 Other specified disorders of kidney and ureter: Secondary | ICD-10-CM

## 2015-10-02 DIAGNOSIS — G8929 Other chronic pain: Secondary | ICD-10-CM

## 2015-10-02 DIAGNOSIS — Z510 Encounter for antineoplastic radiation therapy: Secondary | ICD-10-CM | POA: Diagnosis not present

## 2015-10-02 DIAGNOSIS — C7931 Secondary malignant neoplasm of brain: Secondary | ICD-10-CM | POA: Diagnosis not present

## 2015-10-02 DIAGNOSIS — R69 Illness, unspecified: Secondary | ICD-10-CM | POA: Diagnosis not present

## 2015-10-02 DIAGNOSIS — C7949 Secondary malignant neoplasm of other parts of nervous system: Secondary | ICD-10-CM | POA: Diagnosis not present

## 2015-10-02 DIAGNOSIS — C642 Malignant neoplasm of left kidney, except renal pelvis: Secondary | ICD-10-CM | POA: Diagnosis not present

## 2015-10-02 DIAGNOSIS — R51 Headache: Secondary | ICD-10-CM | POA: Diagnosis not present

## 2015-10-02 DIAGNOSIS — C649 Malignant neoplasm of unspecified kidney, except renal pelvis: Secondary | ICD-10-CM

## 2015-10-02 MED ORDER — HYDROCODONE-ACETAMINOPHEN 5-325 MG PO TABS: 2.0000 | ORAL_TABLET | ORAL | 0 refills | Status: DC | PRN

## 2015-10-03 ENCOUNTER — Ambulatory Visit
Admission: RE | Admit: 2015-10-03 | Discharge: 2015-10-03 | Disposition: A | Discharge status: Home / Self Care | Payer: Medicare HMO | Source: Ambulatory Visit | Attending: Radiation Oncology | Admitting: Radiation Oncology

## 2015-10-03 DIAGNOSIS — Z510 Encounter for antineoplastic radiation therapy: Secondary | ICD-10-CM | POA: Diagnosis not present

## 2015-10-03 DIAGNOSIS — R51 Headache: Secondary | ICD-10-CM | POA: Diagnosis not present

## 2015-10-03 DIAGNOSIS — R69 Illness, unspecified: Secondary | ICD-10-CM | POA: Diagnosis not present

## 2015-10-03 DIAGNOSIS — C642 Malignant neoplasm of left kidney, except renal pelvis: Secondary | ICD-10-CM | POA: Diagnosis not present

## 2015-10-03 DIAGNOSIS — C7931 Secondary malignant neoplasm of brain: Secondary | ICD-10-CM | POA: Diagnosis not present

## 2015-10-03 DIAGNOSIS — C7949 Secondary malignant neoplasm of other parts of nervous system: Secondary | ICD-10-CM | POA: Diagnosis not present

## 2015-10-04 ENCOUNTER — Encounter: Payer: Self-pay | Admitting: Radiation Oncology

## 2015-10-04 ENCOUNTER — Ambulatory Visit
Admission: RE | Admit: 2015-10-04 | Discharge: 2015-10-04 | Disposition: A | Discharge status: Home / Self Care | Payer: Medicare HMO | Source: Ambulatory Visit | Attending: Radiation Oncology | Admitting: Radiation Oncology

## 2015-10-04 ENCOUNTER — Ambulatory Visit: Payer: Medicare HMO

## 2015-10-04 DIAGNOSIS — C7949 Secondary malignant neoplasm of other parts of nervous system: Secondary | ICD-10-CM | POA: Diagnosis not present

## 2015-10-04 DIAGNOSIS — R69 Illness, unspecified: Secondary | ICD-10-CM | POA: Diagnosis not present

## 2015-10-04 DIAGNOSIS — C7931 Secondary malignant neoplasm of brain: Secondary | ICD-10-CM | POA: Insufficient documentation

## 2015-10-04 DIAGNOSIS — C642 Malignant neoplasm of left kidney, except renal pelvis: Secondary | ICD-10-CM | POA: Diagnosis not present

## 2015-10-04 DIAGNOSIS — R51 Headache: Secondary | ICD-10-CM | POA: Diagnosis not present

## 2015-10-04 DIAGNOSIS — C641 Malignant neoplasm of right kidney, except renal pelvis: Secondary | ICD-10-CM | POA: Diagnosis not present

## 2015-10-04 DIAGNOSIS — Z510 Encounter for antineoplastic radiation therapy: Secondary | ICD-10-CM | POA: Diagnosis not present

## 2015-10-04 NOTE — Progress Notes (Signed)
  Radiation Oncology         [phone]) [phone] ________________________________  Name: Jacob Beck MRN: [id number]  Date: 09/19/2015  DOB: 02-02-1951    Simulation and treatment planning note  DIAGNOSIS:     ICD-9-CM ICD-10-CM   1. Brain metastasis ([place]) 198.3 C79.31      The patient presented for simulation for the patient's upcoming course of whole brain radiation treatment. The patient was placed in a supine position and a customized thermoplastic head cast was constructed to aid in patient immobilization during the treatment. This complex treatment device will be used on a daily basis. In this fashion a CT scan was obtained through the head and neck region and isocenter was placed near midline within the brain.  The patient will be planned to receive a course of whole brain radiation treatment to a dose of 30 gray in 10 fractions at 3 gray per fraction. To accomplish this, 2 customized blocks have been designed which corresponds to left and right whole brain radiation fields. These 2 complex treatment devices will be used on a daily basis during the course of radiation. A complex isodose plan is requested to insure that the target area is adequately covered in to facilitate optimization of the treatment plan. A forward planning technique will also be evaluated to determine if this approach significantly improves the plan.   ________________________________   Jodelle Heward, MD, PhD

## 2015-10-04 NOTE — Progress Notes (Signed)
Weekly rad txs brain 10/10 completed, 1 month f/u appt given to see Jacob Simpson PA, takes decadron 4mg  decreased to once daily, he spoke with Jacob Beck nurse ,stated making him nauseated, she told him to decrease to 1 tab daily, in the afternoon still gets nauseated, stomach feels like pressure, appetite fair, energy level mild fatiued ,no heada ches, still dizzy at times 9:00 AM BP 137/78 (BP Location: Left Arm, Patient Position: Sitting, Cuff Size: Normal)   Pulse 64   Temp 98 F (36.7 C) (Oral)   Resp 18   Wt 169 lb 11.2 oz (77 kg)   SpO2 100%   BMI 23.02 kg/m   Wt Readings from Last 3 Encounters:  10/04/15 169 lb 11.2 oz (77 kg)  09/27/15 168 lb 3.2 oz (76.3 kg)  09/18/15 163 lb 4.8 oz (74.1 kg)

## 2015-10-04 NOTE — Progress Notes (Signed)
Department of Radiation Oncology  Phone:  [phone] Fax:        [phone]  Weekly Treatment Note    Name: Jacob Beck Date: 10/04/2015 MRN: [id number] DOB: 09-26-1950   Diagnosis:     ICD-9-CM ICD-10-CM   1. Brain metastasis (HCC) 198.3 C79.31      Current dose: 30 Gy  Current fraction: 10   MEDICATIONS: Current Outpatient Prescriptions  Medication Sig Dispense Refill  . dexamethasone (DECADRON) 4 MG tablet Take 1 tablet (4 mg total) by mouth 2 (two) times daily. 60 tablet 0  . HYDROcodone-acetaminophen (NORCO/VICODIN) 5-325 MG tablet Take 2 tablets by mouth every 4 (four) hours as needed for moderate pain. 90 tablet 0  . loperamide (IMODIUM A-D) 2 MG tablet Take 2 mg by mouth as needed for diarrhea or loose stools.    . Multiple Vitamins-Minerals (MULTIVITAMIN WITH MINERALS) tablet Take 1 tablet by mouth daily.    . pazopanib (VOTRIENT) 200 MG tablet TAKE 4 TABLETS (800MG ) ORALLY ONCE DAILY ON AN EMPTY STOMACH (AT LEAST1 HOUR BEFORE OR 2 HOURS AFTER A MEAL). SWALLOW WHOLE. DO NOT CRUSH. 120 tablet 0  . temazepam (RESTORIL) 30 MG capsule Take 1 capsule (30 mg total) by mouth at bedtime as needed for sleep. 30 capsule 0  . Wound Dressings (SONAFINE EX) Apply 1 application topically daily.    Marland Kitchen LORazepam (ATIVAN) 1 MG tablet Take 2 tablets on arrival to radiology, for MRI (Patient not taking: Reported on 09/18/2015) 5 tablet 0  . ondansetron (ZOFRAN) 4 MG tablet Take 1 tablet (4 mg total) by mouth every 8 (eight) hours as needed for nausea or vomiting. (Patient not taking: Reported on 10/04/2015) 20 tablet 0  . prochlorperazine (COMPAZINE) 10 MG tablet Take 1 tablet (10 mg total) by mouth every 6 (six) hours as needed for nausea or vomiting. (Patient not taking: Reported on 10/04/2015) 30 tablet 1   No current facility-administered medications for this encounter.      ALLERGIES: Oxycodone   LABORATORY DATA:  Lab Results  Component Value Date   WBC 4.5 08/13/2015   HGB 11.9 (L) 08/13/2015   HCT 38.1 (L) 08/13/2015   MCV 96.7 08/13/2015   PLT 249 08/13/2015   Lab Results  Component Value Date   NA 140 08/13/2015   K 4.9 08/13/2015   CL 105 10/15/2014   CO2 29 08/13/2015   Lab Results  Component Value Date   ALT 18 08/13/2015   AST 20 08/13/2015   ALKPHOS 131 08/13/2015   BILITOT 0.38 08/13/2015     NARRATIVE: Jacob Beck was seen today for weekly treatment management. The chart was checked and the patient's films were reviewed.  Weekly rad txs brain 10/10 completed, 1 month f/u appt given to see Shona Simpson PA, takes decadron 4mg  decreased to once daily, he spoke with Dr. Tenna Child nurse ,stated making him nauseated, she told him to decrease to 1 tab daily, in the afternoon still gets nauseated, stomach feels like pressure, appetite fair, energy level mild fatiued ,no heada ches, still dizzy at times 9:15 AM BP 137/78 (BP Location: Left Arm, Patient Position: Sitting, Cuff Size: Normal)   Pulse 64   Temp 98 F (36.7 C) (Oral)   Resp 18   Wt 169 lb 11.2 oz (77 kg)   SpO2 100%   BMI 23.02 kg/m   Wt Readings from Last 3 Encounters:  10/04/15 169 lb 11.2 oz (77 kg)  09/27/15 168 lb 3.2 oz (76.3 kg)  09/18/15 163 lb 4.8 oz (74.1 kg)    PHYSICAL EXAMINATION: weight is 169 lb 11.2 oz (77 kg). His oral temperature is 98 F (36.7 C). His blood pressure is 137/78 and his pulse is 64. His respiration is 18 and oxygen saturation is 100%.      No thrush present  ASSESSMENT: The patient is doing satisfactorily with treatment. He finished his final fraction today.  PLAN: The patient will return for follow-up in one month. He was given instructions regarding tapering his steroids. He currently is taking 4 mg once a day and he will come off of this medicine over the next couple of weeks. He will begin taking 2 mg per day on Monday.

## 2015-10-08 ENCOUNTER — Ambulatory Visit: Payer: Medicare HMO

## 2015-10-14 ENCOUNTER — Encounter: Payer: Self-pay | Admitting: Radiation Oncology

## 2015-10-15 ENCOUNTER — Ambulatory Visit (HOSPITAL_COMMUNITY)
Admission: RE | Admit: 2015-10-15 | Discharge: 2015-10-15 | Disposition: A | Discharge status: Home / Self Care | Payer: Medicare HMO | Source: Ambulatory Visit | Attending: Oncology | Admitting: Oncology

## 2015-10-15 ENCOUNTER — Other Ambulatory Visit: Payer: Self-pay | Admitting: Oncology

## 2015-10-15 ENCOUNTER — Encounter (HOSPITAL_COMMUNITY): Payer: Self-pay

## 2015-10-15 ENCOUNTER — Other Ambulatory Visit (HOSPITAL_BASED_OUTPATIENT_CLINIC_OR_DEPARTMENT_OTHER): Payer: Medicare HMO

## 2015-10-15 DIAGNOSIS — C649 Malignant neoplasm of unspecified kidney, except renal pelvis: Secondary | ICD-10-CM

## 2015-10-15 DIAGNOSIS — N2889 Other specified disorders of kidney and ureter: Secondary | ICD-10-CM

## 2015-10-15 DIAGNOSIS — C642 Malignant neoplasm of left kidney, except renal pelvis: Secondary | ICD-10-CM | POA: Diagnosis not present

## 2015-10-15 DIAGNOSIS — R911 Solitary pulmonary nodule: Secondary | ICD-10-CM | POA: Diagnosis not present

## 2015-10-15 DIAGNOSIS — I313 Pericardial effusion (noninflammatory): Secondary | ICD-10-CM | POA: Insufficient documentation

## 2015-10-15 DIAGNOSIS — N200 Calculus of kidney: Secondary | ICD-10-CM | POA: Insufficient documentation

## 2015-10-15 DIAGNOSIS — K769 Liver disease, unspecified: Secondary | ICD-10-CM | POA: Diagnosis not present

## 2015-10-15 DIAGNOSIS — G8929 Other chronic pain: Secondary | ICD-10-CM | POA: Diagnosis not present

## 2015-10-15 DIAGNOSIS — R109 Unspecified abdominal pain: Secondary | ICD-10-CM | POA: Diagnosis not present

## 2015-10-15 DIAGNOSIS — J432 Centrilobular emphysema: Secondary | ICD-10-CM | POA: Diagnosis not present

## 2015-10-15 LAB — COMPREHENSIVE METABOLIC PANEL
ALK PHOS: 210 U/L — AB (ref 40–150)
ALT: 31 U/L (ref 0–55)
ANION GAP: 13 meq/L — AB (ref 3–11)
AST: 23 U/L (ref 5–34)
Albumin: 2.6 g/dL — ABNORMAL LOW (ref 3.5–5.0)
BILIRUBIN TOTAL: 0.65 mg/dL (ref 0.20–1.20)
BUN: 29.4 mg/dL — ABNORMAL HIGH (ref 7.0–26.0)
CO2: 28 meq/L (ref 22–29)
Calcium: 10 mg/dL (ref 8.4–10.4)
Chloride: 98 mEq/L (ref 98–109)
Creatinine: 0.9 mg/dL (ref 0.7–1.3)
GLUCOSE: 106 mg/dL (ref 70–140)
POTASSIUM: 4.5 meq/L (ref 3.5–5.1)
SODIUM: 138 meq/L (ref 136–145)
TOTAL PROTEIN: 7.7 g/dL (ref 6.4–8.3)

## 2015-10-15 LAB — CBC WITH DIFFERENTIAL/PLATELET
BASO%: 1 % (ref 0.0–2.0)
Basophils Absolute: 0 10*3/uL (ref 0.0–0.1)
EOS%: 0.5 % (ref 0.0–7.0)
Eosinophils Absolute: 0 10*3/uL (ref 0.0–0.5)
HCT: 43.9 % (ref 38.4–49.9)
HEMOGLOBIN: 14.2 g/dL (ref 13.0–17.1)
LYMPH%: 15.6 % (ref 14.0–49.0)
MCH: 31 pg (ref 27.2–33.4)
MCHC: 32.4 g/dL (ref 32.0–36.0)
MCV: 95.4 fL (ref 79.3–98.0)
MONO#: 0.3 10*3/uL (ref 0.1–0.9)
MONO%: 8.6 % (ref 0.0–14.0)
NEUT%: 74.3 % (ref 39.0–75.0)
NEUTROS ABS: 2.4 10*3/uL (ref 1.5–6.5)
Platelets: 172 10*3/uL (ref 140–400)
RBC: 4.6 10*6/uL (ref 4.20–5.82)
RDW: 23 % — AB (ref 11.0–14.6)
WBC: 3.3 10*3/uL — AB (ref 4.0–10.3)
lymph#: 0.5 10*3/uL — ABNORMAL LOW (ref 0.9–3.3)

## 2015-10-15 MED ORDER — IOPAMIDOL (ISOVUE-300) INJECTION 61%
100.0000 mL | Freq: Once | INTRAVENOUS | Status: AC | PRN
  Administered 2015-10-15: 100 mL via INTRAVENOUS

## 2015-10-16 ENCOUNTER — Telehealth: Payer: Self-pay | Admitting: Oncology

## 2015-10-16 ENCOUNTER — Ambulatory Visit (HOSPITAL_BASED_OUTPATIENT_CLINIC_OR_DEPARTMENT_OTHER): Payer: Medicare HMO | Admitting: Oncology

## 2015-10-16 VITALS — BP 142/77 | HR 113 | Temp 98.8°F | Resp 18 | Wt 166.9 lb

## 2015-10-16 DIAGNOSIS — R110 Nausea: Secondary | ICD-10-CM | POA: Diagnosis not present

## 2015-10-16 DIAGNOSIS — M791 Myalgia: Secondary | ICD-10-CM

## 2015-10-16 DIAGNOSIS — R197 Diarrhea, unspecified: Secondary | ICD-10-CM

## 2015-10-16 DIAGNOSIS — C7931 Secondary malignant neoplasm of brain: Secondary | ICD-10-CM | POA: Diagnosis not present

## 2015-10-16 DIAGNOSIS — C642 Malignant neoplasm of left kidney, except renal pelvis: Secondary | ICD-10-CM | POA: Diagnosis not present

## 2015-10-16 MED ORDER — OXYCODONE HCL 5 MG PO TABS: 5.0000 mg | ORAL_TABLET | ORAL | 0 refills | Status: DC | PRN

## 2015-10-16 NOTE — Telephone Encounter (Signed)
Avs report and schedule given per 10/16/15 los. °

## 2015-10-16 NOTE — Progress Notes (Signed)
Hematology and Oncology Follow Up Visit  Jacob Beck [id number] 1950-04-19 65 y.o. 10/16/2015 9:19 AM   Principle Diagnosis: 65 year old gentleman with renal cell carcinoma diagnosed in April 2016. He presented with a large tumor on the left kidney measuring 15 cm and extending into the retroperitoneum causing proximal renal vein thrombosis.  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   Prior Therapy:  Attempted surgical resection on 06/01/2014 that was unsuccessful given the size of tumor. Whole brain radiation therapy completed on 10/04/2015.  Current therapy:  Votrient 800 mg daily started on 06/07/2014.    Interim History: Mr. Seitter presents today for a follow-up visit with his wife. Since the last visit, he completed whole brain radiation therapy and tolerated it reasonably well. He is reporting some fatigue and skin irritation related to that but otherwise no major complaints. He is completely off dexamethasone at this time and feels better from a dyspepsia standpoint. He did report weight loss associated with stopping dexamethasone.  He does report increased pain is predominantly generalized abdomen, pelvic which is chronic in nature but may be slightly increased. He reports hydrocodone is less effective at this time. He did reports episodic diarrhea and used Imodium to help with that.  He continues to take Votrient without any major complications at this time. He denied any nausea, vomiting or abdominal pain. He denied any hemoptysis or out of control blood pressure issues.   He does not report any fevers, chills or sweats. He does not report any chest pain, palpitation, orthopnea, leg edema. He does not report any cough, hemoptysis, hematemesis. He does not report any early satiety or abdominal distention. He does not report any skeletal pains to his back pain shoulder pain or hip pain. He does not report any lymphadenopathy or petechiae. Remainder of review of systems  unremarkable.   Medications: I have reviewed the patient's current medications.  Current Outpatient Prescriptions  Medication Sig Dispense Refill  . dexamethasone (DECADRON) 4 MG tablet Take 1 tablet (4 mg total) by mouth 2 (two) times daily. 60 tablet 0  . HYDROcodone-acetaminophen (NORCO/VICODIN) 5-325 MG tablet Take 2 tablets by mouth every 4 (four) hours as needed for moderate pain. 90 tablet 0  . loperamide (IMODIUM A-D) 2 MG tablet Take 2 mg by mouth as needed for diarrhea or loose stools.    . Multiple Vitamins-Minerals (MULTIVITAMIN WITH MINERALS) tablet Take 1 tablet by mouth daily.    . pazopanib (VOTRIENT) 200 MG tablet TAKE 4 TABLETS (800MG ) ORALLY ONCE DAILY ON AN EMPTY STOMACH (AT LEAST1 HOUR BEFORE OR 2 HOURS AFTER A MEAL). SWALLOW WHOLE. DO NOT CRUSH. 120 tablet 0  . temazepam (RESTORIL) 30 MG capsule Take 1 capsule (30 mg total) by mouth at bedtime as needed for sleep. 30 capsule 0  . Wound Dressings (SONAFINE EX) Apply 1 application topically daily.    Marland Kitchen LORazepam (ATIVAN) 1 MG tablet Take 2 tablets on arrival to radiology, for MRI (Patient not taking: Reported on 10/16/2015) 5 tablet 0  . ondansetron (ZOFRAN) 4 MG tablet Take 1 tablet (4 mg total) by mouth every 8 (eight) hours as needed for nausea or vomiting. (Patient not taking: Reported on 10/16/2015) 20 tablet 0  . prochlorperazine (COMPAZINE) 10 MG tablet Take 1 tablet (10 mg total) by mouth every 6 (six) hours as needed for nausea or vomiting. (Patient not taking: Reported on 10/16/2015) 30 tablet 1   No current facility-administered medications for this visit.      Allergies:  Allergies  Allergen Reactions  . Oxycodone Other (See Comments)    "halucinations"    Past Medical History, Surgical history, Social history, and Family History were reviewed and updated.   Physical Exam: Blood pressure (!) 142/77, pulse (!) 113, temperature 98.8 F (37.1 C), temperature source Oral, resp. rate 18, weight 166 lb 14.4 oz  (75.7 kg), SpO2 96 %. ECOG: 1 General appearance: Alert, awake gentleman without distress. Head: Normocephalic, without obvious abnormality. Scalp erythema noted. Neck: no adenopathy no thyroid masses. Lymph nodes: Cervical, supraclavicular, and axillary nodes normal. Heart:regular rate and rhythm, S1, S2 normal, no murmur, click, rub or gallop Lung:chest clear, no wheezing, rales, normal symmetric air entry Abdomin: soft, non-tender, without masses or organomegaly no shifting dullness or ascites. EXT:no erythema, induration, or nodules Neurological examination: No deficits noted.    Lab Results: Lab Results  Component Value Date   WBC 3.3 (L) 10/15/2015   HGB 14.2 10/15/2015   HCT 43.9 10/15/2015   MCV 95.4 10/15/2015   PLT 172 10/15/2015     Chemistry      Component Value Date/Time   NA 138 10/15/2015 0853   K 4.5 10/15/2015 0853   CL 105 10/15/2014 0932   CO2 28 10/15/2015 0853   BUN 29.4 (H) 10/15/2015 0853   CREATININE 0.9 10/15/2015 0853      Component Value Date/Time   CALCIUM 10.0 10/15/2015 0853   ALKPHOS 210 (H) 10/15/2015 0853   AST 23 10/15/2015 0853   ALT 31 10/15/2015 0853   BILITOT 0.65 10/15/2015 0853     EXAM: CT CHEST WITH CONTRAST  CT ABDOMEN WITHOUT AND WITH CONTRAST  TECHNIQUE: Multidetector CT imaging of the abdomen was performed without intravenous contrast. Multidetector CT imaging of the chest and abdomen was then performed during bolus administration of intravenous contrast.  CONTRAST:  18mL ISOVUE-300 IOPAMIDOL (ISOVUE-300) INJECTION 61%  COMPARISON:  Multiple exams, including 05/14/2015  FINDINGS: CT CHEST WITH CONTRAST  Cardiovascular: Mild atherosclerotic calcification of the aortic arch. Small pericardial effusion.  Mediastinum/Nodes: No pathologic thoracic adenopathy identified.  Lungs/Pleura: Centrilobular emphysema. 0.6 by 0.4 cm right upper lobe nodule, image 78/9, unchanged. New atelectasis in the  right middle lobe, images 113-123 series 9. New mild mosaic attenuation in both lower lobes, especially dependent portions. 1.6 by 1.6 cm sub solid nodule in the right lower lobe on image 92/9, stable by my measurements compared to 05/14/2015, but measuring 1.4 by 1.3 cm by my measurements on 04/30/2014. There is some scattered 2-3 mm nodules in both lungs which are stable. Mild atelectasis in the left lower lobe.  Musculoskeletal: Degenerative glenohumeral arthropathy bilaterally.  CT ABDOMEN WITHOUT AND WITH CONTRAST  Hepatobiliary: Small foci of peripheral arterial phase enhancement in segment 6 of the liver on images 43-45 series 4, nonspecific but not appreciably changed but possibly from flash filling hemangiomas are small areas of vascular shunting. Distended gallbladder, 6.2 cm diameter. CBD 8 mm, minimally increased from prior and mildly dilated.  Pancreas: Unremarkable  Spleen: Left renal mass abuts the margin of the spleen.  Adrenals/Urinary Tract: Left adrenal gland obscured and possibly invaded by the left renal mass. Left renal mass 10.9 by 9.5 cm exophytic from the left kidney upper pole pole with large enhancing elements, cystic elements, and calcified elements the, compatible with a large renal cell carcinoma. Tumor thrombus is observed in the left renal vein extending across to the level of midline but not all the way into the IVC. The extent of tumor thrombus is about the same  as before. Tumor or inflammation infiltrates the left renal parenchyma extending down into the lower pole posteriorly, similar to prior, there is considerable tumor vascularity in the perirenal space along with considerable perirenal edema/stranding.  Small right renal lesions are technically nonspecific although statistically likely to be cysts.  Urinary bladder unremarkable.  Punctate 1-2 mm right kidney lower pole nonobstructive calculus, image 93/602. Similar right mid  kidney 2 mm nonobstructive calculus, image 88/602.  Stomach/Bowel: Prominent stool throughout the colon favors constipation. Appendix normal.  Vascular/Lymphatic: Aortoiliac atherosclerotic vascular disease. Small left periaortic lymph nodes are visible without overtly pathologic periaortic adenopathy.  Other: No supplemental non-categorized findings.  Musculoskeletal: Spurring of the right sacroiliac joint.  IMPRESSION: 1. Fairly similar appearance of the 10.9 cm left renal cell carcinoma, with solid, cystic, and at calcific elements, and with components invading the parenchyma of the left kidney lower pole. Tumor thrombus in the left renal vein extends about to the midline but not all the way into the IVC. 2. Nonobstructive right nephrolithiasis. 3.  Prominent stool throughout the colon favors constipation. 4. Several small foci of arterial phase enhancement in segment 6 of the liver are probably small areas of vascular shunting or flash filling hemangiomas, but merit observation. 5. Small pericardial effusion, stable. 6. Centrilobular emphysema. 7. There is some new atelectasis in the left lower lobe and right middle lobe. 8. A sub solid nodule in the right lower lobe measures 1.6 cm in diameter, stable from 05/14/2015 by my measurements, but slightly larger than on 04/30/2014. The possibility of low-grade adenocarcinoma of lung is raised given this progression   65 year old gentleman with the following issues:  1.Renal cell carcinoma presented with a large mass measuring 15.0 x 14.1 x 9.9 cm left renal mass extending off the upper pole of the left kidney into the retroperitoneum. He has also evidence of lymphadenopathy and small pulmonary nodules. He presented with abdominal pain and 20 pound weight loss. This is a biopsy proven to be renal cell carcinoma with a biopsy done on 05/04/2014. Attempted surgical resection for debulking purposes was unsuccessful.  He is  currently on Votrient 800 mg daily and is tolerating that dose well.   CT scan on 10/15/2015 continues to show stable disease. Despite his CNS metastasis, his systemic disease appears reasonably controlled. I recommended continuing the same dose and schedule and repeat imaging studies in 3 months.    2. Brain metastasis: Detected on MRI on 09/05/2015. He completed whole brain radiation and repeat imaging studies in the future will determine any other intervention needed.  3. Diffuse body pain: I will switch his hydrocodone to oxycodone with instructions how to use it. I cautioned him about constipation as well as alteration of his mental status.  4. Nausea: It has been episodic unlikely related to Votrient. I have refilled his Compazine and Zofran with instructions how to use them.  5. Diarrhea: Appears to be manageable at this time on Votrient.  6. Liver function surveillance: No abnormalities in his liver function test on 08/13/2015.  7. Prognosis: He understands that he has an incurable malignancy and any treatment is palliative in nature at this time.  6. Follow-up: Will be in 4 weeks.    [facility], MD 9/13/20179:19 AM

## 2015-10-17 NOTE — Progress Notes (Incomplete)
°  Radiation Oncology         [phone] ________________________________  Name: Jacob Beck MRN: [id number]  Date: 10/04/2015  DOB: April 27, 1950  End of Treatment Note  Diagnosis:  Renal Cell Carcinoma with metastatic disease to the brain    ICD-9-CM ICD-10-CM   1. Brain metastasis ([place]) 198.3 C79.31        Indication for treatment:  Curative       Radiation treatment dates:   09/23/2015 to 10/04/2015  Site/dose:   The whole brain was treated to 30 Gy in 10 fractions at 3 Gy per fraction.   Beams/energy:   Isodose plan // 6X  Narrative: The patient tolerated radiation treatment relatively well.  The patient complains of mild fatigue, afternoon nausea, and dizziness at times.   Plan: The patient has completed radiation treatment. The patient will return to radiation oncology clinic for routine followup in one month. He was given instructions regarding tapering his steroids. I advised them to call or return sooner if they have any questions or concerns related to their recovery or treatment.  ------------------------------------------------  Jodelle Morrell, MD, PhD  This document serves as a record of services personally performed by Jacob Rudd, MD. It was created on his behalf by Arlyce Harman, a trained medical scribe. The creation of this record is based on the scribe's personal observations and the provider's statements to them. This document has been checked and approved by the attending provider.

## 2015-10-18 ENCOUNTER — Encounter: Payer: Self-pay | Admitting: Oncology

## 2015-10-18 NOTE — Progress Notes (Signed)
  Radiation Oncology         [phone] ________________________________  Name: Jacob Beck MRN: [id number]  Date: 10/04/2015  DOB: [birth year]  End of Treatment Note  Diagnosis:   Brain metastasis     Indication for treatment::  palliative       Radiation treatment dates:   09/23/15 - 10/04/15  Site/dose:   The patient was treated to a dose of whole brain radiation treatment to a dose of 30 Gy in 10 fractions.  Narrative: The patient tolerated radiation treatment relatively well.     Plan: The patient has completed radiation treatment. The patient will return to radiation oncology clinic for routine followup in one month. I advised the patient to call or return sooner if they have any questions or concerns related to their recovery or treatment. ________________________________  Jodelle Faso, M.D., Ph.D.

## 2015-10-21 ENCOUNTER — Other Ambulatory Visit: Payer: Self-pay | Admitting: Oncology

## 2015-10-22 ENCOUNTER — Other Ambulatory Visit: Payer: Self-pay | Admitting: Oncology

## 2015-10-22 MED ORDER — OXYCODONE HCL 5 MG PO TABS: 5.0000 mg | ORAL_TABLET | ORAL | 0 refills | Status: DC | PRN

## 2015-10-29 ENCOUNTER — Telehealth: Payer: Self-pay | Admitting: *Deleted

## 2015-10-29 ENCOUNTER — Encounter: Payer: Self-pay | Admitting: Radiation Oncology

## 2015-10-29 ENCOUNTER — Other Ambulatory Visit: Payer: Self-pay | Admitting: Oncology

## 2015-10-29 ENCOUNTER — Telehealth: Payer: Self-pay | Admitting: Radiation Oncology

## 2015-10-29 DIAGNOSIS — C649 Malignant neoplasm of unspecified kidney, except renal pelvis: Secondary | ICD-10-CM

## 2015-10-29 DIAGNOSIS — C719 Malignant neoplasm of brain, unspecified: Secondary | ICD-10-CM

## 2015-10-29 MED ORDER — TEMAZEPAM 30 MG PO CAPS: 30.0000 mg | ORAL_CAPSULE | Freq: Every evening | ORAL | 0 refills | Status: DC | PRN

## 2015-10-29 MED ORDER — DEXAMETHASONE 4 MG PO TABS: 4.0000 mg | ORAL_TABLET | Freq: Two times a day (BID) | ORAL | 0 refills | Status: DC

## 2015-10-29 NOTE — Telephone Encounter (Signed)
Returned, call to patient e-mail, he is getting increasing dizzy ness every day, stomach troubles too, head gets hot and he uses ice pack, prn, "  I'm afraid to drive any more," has been off decadron, is taking votrient and gas ex, , needs to change his Oct appt to a morning time so his wife can drive him, I will talk with out PA,Alison and call the patient back with advice  1:10 PM  1:10 PM

## 2015-10-29 NOTE — Telephone Encounter (Signed)
I called the patient to see what symptoms he has been having due to a call this morning. He has been having dizziness since his treatment finished on 10/04/15. He stopped his Dexamethasone about 1 week ago after a taper. He reports he has dizziness with movement and stopped going for walks last week as a result. He also had nausea during the course of taking steroids and is concerned about taking this again. I explained my concerns about edema due to his metastatic disease being the most likely cause of his dizziness. He denies any cardiovascular complaints, bleeding, or difficulty with hydration to otherwise explain his symptoms. I have discussed his case with Dr. Tammi Klippel as well and we feel that dexamethasone 4 mg BID would be the best course at this time. I have called in a new prescription for this and will follow up with him on Friday of this week. He has been given precautions for PPI use with steroids to protect his stomach, and has been given precautions for being evaluated in the ED if his symptoms acutely change.

## 2015-11-01 ENCOUNTER — Encounter: Payer: Self-pay | Admitting: Radiation Oncology

## 2015-11-01 ENCOUNTER — Telehealth: Payer: Self-pay | Admitting: Radiation Oncology

## 2015-11-01 ENCOUNTER — Other Ambulatory Visit: Payer: Self-pay | Admitting: Oncology

## 2015-11-01 MED ORDER — OXYCODONE HCL 5 MG PO TABS: 5.0000 mg | ORAL_TABLET | ORAL | 0 refills | Status: DC | PRN

## 2015-11-01 NOTE — Telephone Encounter (Signed)
Confirmed that he is taking PPI. Advised him to check his mouth for thrush with an explanation of how it presents with instructions to call if this occurs. He stated understanding.

## 2015-11-01 NOTE — Telephone Encounter (Signed)
I called the patient to check on his symptoms since we started steroids Tuesday. He has noticed less dizziness and is taking his Dexamethasone 4 mg BID. I encouraged him to continue PPI as well and I will contact him next Tuesday to come up with a plan to slowly taper his steroids.

## 2015-11-05 ENCOUNTER — Telehealth: Payer: Self-pay | Admitting: *Deleted

## 2015-11-05 ENCOUNTER — Encounter: Payer: Self-pay | Admitting: Radiation Oncology

## 2015-11-05 NOTE — Telephone Encounter (Signed)
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

## 2015-11-05 NOTE — Telephone Encounter (Signed)
Called patient after receiving in basket of patient stating," still having some dizzy ness but a lot less since restarting steroids", spoke with patient  After getting message from the patient, and he stated since he restarted the steroids  Very little dizzy ness now, still has some stomach issues but that is a lot better also", will keep 11/11/15 follow up appt with Shona Simpson, PA , thanked me for calling and checking on him 12:30 PM

## 2015-11-06 ENCOUNTER — Telehealth: Payer: Self-pay | Admitting: *Deleted

## 2015-11-06 NOTE — Telephone Encounter (Signed)
Will you let him know I'm pleased that he's doing better, I think we could start tapering his steroids. I would suggest dropping the dexamethasone to 2mg  BID for the next 7 days. Let's check with him next week and see how he's doing. If he's doing ok then, I would drop him to 2mg  once a day for 7 days, then 2 mg every other day for 1 week, then stop. Thanks, Bryson Ha

## 2015-11-06 NOTE — Telephone Encounter (Signed)
Called and spoke with Jacob Beck to instruct him that starting tomorrow he needs to take Decadron 2 mg po BID for 7 days, then he will be reassessed on his next visit date on 11/11/15, in radiation oncology to determine efficacy of change in dosage.Today he has already taken his originally prescribed 4mg  dose this am and will take 4 mg po in the pm today.

## 2015-11-07 ENCOUNTER — Other Ambulatory Visit: Payer: Self-pay | Admitting: Hematology and Oncology

## 2015-11-08 ENCOUNTER — Other Ambulatory Visit: Payer: Self-pay | Admitting: *Deleted

## 2015-11-08 ENCOUNTER — Telehealth: Payer: Self-pay | Admitting: *Deleted

## 2015-11-08 ENCOUNTER — Telehealth: Payer: Self-pay

## 2015-11-08 MED ORDER — OXYCODONE HCL 5 MG PO TABS: 5.0000 mg | ORAL_TABLET | ORAL | 0 refills | Status: DC | PRN

## 2015-11-08 NOTE — Telephone Encounter (Signed)
Received call from patients wife in regards to needing a Refill on his Oxycodone.

## 2015-11-08 NOTE — Telephone Encounter (Signed)
[facility] called requesting diagnosis code to go with rx for oxycodone 5 mg. LVM on their phone line with dx code renal cell ca C64.2

## 2015-11-08 NOTE — Telephone Encounter (Signed)
This RN spoke with patient and informed him that his prescription was ready for pick up. Patient verbalized understanding. 

## 2015-11-11 ENCOUNTER — Ambulatory Visit
Admission: RE | Admit: 2015-11-11 | Discharge: 2015-11-11 | Disposition: A | Discharge status: Home / Self Care | Payer: Medicare HMO | Source: Ambulatory Visit | Attending: Radiation Oncology | Admitting: Radiation Oncology

## 2015-11-11 ENCOUNTER — Encounter: Payer: Self-pay | Admitting: Radiation Oncology

## 2015-11-11 DIAGNOSIS — C642 Malignant neoplasm of left kidney, except renal pelvis: Secondary | ICD-10-CM | POA: Diagnosis not present

## 2015-11-11 DIAGNOSIS — C7931 Secondary malignant neoplasm of brain: Secondary | ICD-10-CM | POA: Diagnosis not present

## 2015-11-11 DIAGNOSIS — Z885 Allergy status to narcotic agent status: Secondary | ICD-10-CM | POA: Diagnosis not present

## 2015-11-11 DIAGNOSIS — Z79899 Other long term (current) drug therapy: Secondary | ICD-10-CM | POA: Insufficient documentation

## 2015-11-11 DIAGNOSIS — Z923 Personal history of irradiation: Secondary | ICD-10-CM | POA: Diagnosis not present

## 2015-11-11 NOTE — Progress Notes (Addendum)
Mr. Cannoy here for reassessment s/p XRT for whole brain.  Note steady gait when ambulating.  He admits to intermittent dizziness, "a little bit", when walking.  No nausea and vomiting.  Denies any blurred nor double vision.  No changes in fine motor movement.  Now on Tapered dose of Decadron.  Mouth without any signs of thrush and oral mucosa pink and moist.     BP 122/76   Pulse 82   Temp 98.2 F (36.8 C)   Ht 6' (1.829 m)   Wt 164 lb (74.4 kg)   BMI 22.24 kg/m    Wt Readings from Last 3 Encounters:  11/11/15 164 lb (74.4 kg)  10/16/15 166 lb 14.4 oz (75.7 kg)  10/04/15 169 lb 11.2 oz (77 kg)

## 2015-11-11 NOTE — Progress Notes (Signed)
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  Narrative:  The patient returns today for routine follow-up. The patient tolerated radiotherapy well, but the week after his treatment tapered off steroids and started having dizziness and episodes of nausea. He was started back on dexamethasone, and is now tapering. He is currently takin Dexamethasone 2mg  BID, and tomorrow will go to 2mg  daily for one week, then 2 mg every other day for a week and stop.                            On review of systems, the patient states he is feeling much better than prior to restarting steroids. He denies any upset stomach but had forgotten to restart PPI therapy. He will start prilosec back today. He denies any headaches, visual or auditory disturbances. He notes dry scaly skin on his scalp. No other complaints are noted.  ALLERGIES:  is allergic to oxycodone.  Meds: Current Outpatient Prescriptions  Medication Sig Dispense Refill  . dexamethasone (DECADRON) 4 MG tablet Take 1 tablet (4 mg total) by mouth 2 (two) times daily. 60 tablet 0  . loperamide (IMODIUM A-D) 2 MG tablet Take 2 mg by mouth as needed for diarrhea or loose stools.    Marland Kitchen LORazepam (ATIVAN) 1 MG tablet Take 2 tablets on arrival to radiology, for MRI 5 tablet 0  . Multiple Vitamins-Minerals (MULTIVITAMIN WITH MINERALS) tablet Take 1 tablet by mouth daily.    Marland Kitchen oxyCODONE (OXY IR/ROXICODONE) 5 MG immediate release tablet Take 1 tablet (5 mg total) by mouth every 4 (four) hours as needed for severe pain. 90 tablet 0  . pazopanib (VOTRIENT) 200  MG tablet TAKE 4 TABLETS (800MG ) ORALLY ONCE DAILY ON AN EMPTY STOMACH (AT LEAST1 HOUR BEFORE OR 2 HOURS AFTER A MEAL). SWALLOW WHOLE. DO NOT CRUSH. 120 tablet 0  . temazepam (RESTORIL) 30 MG capsule Take 1 capsule (30 mg total) by mouth at bedtime as needed for sleep. 30 capsule 0  . ondansetron (ZOFRAN) 4 MG tablet Take 1 tablet (4 mg total) by mouth every 8 (eight) hours as needed for nausea or vomiting. (Patient not taking: Reported on 11/11/2015) 20 tablet 0  . prochlorperazine (COMPAZINE) 10 MG tablet Take 1 tablet (10 mg total) by mouth every 6 (six) hours as needed for nausea or vomiting. (Patient not taking: Reported on 11/11/2015) 30 tablet 1   No current facility-administered medications for this encounter.     Physical Findings:  height is 6' (1.829 m) and weight is 164 lb (74.4 kg). His temperature is 98.2 F (36.8 C). His blood pressure is 122/76 and his pulse is 82.  In general this is a well appearing Caucasian male in no acute distress. He's alert and oriented x4 and appropriate throughout the examination. Cardiopulmonary assessment is negative for acute distress and he exhibits normal effort. His scalp is dry with several scabs and areas of excoriation without cellulitic change or desquamation.  Lab Findings: Lab Results  Component Value Date  WBC 3.3 (L) 10/15/2015   HGB 14.2 10/15/2015   HCT 43.9 10/15/2015   MCV 95.4 10/15/2015   PLT 172 10/15/2015     Radiographic Findings: Ct Abdomen Pelvis W Wo Contrast  Result Date: 10/15/2015 CLINICAL DATA:  Nausea and mid abdominal pressure for 3 weeks. Metastatic renal cancer originally diagnosed in March 2016, oral chemotherapy in progress, radiation therapy to the brain for intracranial metastatic disease. EXAM: CT CHEST WITH CONTRAST CT ABDOMEN WITHOUT AND WITH CONTRAST TECHNIQUE: Multidetector CT imaging of the abdomen was performed without intravenous contrast. Multidetector CT imaging of the chest and abdomen was then  performed during bolus administration of intravenous contrast. CONTRAST:  151mL ISOVUE-300 IOPAMIDOL (ISOVUE-300) INJECTION 61% COMPARISON:  Multiple exams, including 05/14/2015 FINDINGS: CT CHEST WITH CONTRAST Cardiovascular: Mild atherosclerotic calcification of the aortic arch. Small pericardial effusion. Mediastinum/Nodes: No pathologic thoracic adenopathy identified. Lungs/Pleura: Centrilobular emphysema. 0.6 by 0.4 cm right upper lobe nodule, image 78/9, unchanged. New atelectasis in the right middle lobe, images 113-123 series 9. New mild mosaic attenuation in both lower lobes, especially dependent portions. 1.6 by 1.6 cm sub solid nodule in the right lower lobe on image 92/9, stable by my measurements compared to 05/14/2015, but measuring 1.4 by 1.3 cm by my measurements on 04/30/2014. There is some scattered 2-3 mm nodules in both lungs which are stable. Mild atelectasis in the left lower lobe. Musculoskeletal: Degenerative glenohumeral arthropathy bilaterally. CT ABDOMEN WITHOUT AND WITH CONTRAST Hepatobiliary: Small foci of peripheral arterial phase enhancement in segment 6 of the liver on images 43-45 series 4, nonspecific but not appreciably changed but possibly from flash filling hemangiomas are small areas of vascular shunting. Distended gallbladder, 6.2 cm diameter. CBD 8 mm, minimally increased from prior and mildly dilated. Pancreas: Unremarkable Spleen: Left renal mass abuts the margin of the spleen. Adrenals/Urinary Tract: Left adrenal gland obscured and possibly invaded by the left renal mass. Left renal mass 10.9 by 9.5 cm exophytic from the left kidney upper pole pole with large enhancing elements, cystic elements, and calcified elements the, compatible with a large renal cell carcinoma. Tumor thrombus is observed in the left renal vein extending across to the level of midline but not all the way into the IVC. The extent of tumor thrombus is about the same as before. Tumor or inflammation  infiltrates the left renal parenchyma extending down into the lower pole posteriorly, similar to prior, there is considerable tumor vascularity in the perirenal space along with considerable perirenal edema/stranding. Small right renal lesions are technically nonspecific although statistically likely to be cysts. Urinary bladder unremarkable. Punctate 1-2 mm right kidney lower pole nonobstructive calculus, image 93/602. Similar right mid kidney 2 mm nonobstructive calculus, image 88/602. Stomach/Bowel: Prominent stool throughout the colon favors constipation. Appendix normal. Vascular/Lymphatic: Aortoiliac atherosclerotic vascular disease. Small left periaortic lymph nodes are visible without overtly pathologic periaortic adenopathy. Other: No supplemental non-categorized findings. Musculoskeletal: Spurring of the right sacroiliac joint. IMPRESSION: 1. Fairly similar appearance of the 10.9 cm left renal cell carcinoma, with solid, cystic, and at calcific elements, and with components invading the parenchyma of the left kidney lower pole. Tumor thrombus in the left renal vein extends about to the midline but not all the way into the IVC. 2. Nonobstructive right nephrolithiasis. 3.  Prominent stool throughout the colon favors constipation. 4. Several small foci of arterial phase enhancement in segment 6 of the liver are probably small areas of vascular shunting or flash filling hemangiomas, but merit observation. 5. Small pericardial effusion, stable.  6. Centrilobular emphysema. 7. There is some new atelectasis in the left lower lobe and right middle lobe. 8. A sub solid nodule in the right lower lobe measures 1.6 cm in diameter, stable from 05/14/2015 by my measurements, but slightly larger than on 04/30/2014. The possibility of low-grade adenocarcinoma of lung is raised given this progression. Electronically Signed   By: Van Clines M.D.   On: 10/15/2015 11:24   Ct Chest W Contrast  Result Date:  10/15/2015 CLINICAL DATA:  Nausea and mid abdominal pressure for 3 weeks. Metastatic renal cancer originally diagnosed in March 2016, oral chemotherapy in progress, radiation therapy to the brain for intracranial metastatic disease. EXAM: CT CHEST WITH CONTRAST CT ABDOMEN WITHOUT AND WITH CONTRAST TECHNIQUE: Multidetector CT imaging of the abdomen was performed without intravenous contrast. Multidetector CT imaging of the chest and abdomen was then performed during bolus administration of intravenous contrast. CONTRAST:  139mL ISOVUE-300 IOPAMIDOL (ISOVUE-300) INJECTION 61% COMPARISON:  Multiple exams, including 05/14/2015 FINDINGS: CT CHEST WITH CONTRAST Cardiovascular: Mild atherosclerotic calcification of the aortic arch. Small pericardial effusion. Mediastinum/Nodes: No pathologic thoracic adenopathy identified. Lungs/Pleura: Centrilobular emphysema. 0.6 by 0.4 cm right upper lobe nodule, image 78/9, unchanged. New atelectasis in the right middle lobe, images 113-123 series 9. New mild mosaic attenuation in both lower lobes, especially dependent portions. 1.6 by 1.6 cm sub solid nodule in the right lower lobe on image 92/9, stable by my measurements compared to 05/14/2015, but measuring 1.4 by 1.3 cm by my measurements on 04/30/2014. There is some scattered 2-3 mm nodules in both lungs which are stable. Mild atelectasis in the left lower lobe. Musculoskeletal: Degenerative glenohumeral arthropathy bilaterally. CT ABDOMEN WITHOUT AND WITH CONTRAST Hepatobiliary: Small foci of peripheral arterial phase enhancement in segment 6 of the liver on images 43-45 series 4, nonspecific but not appreciably changed but possibly from flash filling hemangiomas are small areas of vascular shunting. Distended gallbladder, 6.2 cm diameter. CBD 8 mm, minimally increased from prior and mildly dilated. Pancreas: Unremarkable Spleen: Left renal mass abuts the margin of the spleen. Adrenals/Urinary Tract: Left adrenal gland obscured  and possibly invaded by the left renal mass. Left renal mass 10.9 by 9.5 cm exophytic from the left kidney upper pole pole with large enhancing elements, cystic elements, and calcified elements the, compatible with a large renal cell carcinoma. Tumor thrombus is observed in the left renal vein extending across to the level of midline but not all the way into the IVC. The extent of tumor thrombus is about the same as before. Tumor or inflammation infiltrates the left renal parenchyma extending down into the lower pole posteriorly, similar to prior, there is considerable tumor vascularity in the perirenal space along with considerable perirenal edema/stranding. Small right renal lesions are technically nonspecific although statistically likely to be cysts. Urinary bladder unremarkable. Punctate 1-2 mm right kidney lower pole nonobstructive calculus, image 93/602. Similar right mid kidney 2 mm nonobstructive calculus, image 88/602. Stomach/Bowel: Prominent stool throughout the colon favors constipation. Appendix normal. Vascular/Lymphatic: Aortoiliac atherosclerotic vascular disease. Small left periaortic lymph nodes are visible without overtly pathologic periaortic adenopathy. Other: No supplemental non-categorized findings. Musculoskeletal: Spurring of the right sacroiliac joint. IMPRESSION: 1. Fairly similar appearance of the 10.9 cm left renal cell carcinoma, with solid, cystic, and at calcific elements, and with components invading the parenchyma of the left kidney lower pole. Tumor thrombus in the left renal vein extends about to the midline but not all the way into the IVC. 2. Nonobstructive right nephrolithiasis. 3.  Prominent stool throughout the colon favors constipation. 4. Several small foci of arterial phase enhancement in segment 6 of the liver are probably small areas of vascular shunting or flash filling hemangiomas, but merit observation. 5. Small pericardial effusion, stable. 6. Centrilobular  emphysema. 7. There is some new atelectasis in the left lower lobe and right middle lobe. 8. A sub solid nodule in the right lower lobe measures 1.6 cm in diameter, stable from 05/14/2015 by my measurements, but slightly larger than on 04/30/2014. The possibility of low-grade adenocarcinoma of lung is raised given this progression. Electronically Signed   By: Van Clines M.D.   On: 10/15/2015 11:24    Impression/Plan: 1. Metastatic renal cell carcinoma to brain. The patient has follow up with Dr. Alen Blew later this week to discuss his systemic treatments. The patient is doing much better with symptoms since restartging dexamethasone and appears to be doing well with his taper. He will complete his taper in about 2 weeks, and we will plan his first baseline MRI in about 3 months. He states agreement and understanding. He will keep [place] informed of questions or concerns prior to that visit. 2. Prophylaxis. I encouraged the patient to resume his PPI therapy and he assures me he will restart prilosec to protect his stomach.      Carola Rhine, PAC

## 2015-11-13 ENCOUNTER — Other Ambulatory Visit (HOSPITAL_BASED_OUTPATIENT_CLINIC_OR_DEPARTMENT_OTHER): Payer: Medicare HMO

## 2015-11-13 ENCOUNTER — Telehealth: Payer: Self-pay | Admitting: Oncology

## 2015-11-13 ENCOUNTER — Ambulatory Visit (HOSPITAL_BASED_OUTPATIENT_CLINIC_OR_DEPARTMENT_OTHER): Payer: Medicare HMO | Admitting: Oncology

## 2015-11-13 VITALS — BP 133/80 | HR 94 | Temp 98.1°F | Resp 18 | Wt 162.5 lb

## 2015-11-13 DIAGNOSIS — C7931 Secondary malignant neoplasm of brain: Secondary | ICD-10-CM | POA: Diagnosis not present

## 2015-11-13 DIAGNOSIS — R197 Diarrhea, unspecified: Secondary | ICD-10-CM

## 2015-11-13 DIAGNOSIS — C649 Malignant neoplasm of unspecified kidney, except renal pelvis: Secondary | ICD-10-CM

## 2015-11-13 DIAGNOSIS — M791 Myalgia: Secondary | ICD-10-CM

## 2015-11-13 DIAGNOSIS — C642 Malignant neoplasm of left kidney, except renal pelvis: Secondary | ICD-10-CM

## 2015-11-13 DIAGNOSIS — R110 Nausea: Secondary | ICD-10-CM | POA: Diagnosis not present

## 2015-11-13 LAB — CBC WITH DIFFERENTIAL/PLATELET
BASO%: 0.3 % (ref 0.0–2.0)
BASOS ABS: 0 10*3/uL (ref 0.0–0.1)
EOS%: 0.6 % (ref 0.0–7.0)
Eosinophils Absolute: 0 10*3/uL (ref 0.0–0.5)
HEMATOCRIT: 41.7 % (ref 38.4–49.9)
HGB: 13 g/dL (ref 13.0–17.1)
LYMPH%: 26.2 % (ref 14.0–49.0)
MCH: 30.6 pg (ref 27.2–33.4)
MCHC: 31.2 g/dL — AB (ref 32.0–36.0)
MCV: 98.1 fL — ABNORMAL HIGH (ref 79.3–98.0)
MONO#: 0.4 10*3/uL (ref 0.1–0.9)
MONO%: 6.2 % (ref 0.0–14.0)
NEUT#: 4.6 10*3/uL (ref 1.5–6.5)
NEUT%: 66.7 % (ref 39.0–75.0)
Platelets: 249 10*3/uL (ref 140–400)
RBC: 4.25 10*6/uL (ref 4.20–5.82)
RDW: 21.6 % — ABNORMAL HIGH (ref 11.0–14.6)
WBC: 7 10*3/uL (ref 4.0–10.3)
lymph#: 1.8 10*3/uL (ref 0.9–3.3)

## 2015-11-13 LAB — COMPREHENSIVE METABOLIC PANEL
ALT: 20 U/L (ref 0–55)
AST: 20 U/L (ref 5–34)
Albumin: 2.9 g/dL — ABNORMAL LOW (ref 3.5–5.0)
Alkaline Phosphatase: 86 U/L (ref 40–150)
Anion Gap: 11 mEq/L (ref 3–11)
BUN: 25.2 mg/dL (ref 7.0–26.0)
CALCIUM: 9.5 mg/dL (ref 8.4–10.4)
CHLORIDE: 104 meq/L (ref 98–109)
CO2: 22 meq/L (ref 22–29)
Creatinine: 0.9 mg/dL (ref 0.7–1.3)
EGFR: >90 mL/min/{1.73_m2} (ref >90)
Glucose: 112 mg/dl (ref 70–140)
POTASSIUM: 4.8 meq/L (ref 3.5–5.1)
Sodium: 138 mEq/L (ref 136–145)
Total Bilirubin: 0.63 mg/dL (ref 0.20–1.20)
Total Protein: 7.8 g/dL (ref 6.4–8.3)

## 2015-11-13 NOTE — Progress Notes (Signed)
Hematology and Oncology Follow Up Visit  Jacob Beck [id number] 20-Aug-1950 65 y.o. 11/13/2015 9:30 AM   Principle Diagnosis: 65 year old gentleman with renal cell carcinoma diagnosed in April 2016. He presented with a large tumor on the left kidney measuring 15 cm and extending into the retroperitoneum causing proximal renal vein thrombosis.  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   Prior Therapy:  Attempted surgical resection on 06/01/2014 that was unsuccessful given the size of tumor. Whole brain radiation therapy completed on 10/04/2015.  Current therapy:   Votrient 800 mg daily started on 06/07/2014.    Interim History: Jacob Beck presents today for a follow-up visit with his wife. Since the last visit, he reports no major changes in his health. He is currently on steroid taper which has affected his overall appetite. He lost 4 pounds and he is a little bit more fatigued and has reported more diarrhea. His activity levels have decreased some and he is spending more time in a chair rather than being active.  He continues to take Votrient without any major complications at this time. He denied any nausea, vomiting or abdominal pain. He denied any hemoptysis or out of control blood pressure issues. He is reporting more loose habits and also change in the taste of certain foods.   He does not report any fevers, chills or sweats. He does not report any chest pain, palpitation, orthopnea, leg edema. He does not report any cough, hemoptysis, hematemesis. He does not report any early satiety or abdominal distention. He does not report any skeletal pains to his back pain shoulder pain or hip pain. He does not report any lymphadenopathy or petechiae. Remainder of review of systems unremarkable.   Medications: I have reviewed the patient's current medications.  Current Outpatient Prescriptions  Medication Sig Dispense Refill  . dexamethasone (DECADRON) 4 MG tablet Take 1 tablet (4 mg  total) by mouth 2 (two) times daily. 60 tablet 0  . HYDROcodone-acetaminophen (NORCO/VICODIN) 5-325 MG tablet     . loperamide (IMODIUM A-D) 2 MG tablet Take 2 mg by mouth as needed for diarrhea or loose stools.    . Multiple Vitamins-Minerals (MULTIVITAMIN WITH MINERALS) tablet Take 1 tablet by mouth daily.    . ondansetron (ZOFRAN) 4 MG tablet Take 1 tablet (4 mg total) by mouth every 8 (eight) hours as needed for nausea or vomiting. 20 tablet 0  . oxyCODONE (OXY IR/ROXICODONE) 5 MG immediate release tablet Take 1 tablet (5 mg total) by mouth every 4 (four) hours as needed for severe pain. 90 tablet 0  . pazopanib (VOTRIENT) 200 MG tablet TAKE 4 TABLETS (800MG ) ORALLY ONCE DAILY ON AN EMPTY STOMACH (AT LEAST1 HOUR BEFORE OR 2 HOURS AFTER A MEAL). SWALLOW WHOLE. DO NOT CRUSH. 120 tablet 0  . prochlorperazine (COMPAZINE) 10 MG tablet Take 1 tablet (10 mg total) by mouth every 6 (six) hours as needed for nausea or vomiting. 30 tablet 1  . temazepam (RESTORIL) 30 MG capsule Take 1 capsule (30 mg total) by mouth at bedtime as needed for sleep. 30 capsule 0   No current facility-administered medications for this visit.      Allergies:  Allergies  Allergen Reactions  . Oxycodone Other (See Comments)    "halucinations"    Past Medical History, Surgical history, Social history, and Family History were reviewed and updated.   Physical Exam: Blood pressure 133/80, pulse 94, temperature 98.1 F (36.7 C), temperature source Oral, resp. rate 18, weight 162 lb 8 oz (  73.7 kg), SpO2 97 %. ECOG: 1 General appearance: Chronically ill-appearing gentleman without distress. Head: Normocephalic, without obvious abnormality. No oral ulcers or lesions. Neck: no adenopathy no thyroid masses. Lymph nodes: Cervical, supraclavicular, and axillary nodes normal. Heart:regular rate and rhythm, S1, S2 normal, no murmur, click, rub or gallop Lung:chest clear, no wheezing, rales, normal symmetric air entry Abdomin:  soft, non-tender, without masses or organomegaly no rebound or guarding. EXT:no erythema, induration, or nodules Neurological examination: No deficits noted.    Lab Results: Lab Results  Component Value Date   WBC 7.0 11/13/2015   HGB 13.0 11/13/2015   HCT 41.7 11/13/2015   MCV 98.1 (H) 11/13/2015   PLT 249 11/13/2015     Chemistry      Component Value Date/Time   NA 138 10/15/2015 0853   K 4.5 10/15/2015 0853   CL 105 10/15/2014 0932   CO2 28 10/15/2015 0853   BUN 29.4 (H) 10/15/2015 0853   CREATININE 0.9 10/15/2015 0853      Component Value Date/Time   CALCIUM 10.0 10/15/2015 0853   ALKPHOS 210 (H) 10/15/2015 0853   AST 23 10/15/2015 0853   ALT 31 10/15/2015 0853   BILITOT 0.65 10/15/2015 1021      65 year old gentleman with the following issues:  1.Renal cell carcinoma presented with a large mass measuring 15.0 x 14.1 x 9.9 cm left renal mass extending off the upper pole of the left kidney into the retroperitoneum. He has also evidence of lymphadenopathy and small pulmonary nodules. He presented with abdominal pain and 20 pound weight loss. This is a biopsy proven to be renal cell carcinoma with a biopsy done on 05/04/2014. Attempted surgical resection for debulking purposes was unsuccessful.  He is currently on Votrient 800 mg daily.   CT scan on 10/15/2015 continues to show stable disease. Despite his CNS metastasis, his systemic disease appears reasonably controlled.   He is experiencing more side effects associated with this medication and I have recommended reducing the dose to 600 mg daily to improve his quality of life. The symptoms including diarrhea, fatigue and poor by mouth intake. Some of the symptoms are related to his CNS metastasis and steroid taper but certainly reducing the dose of Votrient might improve his quality of life. He is agreeable to try this for the next 4 weeks.    2. Brain metastasis: Detected on MRI on 09/05/2015. He completed whole brain  radiation and repeat imaging studies in the future will determine any other intervention needed.  3. Diffuse body pain: She is currently on oxycodone which helped his body pain.  4. Nausea: Managed at this time with Compazine.  5. Diarrhea: Appears to be manageable at this time on Votrient. Reducing the dose of Votrient will help as well.  6. Liver function surveillance: No abnormalities in his liver function test on 10/15/2015.  7. Prognosis: He understands that he has an incurable malignancy and any treatment is palliative in nature at this time.  6. Follow-up: Will be in 4 weeks.    [id number], MD 10/11/20179:30 AM

## 2015-11-13 NOTE — Telephone Encounter (Signed)
Avs report and appointment schedule given to patient per 11/13/15 los. °

## 2015-11-14 ENCOUNTER — Other Ambulatory Visit: Payer: Self-pay | Admitting: *Deleted

## 2015-11-14 DIAGNOSIS — N2889 Other specified disorders of kidney and ureter: Secondary | ICD-10-CM

## 2015-11-14 MED ORDER — PAZOPANIB HCL 200 MG PO TABS: 800.0000 mg | ORAL_TABLET | Freq: Every day | ORAL | 0 refills | Status: DC

## 2015-11-26 ENCOUNTER — Telehealth: Payer: Self-pay | Admitting: *Deleted

## 2015-11-26 ENCOUNTER — Other Ambulatory Visit: Payer: Self-pay | Admitting: Oncology

## 2015-11-26 ENCOUNTER — Other Ambulatory Visit: Payer: Self-pay | Admitting: Radiation Oncology

## 2015-11-26 ENCOUNTER — Encounter: Payer: Self-pay | Admitting: Radiation Oncology

## 2015-11-26 ENCOUNTER — Encounter: Payer: Self-pay | Admitting: *Deleted

## 2015-11-26 DIAGNOSIS — C649 Malignant neoplasm of unspecified kidney, except renal pelvis: Secondary | ICD-10-CM

## 2015-11-26 DIAGNOSIS — C7931 Secondary malignant neoplasm of brain: Secondary | ICD-10-CM

## 2015-11-26 MED ORDER — DEXAMETHASONE 4 MG PO TABS: 2.0000 mg | ORAL_TABLET | Freq: Three times a day (TID) | ORAL | 0 refills | Status: DC

## 2015-11-26 NOTE — Telephone Encounter (Signed)
Called  And left vm to call me,  Per Dr. Lisbeth Renshaw and Shona Simpson, out PA, patient to restart decadron 2mg  tablet 3x day and to recheck on patient next week

## 2015-11-26 NOTE — Patient Instructions (Signed)
To restart decadron 2mg  tablet  3x day and to recheck patient status next week per Alean Rinne after discussing with Dr. Lisbeth Renshaw, left vm to have wife call me 12:27 PM

## 2015-11-26 NOTE — Telephone Encounter (Signed)
Called patient and spoke with the wife, acknowledging her in basket/ e-mail request, Jacob Beck, our PA is going to discuss with Dr. Lisbeth Renshaw   His  Shaking of his hands and mouth and sleepin 12 hours at atime, may get earlier appt for his MRI, but will call her back later today with updated status, wife thanked this Rn for calling.now 9:38 AM

## 2015-11-26 NOTE — Telephone Encounter (Signed)
Called [facility] spoke with Tyler,pharmacist, RX dexamethasone/decadron 2mg  tablet, take 1 tablet 3x day ,dispense 90 tabs,no refill per Shona Simpson, PA, verbal read back by the pharmacist,Tyler, 1:32 PM

## 2015-11-26 NOTE — Telephone Encounter (Signed)
Patient Calls   Doreen Beam, RN routed conversation to You 1 hour ago (12:27 PM)    Doreen Beam, RN 1 hour ago (12:26 PM)    To restart decadron 2mg  tablet  3x day and to recheck patient status next week per Alean Rinne after discussing with Dr. Lisbeth Renshaw, left vm to have wife call me 12:27 PM    Documentation

## 2015-11-26 NOTE — Telephone Encounter (Signed)
Voicemail from [place]: "I'm calling for Jacob Beck who is having issues with shaking and I need to know what to do about this" Called to learn "his hands shake, his mouth shakes when he tries to eat.  He's not eating except for small bites.  Says nothing taste good and he's lost weight.  He drinks okay.  The shaking started a few days ago.  He's been on the Votrient over a year.  Please call [phone]."

## 2015-11-26 NOTE — Telephone Encounter (Signed)
Forwarding this message onto Thayer Headings, [place] for Dr. Lisbeth Renshaw.

## 2015-11-28 ENCOUNTER — Other Ambulatory Visit: Payer: Self-pay | Admitting: Oncology

## 2015-11-28 ENCOUNTER — Other Ambulatory Visit: Payer: Self-pay | Admitting: *Deleted

## 2015-11-28 MED ORDER — TEMAZEPAM 30 MG PO CAPS: 30.0000 mg | ORAL_CAPSULE | Freq: Every evening | ORAL | 0 refills | Status: DC | PRN

## 2015-11-29 ENCOUNTER — Encounter: Payer: Self-pay | Admitting: *Deleted

## 2015-11-29 ENCOUNTER — Telehealth: Payer: Self-pay | Admitting: *Deleted

## 2015-11-29 ENCOUNTER — Other Ambulatory Visit: Payer: Self-pay | Admitting: Radiation Oncology

## 2015-11-29 DIAGNOSIS — C7931 Secondary malignant neoplasm of brain: Secondary | ICD-10-CM

## 2015-11-29 DIAGNOSIS — C649 Malignant neoplasm of unspecified kidney, except renal pelvis: Secondary | ICD-10-CM

## 2015-11-29 DIAGNOSIS — R251 Tremor, unspecified: Secondary | ICD-10-CM

## 2015-11-29 NOTE — Telephone Encounter (Signed)
Wife called and asked if radiation causing  his hands and mouth shaking, he restarted his deacdron tid as recommended , asking for call back, his handas and mouth hasn't stopped, infomred [place], [place], she will put in referral to Dr. Delice Lesch to see patient if possible next week 11:03 AM

## 2015-11-29 NOTE — Telephone Encounter (Addendum)
At ~4pm this RN called Mr. Balsamo to enquire if he had received a call from Dr. Santiago Glad Aquino's office.  He stated that he had not been contacted at this time.  Called their office and spoke with Dawn, RN who I had an earlier encounter with at 11:30am, prior to faxing all required information at 11:57am with confirmation of Successful Tx Notice cover sheet which was returned to Rad/Onc at 12:06pm.  Called and spoke with Dawn at Dr. Amparo Bristol office and she stated that the information was forwarded to at Dr. Carles Collet for review since Mr. Phelan presentation, with tremors, was his specialty,as stated by Arrie Aran, RN. Once review complete Mr. Paxson will be called and scheduled for an appointment.    Called Mr. Sarra at 6 pm and he stated he has not received a call from Dr. Doristine Devoid office.  Advised, that if he felt his symptoms are worsening(hand tremors and ataxia) over the weekend, that it may be in his best interest to go to the ED.  He stated understanding.  Will call Dr. Doristine Devoid office on Monday to assess appointment status related to above.

## 2015-11-29 NOTE — Telephone Encounter (Signed)
Called Mr. Corso to inform him that his information was sent to Dr. Santiago Glad Aquino's office for review at 11:57am.  Stated this RN will call with-in the next hour to determine if an appointment has been scheduled for review of his present status by Dr. Ellouise Newer.  He stated acceptance.

## 2015-11-29 NOTE — Progress Notes (Addendum)
As requested by Dr. Santiago Glad Aquino's office, sent Consult note, EOT note, insurance information, 09/19/15 MR Brain report and note from Gaspar Garbe regarding start of Decadron Taper which started on 11/26/15,and note from 11/29/15 regarding tremors of hands and mouth.

## 2015-12-02 ENCOUNTER — Encounter: Payer: Self-pay | Admitting: *Deleted

## 2015-12-02 NOTE — Progress Notes (Signed)
Note that Mr. Jacob Beck has an appointment on 12/16/15 with Dr. Ellouise Newer.

## 2015-12-10 ENCOUNTER — Telehealth: Payer: Self-pay | Admitting: Neurology

## 2015-12-10 ENCOUNTER — Telehealth: Payer: Self-pay

## 2015-12-10 DIAGNOSIS — C7931 Secondary malignant neoplasm of brain: Secondary | ICD-10-CM | POA: Diagnosis not present

## 2015-12-10 DIAGNOSIS — R251 Tremor, unspecified: Secondary | ICD-10-CM | POA: Diagnosis not present

## 2015-12-10 DIAGNOSIS — C649 Malignant neoplasm of unspecified kidney, except renal pelvis: Secondary | ICD-10-CM | POA: Diagnosis not present

## 2015-12-10 DIAGNOSIS — R2681 Unsteadiness on feet: Secondary | ICD-10-CM | POA: Diagnosis not present

## 2015-12-10 DIAGNOSIS — C642 Malignant neoplasm of left kidney, except renal pelvis: Secondary | ICD-10-CM

## 2015-12-10 NOTE — Progress Notes (Signed)
Subjective:   Jacob Beck was seen in consultation in the movement disorder clinic at the request of Shona Simpson.  His PCP is Gara Kroner, MD.  The evaluation is for tremor.  Pt is accompanied by his wife who supplements the history.  The records that were made available to me were reviewed. Record review was done on 12/10/2015 and took 45 minutes.  Pt is a 65 y.o. male who was dx with RCC in April 2016.  Surgical resection was attempted but unsuccessful given size of lesion.  Cerebral metastatic lesions noted in 09/2015 after MRI done for new onset headache.  I reviewed that MRI of the brain.  Multiple metastatic lesions noted, many with hemmorhagic component.  Started on decadron in August after the cerebral metastatic lesions were identified.  Decadron did cause resolution of the headaches.  Whole brain radiation tx started in 09/2015.  Tried to d/c steroids in September but severe dizziness and restarted the decadron and dizziness was better.  Attempt was made again at the beginning of October to wean off of the Decadron.  By the end of October, the patient began to complain about shaking so his Decadron was once again restarted.  He is currently on 2 mg tid.  When questioned in detail, however, the patient states that it is not "shaking" but rather an issue with dizziness and equilibrium.  He cannot look forward when he walks but has to look down.  When he says dizziness, he means both near syncopal (when he gets up quick) but mostly he means off balance sensation.  He had one fall.  He states that he was opening a back door and became near syncopal and fell but didn't "completely pass out."   He never has these spells of dizziness when sitting.  There seems to be some confusion between dizziness and tremor as wife states that he shakes when he eats but patient states that "this isn't a big problem."    Reports that he consumes 2-6 beers/day (he initially reports 2 beers but wife states "tell her  the truth...you know its about 6 beers a day") because that is the only thing that makes him feel good.  Reports that he previously had not had EtOh for 20 years and was a heavy drinker then.  States that he is drinking EtOH now to make him "feel good."   No Known Allergies  Outpatient Encounter Prescriptions as of 12/11/2015  Medication Sig  . dexamethasone (DECADRON) 4 MG tablet Take 0.5 tablets (2 mg total) by mouth 3 (three) times daily.  . Multiple Vitamins-Minerals (MULTIVITAMIN WITH MINERALS) tablet Take 1 tablet by mouth daily.  Marland Kitchen omeprazole (PRILOSEC OTC) 20 MG tablet Take 20 mg by mouth daily.  Marland Kitchen oxyCODONE (OXY IR/ROXICODONE) 5 MG immediate release tablet Take 1 tablet (5 mg total) by mouth every 4 (four) hours as needed for severe pain.  . pazopanib (VOTRIENT) 200 MG tablet Take 4 tablets (800 mg total) by mouth daily. TAKE 4 TABLETS (800MG ) ORALLY ONCE DAILY ON AN EMPTY STOMACH (AT LEAST1 HOUR BEFORE OR 2 HOURS AFTER A MEAL). SWALLOW WHOLE. DO NOT CRUSH.  . temazepam (RESTORIL) 30 MG capsule Take 1 capsule (30 mg total) by mouth at bedtime as needed for sleep.  . [DISCONTINUED] HYDROcodone-acetaminophen (NORCO/VICODIN) 5-325 MG tablet   . [DISCONTINUED] loperamide (IMODIUM A-D) 2 MG tablet Take 2 mg by mouth as needed for diarrhea or loose stools.  . [DISCONTINUED] ondansetron (ZOFRAN) 4 MG tablet Take  1 tablet (4 mg total) by mouth every 8 (eight) hours as needed for nausea or vomiting.  . [DISCONTINUED] prochlorperazine (COMPAZINE) 10 MG tablet Take 1 tablet (10 mg total) by mouth every 6 (six) hours as needed for nausea or vomiting.   No facility-administered encounter medications on file as of 12/11/2015.     Past Medical History:  Diagnosis Date  . GERD (gastroesophageal reflux disease)    occ  . left renal cell ca dx'd 04/2014   renal-left  . Renal mass     Past Surgical History:  Procedure Laterality Date  . FOOT SURGERY Right 2010   tendon donor  . INGUINAL HERNIA  REPAIR Right 10/15/2014   Procedure: RIGHT INGUINAL HERNIA REPAIR WITH MESH;  Surgeon: Rolm Bookbinder, MD;  Location: [facility];  Service: General;  Laterality: Right;  . INSERTION OF MESH Right 10/15/2014   Procedure: INSERTION OF MESH;  Surgeon: Rolm Bookbinder, MD;  Location: [facility];  Service: General;  Laterality: Right;  . KIDNEY SURGERY Right 3/16   attempted removal of renal mass but decided to dangerous when opened abdomen    Social History   Social History  . Marital status: Married    Spouse name: N/A  . Number of children: N/A  . Years of education: N/A   Occupational History  . Not on file.   Social History Main Topics  . Smoking status: Former Smoker    Packs/day: 1.00    Years: 10.00    Types: Cigarettes    Quit date: 10/10/1999  . Smokeless tobacco: Not on file  . Alcohol use Yes     Comment: beers every night  . Drug use: No  . Sexual activity: Not on file   Other Topics Concern  . Not on file   Social History Narrative  . No narrative on file    Family Status  Relation Status  . Brother Alive  . Mother Deceased  . Father Alive    Review of Systems Admits to poor energy and fatigue.  Admits to loss of appetite but it is better than it was.  Admits to poor memory.  A complete 10 system ROS was obtained and was negative apart from what is mentioned.   Objective:   VITALS:   Vitals:   12/11/15 0818  BP: 138/80  Pulse: 64  Weight: 158 lb (71.7 kg)  Height: 6' (1.829 m)   Gen:  Appears stated age and in NAD.  Skin has loss of color.   HEENT:  Normocephalic, atraumatic. The mucous membranes are moist. The superficial temporal arteries are without ropiness or tenderness. Cardiovascular: Regular rate and rhythm. Lungs: Clear to auscultation bilaterally. Neck: There are no carotid bruits noted bilaterally.  NEUROLOGICAL:  Orientation:  The patient is alert and oriented x 3.  Recent memory intact.  Requires assist of wife for remote memory  (historical).  Attention span and concentration are normal.  Able to name objects and repeat without trouble.  Fund of knowledge is appropriate Cranial nerves: There is good facial symmetry. The pupils are equal round and reactive to light bilaterally. Fundoscopic exam is attempted but the disc margins are not well visualized bilaterally. Extraocular muscles are intact and visual fields are full to confrontational testing. Speech is fluent and clear. Soft palate rises symmetrically and there is no tongue deviation. Hearing is intact to conversational tone. Tone: Tone is good throughout. Sensation: Sensation is intact to light touch and pinprick throughout (facial, trunk, extremities). Vibration is intact  at the bilateral big toe. There is no extinction with double simultaneous stimulation. There is no sensory dermatomal level identified. Coordination:  The patient has no dysdiadichokinesia or dysmetria. Motor: Strength is 5/5 in the bilateral upper and lower extremities.  Shoulder shrug is equal bilaterally.  There is no pronator drift.  There are no fasciculations noted. DTR's: Deep tendon reflexes are 2+/4 at the bilateral biceps, triceps, brachioradialis, patella and achilles.  Plantar responses are downgoing bilaterally. Gait and Station: The patient is able to ambulate without difficulty.  He is mildly wide based.  He cannot ambulate in a tandem fashion.  He has difficulty walking on his toes.  He is able to walk on the heels.  He is able to stand in the Romberg position with eyes open and closed.  MOVEMENT EXAM: Tremor:  There is jaw tremor.  There is no postural tremor.  There is intention tremor, more on the left than the right.  He has no difficulty with Archimedes spirals.  LABS    Chemistry      Component Value Date/Time   NA 138 11/13/2015 0854   K 4.8 11/13/2015 0854   CL 105 10/15/2014 0932   CO2 22 11/13/2015 0854   BUN 25.2 11/13/2015 0854   CREATININE 0.9 11/13/2015 0854        Component Value Date/Time   CALCIUM 9.5 11/13/2015 0854   ALKPHOS 86 11/13/2015 0854   AST 20 11/13/2015 0854   ALT 20 11/13/2015 0854   BILITOT 0.63 11/13/2015 0854     Lab Results  Component Value Date   WBC 7.0 11/13/2015   HGB 13.0 11/13/2015   HCT 41.7 11/13/2015   MCV 98.1 (H) 11/13/2015   PLT 249 11/13/2015        Assessment/Plan:   1.  Tremor  -It is overall fairly mild and definitely was not the disabling complaint today.  This seemed to be a bigger complaint for the wife and the patient.  I wonder if the recent increase in alcohol is playing an issue in many of his complaints.  However, given recent hope brain radiation, we did decide to go ahead and do an MRI of the brain.  Acute radiation necrosis certainly can cause various symptoms, although tremor would be somewhat atypical.  -Patient really wants no treatment for this, as it is really not that bothersome to him.  I would be very leery about using a medication such as clonazepam and him given the alcohol use.  If particularly bothersome, we could try something like primidone.  We can address this in the future if he decides that he wants something.  2.  Dizziness and ataxia  -As above, I am concerned about his alcohol intake.  Reports a history of alcoholism with sobriety for 20 years and is now greatly picking up his alcohol intake.  I wonder if this is not the primary etiology for symptoms of dizziness and ataxia.  As above, however, we should go ahead and do an MRI of the brain.  Certainly could have vasogenic edema or, on the other hand, could have radiation necrosis.  However, I did not really see anything focal or lateralizing on his examination today.  -Talked to the patient and his wife about the need to slowly decrease and taper alcohol intake.  -Called Shona Simpson and discussed the case on the phone with her.  3.  Follow-up will depend on the results of the above.  Greater than 50% of the 45 minute  visit  was spent in counseling, as above.  Time in visit today did not include the 45 minute record review which took place on 12/10/2015.  CC:  Gara Kroner, MD

## 2015-12-10 NOTE — Telephone Encounter (Signed)
Jacob Beck- Dr. Tammi Klippel and [facility] PA called and was checking on the pat's appt.  She said if Dr. Carles Collet needed to speak with her about the pat to call - [phone]

## 2015-12-10 NOTE — Telephone Encounter (Signed)
Faxed office notes to [facility] at [employer] per request for disability

## 2015-12-11 ENCOUNTER — Ambulatory Visit (INDEPENDENT_AMBULATORY_CARE_PROVIDER_SITE_OTHER): Payer: Medicare HMO | Admitting: Neurology

## 2015-12-11 ENCOUNTER — Encounter: Payer: Self-pay | Admitting: Neurology

## 2015-12-11 VITALS — BP 138/80 | HR 64 | Ht 72.0 in | Wt 158.0 lb

## 2015-12-11 DIAGNOSIS — C649 Malignant neoplasm of unspecified kidney, except renal pelvis: Secondary | ICD-10-CM

## 2015-12-11 DIAGNOSIS — R251 Tremor, unspecified: Secondary | ICD-10-CM

## 2015-12-11 DIAGNOSIS — Z7289 Other problems related to lifestyle: Secondary | ICD-10-CM

## 2015-12-11 DIAGNOSIS — C642 Malignant neoplasm of left kidney, except renal pelvis: Secondary | ICD-10-CM

## 2015-12-11 DIAGNOSIS — C7931 Secondary malignant neoplasm of brain: Secondary | ICD-10-CM | POA: Diagnosis not present

## 2015-12-11 DIAGNOSIS — Z789 Other specified health status: Secondary | ICD-10-CM

## 2015-12-11 DIAGNOSIS — R2681 Unsteadiness on feet: Secondary | ICD-10-CM | POA: Diagnosis not present

## 2015-12-11 NOTE — Patient Instructions (Signed)
1. We have sent a referral to [facility] for your MRI and they will call you directly to schedule your appt. They are located at [address] If you need to contact them directly please call [phone].   

## 2015-12-13 ENCOUNTER — Telehealth: Payer: Self-pay | Admitting: Oncology

## 2015-12-13 ENCOUNTER — Other Ambulatory Visit (HOSPITAL_BASED_OUTPATIENT_CLINIC_OR_DEPARTMENT_OTHER): Payer: Medicare HMO

## 2015-12-13 ENCOUNTER — Ambulatory Visit (HOSPITAL_BASED_OUTPATIENT_CLINIC_OR_DEPARTMENT_OTHER): Payer: Medicare HMO | Admitting: Oncology

## 2015-12-13 ENCOUNTER — Other Ambulatory Visit: Payer: Self-pay | Admitting: *Deleted

## 2015-12-13 VITALS — BP 149/81 | HR 88 | Temp 97.8°F | Resp 18 | Wt 158.0 lb

## 2015-12-13 DIAGNOSIS — C7931 Secondary malignant neoplasm of brain: Secondary | ICD-10-CM | POA: Diagnosis not present

## 2015-12-13 DIAGNOSIS — R5383 Other fatigue: Secondary | ICD-10-CM | POA: Diagnosis not present

## 2015-12-13 DIAGNOSIS — C649 Malignant neoplasm of unspecified kidney, except renal pelvis: Secondary | ICD-10-CM

## 2015-12-13 DIAGNOSIS — C642 Malignant neoplasm of left kidney, except renal pelvis: Secondary | ICD-10-CM | POA: Diagnosis not present

## 2015-12-13 DIAGNOSIS — M791 Myalgia: Secondary | ICD-10-CM | POA: Diagnosis not present

## 2015-12-13 LAB — COMPREHENSIVE METABOLIC PANEL
ALBUMIN: 2.7 g/dL — AB (ref 3.5–5.0)
ALK PHOS: 166 U/L — AB (ref 40–150)
ALT: 72 U/L — ABNORMAL HIGH (ref 0–55)
AST: 39 U/L — AB (ref 5–34)
Anion Gap: 11 mEq/L (ref 3–11)
BUN: 38.1 mg/dL — ABNORMAL HIGH (ref 7.0–26.0)
CHLORIDE: 103 meq/L (ref 98–109)
CO2: 23 meq/L (ref 22–29)
Calcium: 8.9 mg/dL (ref 8.4–10.4)
Creatinine: 0.8 mg/dL (ref 0.7–1.3)
GLUCOSE: 131 mg/dL (ref 70–140)
Potassium: 4.6 mEq/L (ref 3.5–5.1)
SODIUM: 137 meq/L (ref 136–145)
Total Bilirubin: 0.61 mg/dL (ref 0.20–1.20)
Total Protein: 7 g/dL (ref 6.4–8.3)

## 2015-12-13 LAB — CBC WITH DIFFERENTIAL/PLATELET
BASO%: 0.1 % (ref 0.0–2.0)
BASOS ABS: 0 10*3/uL (ref 0.0–0.1)
EOS ABS: 0 10*3/uL (ref 0.0–0.5)
EOS%: 0 % (ref 0.0–7.0)
HCT: 42.8 % (ref 38.4–49.9)
HGB: 13.7 g/dL (ref 13.0–17.1)
LYMPH%: 7.5 % — AB (ref 14.0–49.0)
MCH: 31.7 pg (ref 27.2–33.4)
MCHC: 32 g/dL (ref 32.0–36.0)
MCV: 99.1 fL — AB (ref 79.3–98.0)
MONO#: 0.5 10*3/uL (ref 0.1–0.9)
MONO%: 4.8 % (ref 0.0–14.0)
NEUT#: 9.5 10*3/uL — ABNORMAL HIGH (ref 1.5–6.5)
NEUT%: 87.6 % — ABNORMAL HIGH (ref 39.0–75.0)
Platelets: 145 10*3/uL (ref 140–400)
RBC: 4.32 10*6/uL (ref 4.20–5.82)
RDW: 22.1 % — ABNORMAL HIGH (ref 11.0–14.6)
WBC: 10.9 10*3/uL — ABNORMAL HIGH (ref 4.0–10.3)
lymph#: 0.8 10*3/uL — ABNORMAL LOW (ref 0.9–3.3)

## 2015-12-13 MED ORDER — TEMAZEPAM 30 MG PO CAPS: 30.0000 mg | ORAL_CAPSULE | Freq: Every evening | ORAL | 1 refills | Status: DC | PRN

## 2015-12-13 NOTE — Telephone Encounter (Signed)
Appointments scheduled per 11/10 LOS. Patient given AVS report and calendars with future scheduled appointments.  Patient aware of CT scan. Two bottles of contrast and instructions given to patient.

## 2015-12-13 NOTE — Progress Notes (Signed)
Hematology and Oncology Follow Up Visit  Jacob Beck [id number] December 19, 1950 65 y.o. 12/13/2015 1:18 PM   Principle Diagnosis: 65 year old gentleman with renal cell carcinoma diagnosed in April 2016. He presented with a large tumor on the left kidney measuring 15 cm and extending into the retroperitoneum causing proximal renal vein thrombosis.  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   Prior Therapy:  Attempted surgical resection on 06/01/2014 that was unsuccessful given the size of tumor. Whole brain radiation therapy completed on 10/04/2015.  Current therapy:   Votrient 800 mg daily started on 06/07/2014. He is currently receiving 600 milligrams daily for better tolerance.   Interim History: Mr. Perpich presents today for a follow-up visit with his wife. Since the last visit, he reports some decline in his overall health. He is reporting more fatigue, lethargy and periodic unsteadiness. He also noted some overall decline in his performance status and activity level. He was evaluated by neurology and MRI of the brain is scheduled. He is currently on steroid taper which has affected his overall appetite. He lost 5 pounds since the last visit although he is trying to use nutritional supplement to boost his weight.  He continues to take Votrient without any major complications at this time. He tolerated 600 mg dosing without complaints. He denied any nausea, vomiting or abdominal pain. He denied any hemoptysis or out of control blood pressure issues. He is reporting more loose habits and also change in the taste of certain foods. He denied any abdominal pain or arthralgias. He does report periodic hip pain he takes oxycodone for it very infrequently.   He does not report any fevers, chills or sweats. He does not report any chest pain, palpitation, orthopnea, leg edema. He does not report any cough, hemoptysis, hematemesis. He does not report any early satiety or abdominal distention. He  does not report any skeletal pains to his back pain shoulder pain or hip pain. He does not report any lymphadenopathy or petechiae. Remainder of review of systems unremarkable.   Medications: I have reviewed the patient's current medications.  Current Outpatient Prescriptions  Medication Sig Dispense Refill  . dexamethasone (DECADRON) 4 MG tablet Take 0.5 tablets (2 mg total) by mouth 3 (three) times daily. 90 tablet 0  . Multiple Vitamins-Minerals (MULTIVITAMIN WITH MINERALS) tablet Take 1 tablet by mouth daily.    Marland Kitchen omeprazole (PRILOSEC OTC) 20 MG tablet Take 20 mg by mouth daily.    Marland Kitchen oxyCODONE (OXY IR/ROXICODONE) 5 MG immediate release tablet Take 1 tablet (5 mg total) by mouth every 4 (four) hours as needed for severe pain. 90 tablet 0  . pazopanib (VOTRIENT) 200 MG tablet Take 4 tablets (800 mg total) by mouth daily. TAKE 4 TABLETS (800MG ) ORALLY ONCE DAILY ON AN EMPTY STOMACH (AT LEAST1 HOUR BEFORE OR 2 HOURS AFTER A MEAL). SWALLOW WHOLE. DO NOT CRUSH. (Patient taking differently: Take 800 mg by mouth daily. TAKE 4 TABLETS (800MG ) ORALLY ONCE DAILY ON AN EMPTY STOMACH (AT LEAST1 HOUR BEFORE OR 2 HOURS AFTER A MEAL). SWALLOW WHOLE. DO NOT CRUSH.) 120 tablet 0  . temazepam (RESTORIL) 30 MG capsule Take 1 capsule (30 mg total) by mouth at bedtime as needed for sleep. 30 capsule 0   No current facility-administered medications for this visit.      Allergies:  No Known Allergies  Past Medical History, Surgical history, Social history, and Family History were reviewed and updated.   Physical Exam: Blood pressure (!) 149/81, pulse 88, temperature 97.8  F (36.6 C), temperature source Oral, resp. rate 18, weight 158 lb (71.7 kg). ECOG: 1 General appearance: Alert, awake gentleman appeared without distress. Head: Normocephalic, without obvious abnormality. No oral ulcers or lesions. Neck: no adenopathy no thyroid masses. Lymph nodes: Cervical, supraclavicular, and axillary nodes  normal. Heart:regular rate and rhythm, S1, S2 normal, no murmur, click, rub or gallop Lung:chest clear, no wheezing, rales, normal symmetric air entry Abdomin: soft, non-tender, without masses or organomegaly no shifting dullness or ascites. EXT:no erythema, induration, or nodules Neurological examination: No deficits noted. He is ambulating without any difficulties. Resting tremor is noted.   Lab Results: Lab Results  Component Value Date   WBC 10.9 (H) 12/13/2015   HGB 13.7 12/13/2015   HCT 42.8 12/13/2015   MCV 99.1 (H) 12/13/2015   PLT 145 12/13/2015     Chemistry      Component Value Date/Time   NA 138 11/13/2015 0854   K 4.8 11/13/2015 0854   CL 105 10/15/2014 0932   CO2 22 11/13/2015 0854   BUN 25.2 11/13/2015 0854   CREATININE 0.9 11/13/2015 0854      Component Value Date/Time   CALCIUM 9.5 11/13/2015 0854   ALKPHOS 86 11/13/2015 0854   AST 20 11/13/2015 0854   ALT 20 11/13/2015 0854   BILITOT 0.63 11/13/2015 2265      65 year old gentleman with the following issues:  1.Renal cell carcinoma presented with a large mass measuring 15.0 x 14.1 x 9.9 cm left renal mass extending off the upper pole of the left kidney into the retroperitoneum. He has also evidence of lymphadenopathy and small pulmonary nodules. He presented with abdominal pain and 20 pound weight loss. This is a biopsy proven to be renal cell carcinoma with a biopsy done on 05/04/2014. Attempted surgical resection for debulking purposes was unsuccessful.  He is currently on Votrient 800 mg daily.   CT scan on 10/15/2015 continues to show stable disease. Despite his CNS metastasis, his systemic disease appears reasonably controlled.   He tolerated Votrient at a 600 mg dosing much better at this time. The plan is to continue with the same dose and schedule and repeat imaging studies in one month.    2. Brain metastasis: Detected on MRI on 09/05/2015. He completed whole brain radiation and repeat imaging  studies in the future will determine any other intervention needed. His repeat MRI is scheduled in the immediate future. He has developed some worsening neurological symptoms and cognitive decline. Further treatments will be determined by the results of the MRI.  3. Diffuse body pain: She is currently on oxycodone which helped his body pain.  4. Nausea: Managed at this time with Compazine.  5. Diarrhea: Resolved at this time.  6. Liver function surveillance: No abnormalities in his liver function test on 10/15/2015.  7. Prognosis: He understands that he has an incurable malignancy and any treatment is palliative in nature at this time. His performance status remains reasonable to continue current palliative approach.  8. Follow-up: Will be in 4 weeks.    [id number], MD 11/10/20171:18 PM

## 2015-12-13 NOTE — Telephone Encounter (Signed)
Appointments scheduled per 11/10 LOS. Patient given AVS report and calendars with future scheduled appointments. °

## 2015-12-16 ENCOUNTER — Other Ambulatory Visit: Payer: Self-pay | Admitting: Oncology

## 2015-12-16 ENCOUNTER — Ambulatory Visit: Payer: Medicare HMO | Admitting: Neurology

## 2015-12-17 ENCOUNTER — Other Ambulatory Visit: Payer: Self-pay | Admitting: *Deleted

## 2015-12-17 MED ORDER — OXYCODONE HCL 5 MG PO TABS: 5.0000 mg | ORAL_TABLET | ORAL | 0 refills | Status: DC | PRN

## 2015-12-17 NOTE — Telephone Encounter (Signed)
Called patient to let him know his prescription is ready for pick up. Patient verbalized understanding.

## 2015-12-20 ENCOUNTER — Other Ambulatory Visit: Payer: Self-pay | Admitting: *Deleted

## 2015-12-20 DIAGNOSIS — N2889 Other specified disorders of kidney and ureter: Secondary | ICD-10-CM

## 2015-12-20 MED ORDER — PAZOPANIB HCL 200 MG PO TABS: 800.0000 mg | ORAL_TABLET | Freq: Every day | ORAL | 0 refills | Status: DC

## 2015-12-21 ENCOUNTER — Ambulatory Visit
Admission: RE | Admit: 2015-12-21 | Discharge: 2015-12-21 | Disposition: A | Discharge status: Home / Self Care | Payer: Medicare HMO | Source: Ambulatory Visit | Attending: Neurology | Admitting: Neurology

## 2015-12-21 DIAGNOSIS — R2681 Unsteadiness on feet: Secondary | ICD-10-CM

## 2015-12-21 DIAGNOSIS — R251 Tremor, unspecified: Secondary | ICD-10-CM | POA: Diagnosis not present

## 2015-12-21 DIAGNOSIS — C649 Malignant neoplasm of unspecified kidney, except renal pelvis: Secondary | ICD-10-CM

## 2015-12-21 MED ORDER — GADOBENATE DIMEGLUMINE 529 MG/ML IV SOLN
14.0000 mL | Freq: Once | INTRAVENOUS | Status: AC | PRN
  Administered 2015-12-21: 14 mL via INTRAVENOUS

## 2015-12-23 ENCOUNTER — Encounter: Payer: Self-pay | Admitting: *Deleted

## 2015-12-23 ENCOUNTER — Telehealth: Payer: Self-pay | Admitting: *Deleted

## 2015-12-23 ENCOUNTER — Telehealth: Payer: Self-pay | Admitting: Neurology

## 2015-12-23 NOTE — Progress Notes (Signed)
Called patient home phone spoke with the wife,   Taper decadron dose per Shona Simpson, PA 1. Take 2mg  tablet oral 2 x a day for 1 week(7 days) 2. Take 2mg  tablet  Oral daily  For 1 week(7Days) 3. Take 2mg  tablet oral every other day for  1 week then Stop Wife read back correctly tapered doses, thanked this RN and Bryson Ha for quick return call, and stated she hops this stops his shaking and that he will be able to sleep better' 3:09 PM

## 2015-12-23 NOTE — Telephone Encounter (Signed)
-----   Message from [place], DO sent at 12/23/2015  7:40 AM EST ----- Jacob Beck, Let pt/wife know that MRI looked good (better than previous) and no evidence of radiation injury to brain and brain mets no longer enhancing with contrast.  Would f/u with oncology for their further recommendations and [place] prn

## 2015-12-23 NOTE — Telephone Encounter (Signed)
Wife called saying Dr. Carles Collet ordered MRI Brain  and was done Saturday gave wife and patient results, stating  The shaking  And tremors, instability not caused by the brain, she wants to know if  Patient can stop  Taking the steroid, asked that Bryson Ha or Dr. Lisbeth Renshaw to call gher, informed her that Dr. Lisbeth Renshaw is off all week, and I would speak with Bryson Ha when she was free, and ask  Her to call you 2:59 PM

## 2015-12-23 NOTE — Telephone Encounter (Signed)
Patient and wife made aware

## 2015-12-27 ENCOUNTER — Other Ambulatory Visit: Payer: Self-pay | Admitting: *Deleted

## 2015-12-27 ENCOUNTER — Encounter: Payer: Self-pay | Admitting: Oncology

## 2015-12-27 MED ORDER — TEMAZEPAM 30 MG PO CAPS: 30.0000 mg | ORAL_CAPSULE | Freq: Every evening | ORAL | 1 refills | Status: DC | PRN

## 2016-01-06 ENCOUNTER — Telehealth: Payer: Self-pay | Admitting: *Deleted

## 2016-01-06 NOTE — Telephone Encounter (Signed)
Patient c/o leg weakness, has trouble standing up. Would like CT of legs added to up coming CT appt.

## 2016-01-07 ENCOUNTER — Telehealth: Payer: Self-pay | Admitting: *Deleted

## 2016-01-07 ENCOUNTER — Encounter (HOSPITAL_COMMUNITY): Payer: Self-pay | Admitting: Emergency Medicine

## 2016-01-07 ENCOUNTER — Emergency Department (HOSPITAL_COMMUNITY)
Admission: EM | Admit: 2016-01-07 | Discharge: 2016-01-07 | Disposition: A | Discharge status: Home / Self Care | Payer: Medicare HMO | Attending: Emergency Medicine | Admitting: Emergency Medicine

## 2016-01-07 ENCOUNTER — Emergency Department (HOSPITAL_COMMUNITY): Payer: Medicare HMO

## 2016-01-07 DIAGNOSIS — G936 Cerebral edema: Secondary | ICD-10-CM | POA: Diagnosis not present

## 2016-01-07 DIAGNOSIS — R251 Tremor, unspecified: Secondary | ICD-10-CM | POA: Diagnosis not present

## 2016-01-07 DIAGNOSIS — C7931 Secondary malignant neoplasm of brain: Secondary | ICD-10-CM | POA: Diagnosis not present

## 2016-01-07 DIAGNOSIS — R42 Dizziness and giddiness: Secondary | ICD-10-CM

## 2016-01-07 DIAGNOSIS — Z87891 Personal history of nicotine dependence: Secondary | ICD-10-CM | POA: Diagnosis not present

## 2016-01-07 DIAGNOSIS — C649 Malignant neoplasm of unspecified kidney, except renal pelvis: Secondary | ICD-10-CM | POA: Diagnosis not present

## 2016-01-07 DIAGNOSIS — R531 Weakness: Secondary | ICD-10-CM | POA: Diagnosis not present

## 2016-01-07 LAB — BASIC METABOLIC PANEL
Anion gap: 10 (ref 5–15)
BUN: 26 mg/dL — AB (ref 6–20)
CHLORIDE: 98 mmol/L — AB (ref 101–111)
CO2: 30 mmol/L (ref 22–32)
CREATININE: 0.68 mg/dL (ref 0.61–1.24)
Calcium: 8.7 mg/dL — ABNORMAL LOW (ref 8.9–10.3)
GFR calc Af Amer: >60 mL/min (ref >60)
GFR calc non Af Amer: >60 mL/min (ref >60)
GLUCOSE: 114 mg/dL — AB (ref 65–99)
POTASSIUM: 4.5 mmol/L (ref 3.5–5.1)
Sodium: 138 mmol/L (ref 135–145)

## 2016-01-07 LAB — URINALYSIS, ROUTINE W REFLEX MICROSCOPIC
BACTERIA UA: NONE SEEN
BILIRUBIN URINE: NEGATIVE
Glucose, UA: NEGATIVE mg/dL
Hgb urine dipstick: NEGATIVE
Ketones, ur: 5 mg/dL — AB
LEUKOCYTES UA: NEGATIVE
Nitrite: NEGATIVE
Protein, ur: 30 mg/dL — AB
SPECIFIC GRAVITY, URINE: 1.017 (ref 1.005–1.030)
pH: 8 (ref 5.0–8.0)

## 2016-01-07 LAB — CBC
HEMATOCRIT: 35.4 % — AB (ref 39.0–52.0)
Hemoglobin: 11.1 g/dL — ABNORMAL LOW (ref 13.0–17.0)
MCH: 32 pg (ref 26.0–34.0)
MCHC: 31.4 g/dL (ref 30.0–36.0)
MCV: 102 fL — AB (ref 78.0–100.0)
Platelets: 166 10*3/uL (ref 150–400)
RBC: 3.47 MIL/uL — ABNORMAL LOW (ref 4.22–5.81)
RDW: 21.9 % — AB (ref 11.5–15.5)
WBC: 3.8 10*3/uL — ABNORMAL LOW (ref 4.0–10.5)

## 2016-01-07 LAB — CBG MONITORING, ED: GLUCOSE-CAPILLARY: 105 mg/dL — AB (ref 65–99)

## 2016-01-07 MED ORDER — SODIUM CHLORIDE 0.9 % IV BOLUS (SEPSIS)
1000.0000 mL | Freq: Once | INTRAVENOUS | Status: AC
  Administered 2016-01-07: 1000 mL via INTRAVENOUS

## 2016-01-07 NOTE — Telephone Encounter (Signed)
He needs urgent evaluation in the ED for CNS issues: cancer mets to the brain, CVA etc.. Please advise him to do to ED to get brain scan. Full body scan otherwise already ordered.

## 2016-01-07 NOTE — ED Notes (Signed)
ED Provider at bedside. 

## 2016-01-07 NOTE — ED Provider Notes (Signed)
[facility] DEPT Provider Note   CSN: [id number] Arrival date & time: 01/07/16  1417     History   Chief Complaint Chief Complaint  Patient presents with  . Dizziness  . Tremors    HPI Jacob Beck is a 65 y.o. male.  HPI   65 year old male with history of renal cell carcinoma, with brain metastasis presenting with complaints of dizziness and tremors. Pt was diagnosed with renal cell carcinoma in April 2016, and developed brain metastasis in August 2017.  He finished brain radiation therapy on 10/04/2015.  He is currently taking Votrient.  For the past 3 weeks he has been experiencing diffuse tremors throughout his upper and lower extremities. Tremors is more prominent during the later part of the day. Tremors most noticeable in his arms.  There is no associated pain with these tremors. he also reported having trouble getting out of bed due to feeling lightheadedness and dizziness. Does not describe dizziness as room spinning sensation, but more of a disequilibrium sensation with positional changes.  He felt that his symptom has becoming increasingly worse. Also endorsed weight loss in his lower extremities with weakness. Denies having any fever, chills, severe headache, diplopia, neck pain, URI symptoms, chest pain, difficulty breathing, abdominal pain, back pain, nausea vomiting diarrhea. He has been eating and drinking fine. He did reach out to his oncologist yesterday and was told to come to ER for further evaluation. He is scheduled to have a CT scan of his abdomen and pelvis next week for further evaluation of his renal cell cancer.  Past Medical History:  Diagnosis Date  . GERD (gastroesophageal reflux disease)    occ  . left renal cell ca dx'd 04/2014   renal-left  . Renal mass     Patient Active Problem List   Diagnosis Date Noted  . Brain metastasis ([place]) 10/04/2015  . Renal cell carcinoma ([place]) 09/18/2015  . Renal mass   . Left renal mass     Past Surgical  History:  Procedure Laterality Date  . FOOT SURGERY Right 2010   tendon donor  . INGUINAL HERNIA REPAIR Right 10/15/2014   Procedure: RIGHT INGUINAL HERNIA REPAIR WITH MESH;  Surgeon: Rolm Bookbinder, MD;  Location: [facility];  Service: General;  Laterality: Right;  . INSERTION OF MESH Right 10/15/2014   Procedure: INSERTION OF MESH;  Surgeon: Rolm Bookbinder, MD;  Location: [facility];  Service: General;  Laterality: Right;  . KIDNEY SURGERY Right 3/16   attempted removal of renal mass but decided to dangerous when opened abdomen       Home Medications    Prior to Admission medications   Medication Sig Start Date End Date Taking? Authorizing Provider  dexamethasone (DECADRON) 4 MG tablet Take 0.5 tablets (2 mg total) by mouth 3 (three) times daily. 11/26/15   Hayden Pedro, PA-C  Multiple Vitamins-Minerals (MULTIVITAMIN WITH MINERALS) tablet Take 1 tablet by mouth daily.    Historical Provider, MD  omeprazole (PRILOSEC OTC) 20 MG tablet Take 20 mg by mouth daily.    Historical Provider, MD  oxyCODONE (OXY IR/ROXICODONE) 5 MG immediate release tablet Take 1 tablet (5 mg total) by mouth every 4 (four) hours as needed for severe pain. 12/17/15   Wyatt Portela, MD  pazopanib (VOTRIENT) 200 MG tablet Take 4 tablets (800 mg total) by mouth daily. TAKE 4 TABLETS (800MG ) ORALLY ONCE DAILY ON AN EMPTY STOMACH (AT LEAST1 HOUR BEFORE OR 2 HOURS AFTER A MEAL). SWALLOW WHOLE. DO NOT CRUSH.  12/20/15   Wyatt Portela, MD  temazepam (RESTORIL) 30 MG capsule Take 1 capsule (30 mg total) by mouth at bedtime as needed for sleep. 12/27/15   Wyatt Portela, MD    Family History Family History  Problem Relation Age of Onset  . Prostate cancer Brother   . Heart failure Mother   . Diabetes Mother   . Diabetes Father     Social History Social History  Substance Use Topics  . Smoking status: Former Smoker    Packs/day: 1.00    Years: 10.00    Types: Cigarettes    Quit date: 10/10/1999  . Smokeless  tobacco: Never Used  . Alcohol use Yes     Comment: 2-4 beers per night     Allergies   Patient has no known allergies.   Review of Systems Review of Systems  All other systems reviewed and are negative.    Physical Exam Updated Vital Signs BP 147/91 (BP Location: Right Arm)   Pulse 98   Temp 99.4 F (37.4 C) (Oral)   Resp 18   Ht 6' (1.829 m)   Wt 73.5 kg   SpO2 98%   BMI 21.97 kg/m   Physical Exam  Constitutional: He is oriented to person, place, and time. He appears well-developed and well-nourished. No distress.  HENT:  Head: Atraumatic.  Mouth/Throat: Oropharynx is clear and moist.  Eyes: Conjunctivae and EOM are normal. Pupils are equal, round, and reactive to light.  Neck: Neck supple.  Cardiovascular:  Tachycardia without murmurs, rubs or gallops  Pulmonary/Chest: Effort normal and breath sounds normal.  Abdominal: Soft. There is no tenderness.  Neurological: He is alert and oriented to person, place, and time. He has normal strength. He displays tremor. No cranial nerve deficit or sensory deficit. Coordination and gait normal. GCS eye subscore is 4. GCS verbal subscore is 5. GCS motor subscore is 6.  Diffuse unintentional tremors to upper extremities, more prominent than lower extremities with normal grip strength and full range of motion throughout all joints. tremors noticed in face and jaw  Skin: No rash noted.  Psychiatric: He has a normal mood and affect.  Nursing note and vitals reviewed.    ED Treatments / Results  Labs (all labs ordered are listed, but only abnormal results are displayed) Labs Reviewed  BASIC METABOLIC PANEL - Abnormal; Notable for the following:       Result Value   Chloride 98 (*)    Glucose, Bld 114 (*)    BUN 26 (*)    Calcium 8.7 (*)    All other components within normal limits  CBC - Abnormal; Notable for the following:    WBC 3.8 (*)    RBC 3.47 (*)    Hemoglobin 11.1 (*)    HCT 35.4 (*)    MCV 102.0 (*)    RDW  21.9 (*)    All other components within normal limits  URINALYSIS, ROUTINE W REFLEX MICROSCOPIC - Abnormal; Notable for the following:    APPearance HAZY (*)    Ketones, ur 5 (*)    Protein, ur 30 (*)    Squamous Epithelial / LPF 0-5 (*)    All other components within normal limits  CBG MONITORING, ED - Abnormal; Notable for the following:    Glucose-Capillary 105 (*)    All other components within normal limits    EKG  EKG Interpretation None       Radiology Ct Head Wo Contrast  Result  Date: 01/07/2016 CLINICAL DATA:  Generalized shaking.  Weakness.  Metastatic cancer. EXAM: CT HEAD WITHOUT CONTRAST TECHNIQUE: Contiguous axial images were obtained from the base of the skull through the vertex without intravenous contrast. COMPARISON:  MRI 12/21/2015 and 09/05/2015 FINDINGS: Brain: Brainstem and cerebellum are unremarkable. Within the right frontal lobe, there is a previously treated metastasis measuring up to 11 mm with hyperdensity. This could be due to calcification or low level blood products. There is a small amount of surrounding edema. This does not appear progressive on a size basis compared to the previous MRIs. Subcortical low-density at the right frontal vertex is noted at the site of a previously treated metastasis. There is no evidence of Teuscher disease progression or any new lesions. No hydrocephalus. No extra-axial collection. Vascular: No abnormal vascular finding. Skull: Negative Sinuses/Orbits: Left frontal sinus opacification. Other sinuses are clear as visualized. Orbits are negative. Other: None significant IMPRESSION: No evidence of definite disease progression or new finding. 11 mm hyperdensity in the right frontal lobe at the site of a previous treated metastasis. This could be lesion calcification or mild lesion hemorrhage. Small amount of surrounding low density. 1 cm low-density at the right posterior frontal vertex at the site of a treated metastasis. Electronically  Signed   By: Nelson Chimes M.D.   On: 01/07/2016 17:36    Procedures Procedures (including critical care time)  Medications Ordered in ED Medications - No data to display   Initial Impression / Assessment and Plan / ED Course  I have reviewed the triage vital signs and the nursing notes.  Pertinent labs & imaging results that were available during my care of the patient were reviewed by me and considered in my medical decision making (see chart for details).  Clinical Course     BP 148/83   Pulse 110   Temp 99.4 F (37.4 C) (Oral)   Resp 19   Ht 6' (1.829 m)   Wt 73.5 kg   SpO2 94%   BMI 21.97 kg/m    Final Clinical Impressions(s) / ED Diagnoses   Final diagnoses:  Tremor  Dizziness    New Prescriptions New Prescriptions   No medications on file   3:35 PM Pt presents with unintentional tremors x 3 weeks.  Currently on Votrient as treatment for renal cell carcinoma.  He also had brain metastasis s/p radiation and steroid treatment.  He has been off steroid x 1-2 weeks.    Pt report dizziness and disequilibrium with changing in position for the same duration.  He has had a brain MRI on 11/18 which shows improvement of his metastatic disease. Plan to check labs, perform orthostatic vital sign and will consult oncologist Dr. Barbaraann Faster for further recommendation.    3:53 PM Appreciate consultation from oncologist Dr. Barbaraann Faster who requested for a head CT non contrast to r/o acute bleed or acute changes.  If negative, pt can f/u outpt for further care.    7:14 PM Head CT demonstrates a hyperdense lesion in the frontal lobe measuring 27mm which could represents calcification vs a mild hemorrhage. I discussed this finding with Dr. Alen Blew, who felt pt can f/u outpt for further care as his sxs isn't acute.  Pt agrees with plan.  He is stable for discharge.  Return precaution discussed care discussed with Dr. Leonette Monarch.    Domenic Moras, PA-C 01/07/16 1916    Fatima Blank,  MD 01/09/16 [id number]

## 2016-01-07 NOTE — ED Triage Notes (Signed)
Patient reports dizziness and tremors x3 weeks. Patient takes chemo pills at home for cancer. Patient finished radiation for the cancer 3 weeks ago and it seems this started soon after that.

## 2016-01-07 NOTE — ED Notes (Signed)
Made PA aware that wife asking if he had spoken to Oncology yet or not.   PA going into room to speak with patient.

## 2016-01-07 NOTE — Telephone Encounter (Signed)
Spoke with sharon, patient's wife. Per dr Alen Blew, instructed her to take patient to the ER, he needs a brain scan, full body scan already ordered.

## 2016-01-07 NOTE — Telephone Encounter (Signed)
No note

## 2016-01-07 NOTE — Discharge Instructions (Signed)
Please call and follow up with your oncologist for further management of your condition.

## 2016-01-07 NOTE — Telephone Encounter (Signed)
"  This is Jacob Beck for [facility].  I need to talk with the nurse.  He needs a scan from the neck all the way down.  His whole body is shaking more.  Loosing weight and his legs are weak.  He can't walk and when he does, after a short time he collapses.  Stays in recliner and no longer tries to come to bed.  He's tired all the time.  The cancer is taking over and I do not know what to do."   Audible sense of distress in her voice and possible crying.  This nurse notified Dr. Alen Blew for evaluation.  Expect return call from provider's nurse.

## 2016-01-08 ENCOUNTER — Telehealth: Payer: Self-pay | Admitting: Medical Oncology

## 2016-01-08 ENCOUNTER — Other Ambulatory Visit: Payer: Self-pay | Admitting: Oncology

## 2016-01-08 DIAGNOSIS — C649 Malignant neoplasm of unspecified kidney, except renal pelvis: Secondary | ICD-10-CM

## 2016-01-08 DIAGNOSIS — C7931 Secondary malignant neoplasm of brain: Secondary | ICD-10-CM

## 2016-01-08 NOTE — Telephone Encounter (Signed)
Wife notified.

## 2016-01-08 NOTE — Telephone Encounter (Signed)
Pt seen in ED last night -had CT head. Wife asking if you want Deontrey to have MRI of brain before his f/u with you next week?

## 2016-01-08 NOTE — Telephone Encounter (Signed)
I will arrange for an MRI of the brain to be done before his next visit.

## 2016-01-10 ENCOUNTER — Encounter: Payer: Self-pay | Admitting: *Deleted

## 2016-01-10 ENCOUNTER — Telehealth: Payer: Self-pay | Admitting: *Deleted

## 2016-01-10 NOTE — Telephone Encounter (Signed)
Spoke with patient's wife, very tearful, states patient fell this morning and she had to get help to get him up. MRI requested by dr Alen Blew, was denied by the insurance co, since one was done last month. Per dr Alen Blew, keep appt for CT's scheduled for 01/14/16 and we will continue to work on getting MRI approved.

## 2016-01-14 ENCOUNTER — Ambulatory Visit (HOSPITAL_COMMUNITY)
Admission: EM | Admit: 2016-01-14 | Discharge: 2016-01-14 | Disposition: A | Discharge status: Home / Self Care | Payer: Medicare HMO | Attending: Oncology | Admitting: Oncology

## 2016-01-14 ENCOUNTER — Other Ambulatory Visit (HOSPITAL_BASED_OUTPATIENT_CLINIC_OR_DEPARTMENT_OTHER): Payer: Medicare HMO

## 2016-01-14 DIAGNOSIS — C649 Malignant neoplasm of unspecified kidney, except renal pelvis: Secondary | ICD-10-CM

## 2016-01-14 DIAGNOSIS — C642 Malignant neoplasm of left kidney, except renal pelvis: Secondary | ICD-10-CM

## 2016-01-14 DIAGNOSIS — N200 Calculus of kidney: Secondary | ICD-10-CM | POA: Insufficient documentation

## 2016-01-14 DIAGNOSIS — R918 Other nonspecific abnormal finding of lung field: Secondary | ICD-10-CM | POA: Diagnosis not present

## 2016-01-14 DIAGNOSIS — N2889 Other specified disorders of kidney and ureter: Secondary | ICD-10-CM | POA: Diagnosis not present

## 2016-01-14 LAB — CBC WITH DIFFERENTIAL/PLATELET
BASO%: 0.6 % (ref 0.0–2.0)
Basophils Absolute: 0 10*3/uL (ref 0.0–0.1)
EOS%: 0.1 % (ref 0.0–7.0)
Eosinophils Absolute: 0 10*3/uL (ref 0.0–0.5)
HCT: 36.6 % — ABNORMAL LOW (ref 38.4–49.9)
HGB: 11.6 g/dL — ABNORMAL LOW (ref 13.0–17.1)
LYMPH%: 24.6 % (ref 14.0–49.0)
MCH: 32.1 pg (ref 27.2–33.4)
MCHC: 31.6 g/dL — ABNORMAL LOW (ref 32.0–36.0)
MCV: 101.5 fL — ABNORMAL HIGH (ref 79.3–98.0)
MONO#: 0.6 10*3/uL (ref 0.1–0.9)
MONO%: 13.3 % (ref 0.0–14.0)
NEUT%: 61.4 % (ref 39.0–75.0)
NEUTROS ABS: 2.7 10*3/uL (ref 1.5–6.5)
PLATELETS: 361 10*3/uL (ref 140–400)
RBC: 3.6 10*6/uL — AB (ref 4.20–5.82)
RDW: 23.1 % — ABNORMAL HIGH (ref 11.0–14.6)
WBC: 4.4 10*3/uL (ref 4.0–10.3)
lymph#: 1.1 10*3/uL (ref 0.9–3.3)

## 2016-01-14 LAB — COMPREHENSIVE METABOLIC PANEL
ALT: 16 U/L (ref 0–55)
AST: 14 U/L (ref 5–34)
Albumin: 2.6 g/dL — ABNORMAL LOW (ref 3.5–5.0)
Alkaline Phosphatase: 107 U/L (ref 40–150)
Anion Gap: 12 mEq/L — ABNORMAL HIGH (ref 3–11)
BILIRUBIN TOTAL: 0.55 mg/dL (ref 0.20–1.20)
BUN: 20.8 mg/dL (ref 7.0–26.0)
CO2: 28 meq/L (ref 22–29)
CREATININE: 0.8 mg/dL (ref 0.7–1.3)
Calcium: 10.4 mg/dL (ref 8.4–10.4)
Chloride: 100 mEq/L (ref 98–109)
EGFR: >90 mL/min/{1.73_m2} (ref >90)
GLUCOSE: 112 mg/dL (ref 70–140)
Potassium: 4.5 mEq/L (ref 3.5–5.1)
SODIUM: 139 meq/L (ref 136–145)
TOTAL PROTEIN: 7.6 g/dL (ref 6.4–8.3)

## 2016-01-14 MED ORDER — IOPAMIDOL (ISOVUE-300) INJECTION 61%
INTRAVENOUS | Status: AC
  Filled 2016-01-14: qty 100

## 2016-01-14 MED ORDER — IOPAMIDOL (ISOVUE-300) INJECTION 61%
100.0000 mL | Freq: Once | INTRAVENOUS | Status: AC | PRN
  Administered 2016-01-14: 100 mL via INTRAVENOUS

## 2016-01-14 MED ORDER — SODIUM CHLORIDE 0.9 % IJ SOLN
INTRAMUSCULAR | Status: AC
  Filled 2016-01-14: qty 50

## 2016-01-16 ENCOUNTER — Ambulatory Visit (HOSPITAL_BASED_OUTPATIENT_CLINIC_OR_DEPARTMENT_OTHER): Payer: Medicare HMO | Admitting: Oncology

## 2016-01-16 ENCOUNTER — Other Ambulatory Visit: Payer: Self-pay | Admitting: *Deleted

## 2016-01-16 ENCOUNTER — Telehealth: Payer: Self-pay | Admitting: Oncology

## 2016-01-16 VITALS — BP 141/83 | HR 123 | Temp 98.2°F | Resp 18 | Ht 72.0 in | Wt 163.6 lb

## 2016-01-16 DIAGNOSIS — C7931 Secondary malignant neoplasm of brain: Secondary | ICD-10-CM | POA: Diagnosis not present

## 2016-01-16 DIAGNOSIS — C642 Malignant neoplasm of left kidney, except renal pelvis: Secondary | ICD-10-CM

## 2016-01-16 DIAGNOSIS — R110 Nausea: Secondary | ICD-10-CM | POA: Diagnosis not present

## 2016-01-16 DIAGNOSIS — M791 Myalgia: Secondary | ICD-10-CM | POA: Diagnosis not present

## 2016-01-16 DIAGNOSIS — C649 Malignant neoplasm of unspecified kidney, except renal pelvis: Secondary | ICD-10-CM

## 2016-01-16 DIAGNOSIS — R251 Tremor, unspecified: Secondary | ICD-10-CM

## 2016-01-16 DIAGNOSIS — R270 Ataxia, unspecified: Secondary | ICD-10-CM

## 2016-01-16 MED ORDER — OXYCODONE HCL 5 MG PO TABS: 5.0000 mg | ORAL_TABLET | ORAL | 0 refills | Status: DC | PRN

## 2016-01-16 NOTE — Progress Notes (Signed)
Hematology and Oncology Follow Up Visit  Jacob Beck:[id number] Dec 03, 1950 65 y.o. 01/16/2016 9:39 AM   Principle Diagnosis: 65 year old gentleman with renal cell carcinoma diagnosed in April 2016. He presented with a large tumor on the left kidney measuring 15 cm and extending into the retroperitoneum causing proximal renal vein thrombosis.  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   Prior Therapy:  Attempted surgical resection on 06/01/2014 that was unsuccessful given the size of tumor. Whole brain radiation therapy completed on 10/04/2015.  Current therapy:   Votrient 800 mg daily started on 06/07/2014. He is currently receiving 600 mg daily for better tolerance.   Interim History: [name] presents today for a follow-up visit with his wife. Since the last visit, he reports few complaints. His issues remain with intentional tremors, dizziness and unsteadiness. He has been evaluated urgently in the emergency department with a CT scan of the brain on 01/14/2016 which was unremarkable. He was also evaluated by neurology for the same issue as well. Despite that, he remains ambulatory and did report two falls. He denied any fractures. His appetite remains reasonable and hasn't lost any weight.   He continues to take Votrient without any major complications at this time. He tolerated 600 mg dosing without complaints. He denied any nausea, vomiting. He denied any hemoptysis or out of control blood pressure issues. He is reporting more loose habits and also change in the taste of certain foods. He is reporting occasional abdominal pain associated with food and does take oxycodone periodically. He takes oxycodone infrequently at this time although 1-2 times a day.    He does not report any fevers, chills or sweats. He does not report any chest pain, palpitation, orthopnea, leg edema. He does not report any cough, hemoptysis, hematemesis. He does not report any early satiety or abdominal  distention. He does not report any skeletal pains to his back pain shoulder pain or hip pain. He does not report any lymphadenopathy or petechiae. Remainder of review of systems unremarkable.   Medications: I have reviewed the patient's current medications.  Current Outpatient Prescriptions  Medication Sig Dispense Refill  . Multiple Vitamins-Minerals (MULTIVITAMIN WITH MINERALS) tablet Take 1 tablet by mouth daily.    Marland Kitchen omeprazole (PRILOSEC OTC) 20 MG tablet Take 20 mg by mouth daily.    Marland Kitchen oxyCODONE (OXY IR/ROXICODONE) 5 MG immediate release tablet Take 1 tablet (5 mg total) by mouth every 4 (four) hours as needed for severe pain. 90 tablet 0  . pazopanib (VOTRIENT) 200 MG tablet Take 4 tablets (800 mg total) by mouth daily. TAKE 4 TABLETS (800MG ) ORALLY ONCE DAILY ON AN EMPTY STOMACH (AT LEAST1 HOUR BEFORE OR 2 HOURS AFTER A MEAL). SWALLOW WHOLE. DO NOT CRUSH. 120 tablet 0  . temazepam (RESTORIL) 30 MG capsule Take 1 capsule (30 mg total) by mouth at bedtime as needed for sleep. 30 capsule 1   No current facility-administered medications for this visit.      Allergies:  No Known Allergies  Past Medical History, Surgical history, Social history, and Family History were reviewed and updated.   Physical Exam: Blood pressure (!) 141/83, pulse (!) 123, temperature 98.2 F (36.8 C), temperature source Oral, resp. rate 18, height 6' (1.829 m), weight 163 lb 9.6 oz (74.2 kg), SpO2 98 %. ECOG: 1 General appearance: Chronically ill-appearing gentleman appeared without distress. Head: Normocephalic, without obvious abnormality. No oral thrush noted. Neck: no adenopathy no thyroid masses. Lymph nodes: Cervical, supraclavicular, and axillary nodes normal.  Heart:regular rate and rhythm, S1, S2 normal, no murmur, click, rub or gallop Lung:chest clear, no wheezing, rales, normal symmetric air entry Abdomin: soft, non-tender, without masses or organomegaly no shifting dullness or ascites. EXT:no  erythema, induration, or nodules Neurological examination: No deficits noted. No difficulties with ambulation. Resting tremor noted.   Lab Results: Lab Results  Component Value Date   WBC 4.4 01/14/2016   HGB 11.6 (L) 01/14/2016   HCT 36.6 (L) 01/14/2016   MCV 101.5 (H) 01/14/2016   PLT 361 01/14/2016     Chemistry      Component Value Date/Time   NA 139 01/14/2016 1422   K 4.5 01/14/2016 1422   CL 98 (L) 01/07/2016 1510   CO2 28 01/14/2016 1422   BUN 20.8 01/14/2016 1422   CREATININE 0.8 01/14/2016 1422      Component Value Date/Time   CALCIUM 10.4 01/14/2016 1422   ALKPHOS 107 01/14/2016 1422   AST 14 01/14/2016 1422   ALT 16 01/14/2016 1422   BILITOT 0.55 01/14/2016 1422     EXAM: CT CHEST WITH CONTRAST  CT ABDOMEN AND PELVIS WITH AND WITHOUT CONTRAST  TECHNIQUE: Multidetector CT imaging of the chest was performed during intravenous contrast administration. Multidetector CT imaging of the abdomen and pelvis was performed following the standard protocol before and during bolus administration of intravenous contrast.  CONTRAST:  163mL ISOVUE-300 IOPAMIDOL (ISOVUE-300) INJECTION 61%  COMPARISON:  10/15/2015.  FINDINGS: CT CHEST FINDINGS  Cardiovascular: Scattered atherosclerotic calcification of the arterial vasculature. Heart size normal. No pericardial effusion.  Mediastinum/Nodes: No pathologically enlarged mediastinal, hilar or axillary lymph nodes. Esophagus is grossly unremarkable.  Lungs/Pleura: A few scattered nodular and ground-glass densities in the right lung are unchanged. Minimal scarring in the lower lobes. No pleural fluid. Airway is unremarkable.  Musculoskeletal: No worrisome lytic or sclerotic lesions.  CT ABDOMEN AND PELVIS FINDINGS  Hepatobiliary: Subcentimeter area enhancement in the inferior right hepatic lobe measures 8 mm, stable. Liver and gallbladder are otherwise unremarkable. No biliary ductal  dilatation.  Pancreas: Negative.  Spleen: Negative.  Adrenals/Urinary Tract: Right adrenal gland is unremarkable. Left adrenal gland is poorly visualized. Tiny stones are seen in the right kidney. Low-attenuation lesions in the right kidney are subcentimeter in size and difficult to further characterize. A 9 mm low-attenuation lesion in the lower pole right kidney (series 6, image 63 and series 16, image 56) measures 60 Hounsfield units and cannot be characterized as a cyst. A necrotic mass in the left kidney measures 10.1 x 10.7 x 9.9 cm and infiltrates into the posterior aspect of the lower pole. Ureters are decompressed. Bladder is grossly unremarkable.  Stomach/Bowel: Stomach, small bowel and colon are unremarkable.  Vascular/Lymphatic: Scattered atherosclerotic calcification of the arterial vasculature without abdominal aortic aneurysm. Tumor thrombus is seen within the left renal vein. Accessory/collateral vascular flow are on the left kidney. No pathologically enlarged lymph nodes.  Reproductive: Prostate is visualized.  Other: No free fluid.  Mesenteries and peritoneum are unremarkable.  Musculoskeletal: No worrisome lytic or sclerotic lesions. Degenerative changes are seen in the spine.  IMPRESSION: 1. Large necrotic left renal cell carcinoma with tumor thrombus in the left renal vein, as before. No definitive evidence of distant metastatic disease in the chest, abdomen or pelvis. 2. 9 mm lesion in the lower pole right kidney is too small to characterize but has attenuation characteristics inconsistent with a simple cyst. 3. Right nephrolithiasis. 4. Nodular and ground-glass attenuation densities in the right lung, stable from the most  recent prior examination. Continued attention on followup exams is warranted.  65 year old gentleman with the following issues:  1.Renal cell carcinoma presented with a large mass measuring 15.0 x 14.1 x 9.9 cm left renal  mass extending off the upper pole of the left kidney into the retroperitoneum. He has also evidence of lymphadenopathy and small pulmonary nodules. He presented with abdominal pain and 20 pound weight loss. This is a biopsy proven to be renal cell carcinoma with a biopsy done on 05/04/2014. Attempted surgical resection for debulking purposes was unsuccessful.  He is currently on Votrient 600 mg daily.   CT scan on 01/14/2016 was personally reviewed and showed no evidence of systemic metastasis. He continues to have residual left renal mass at this time.  The plan is to continue with the same dose and schedule and repeat imaging studies in 3 months.   2. Brain metastasis: Detected on MRI on 09/05/2015. He completed whole brain radiation and repeat imaging studies in the future will determine any other intervention needed.   I recommended repeat MRI in the near future to detect any recurrent disease that may be contributing to his recent symptoms.  3. Diffuse body pain: She is currently on oxycodone which helped his body pain. Was refilled today.  4. Nausea: Managed at this time with Compazine. Very little reported at this time.  5. Tremors and unsteadiness with ataxia: This could be exacerbated by his alcohol consumption which I discussed with him today. I do not think increased alcohol consumption is in his best interest because a combination associated with it. I have recommended decreasing his alcohol intake and we'll consider medication for his essential tremor if he is willing to in the future.  6. Liver function surveillance: No abnormalities in his liver function test on 01/14/2016.  7. Prognosis: He has an incurable malignancy and any treatment is palliative in nature at this time. His performance status remains reasonable to continue current palliative approach.  8. Follow-up: Will be in 4 weeks.    [facility], MD 12/14/20179:39 AM

## 2016-01-16 NOTE — Telephone Encounter (Signed)
Appointments scheduled per 01/16/16 los. A copy of the appointment schedule and AVS report, was given to th e patient per 01/16/16 los.

## 2016-01-17 ENCOUNTER — Telehealth: Payer: Self-pay | Admitting: *Deleted

## 2016-01-17 ENCOUNTER — Other Ambulatory Visit: Payer: Self-pay | Admitting: *Deleted

## 2016-01-17 DIAGNOSIS — C642 Malignant neoplasm of left kidney, except renal pelvis: Secondary | ICD-10-CM | POA: Diagnosis not present

## 2016-01-17 DIAGNOSIS — Z6822 Body mass index (BMI) 22.0-22.9, adult: Secondary | ICD-10-CM | POA: Diagnosis not present

## 2016-01-17 DIAGNOSIS — K219 Gastro-esophageal reflux disease without esophagitis: Secondary | ICD-10-CM | POA: Diagnosis not present

## 2016-01-17 DIAGNOSIS — Z0000 Encounter for general adult medical examination without abnormal findings: Secondary | ICD-10-CM | POA: Diagnosis not present

## 2016-01-17 MED ORDER — ONDANSETRON 8 MG PO TBDP: 8.0000 mg | ORAL_TABLET | Freq: Three times a day (TID) | ORAL | 1 refills | Status: DC | PRN

## 2016-01-17 NOTE — Telephone Encounter (Signed)
This RN left message informing him that a prescription has been e-scribed to [facility] for Zofran.

## 2016-01-20 ENCOUNTER — Telehealth: Payer: Self-pay | Admitting: *Deleted

## 2016-01-20 NOTE — Telephone Encounter (Signed)
Called [facility] [phone] for prior auth for Zofran ODT. Approved thru 01/2017. Patient notified and [facility] called to get medication ready for p/u

## 2016-01-21 ENCOUNTER — Encounter: Payer: Self-pay | Admitting: Oncology

## 2016-01-24 ENCOUNTER — Other Ambulatory Visit: Payer: Self-pay | Admitting: *Deleted

## 2016-01-24 DIAGNOSIS — N2889 Other specified disorders of kidney and ureter: Secondary | ICD-10-CM

## 2016-01-24 MED ORDER — PAZOPANIB HCL 200 MG PO TABS: 800.0000 mg | ORAL_TABLET | Freq: Every day | ORAL | 0 refills | Status: DC

## 2016-02-18 ENCOUNTER — Other Ambulatory Visit: Payer: Self-pay | Admitting: *Deleted

## 2016-02-18 DIAGNOSIS — C7931 Secondary malignant neoplasm of brain: Secondary | ICD-10-CM

## 2016-02-19 ENCOUNTER — Ambulatory Visit (HOSPITAL_BASED_OUTPATIENT_CLINIC_OR_DEPARTMENT_OTHER): Payer: Medicare HMO | Admitting: Oncology

## 2016-02-19 ENCOUNTER — Other Ambulatory Visit (HOSPITAL_BASED_OUTPATIENT_CLINIC_OR_DEPARTMENT_OTHER): Payer: Medicare HMO

## 2016-02-19 ENCOUNTER — Telehealth: Payer: Self-pay | Admitting: Oncology

## 2016-02-19 VITALS — BP 114/58 | HR 88 | Temp 98.1°F | Resp 18 | Ht 72.0 in | Wt 164.7 lb

## 2016-02-19 DIAGNOSIS — R270 Ataxia, unspecified: Secondary | ICD-10-CM

## 2016-02-19 DIAGNOSIS — R1013 Epigastric pain: Secondary | ICD-10-CM | POA: Diagnosis not present

## 2016-02-19 DIAGNOSIS — R251 Tremor, unspecified: Secondary | ICD-10-CM

## 2016-02-19 DIAGNOSIS — C642 Malignant neoplasm of left kidney, except renal pelvis: Secondary | ICD-10-CM | POA: Diagnosis not present

## 2016-02-19 DIAGNOSIS — C7931 Secondary malignant neoplasm of brain: Secondary | ICD-10-CM

## 2016-02-19 DIAGNOSIS — M791 Myalgia: Secondary | ICD-10-CM

## 2016-02-19 DIAGNOSIS — R110 Nausea: Secondary | ICD-10-CM | POA: Diagnosis not present

## 2016-02-19 DIAGNOSIS — R918 Other nonspecific abnormal finding of lung field: Secondary | ICD-10-CM

## 2016-02-19 DIAGNOSIS — C649 Malignant neoplasm of unspecified kidney, except renal pelvis: Secondary | ICD-10-CM

## 2016-02-19 DIAGNOSIS — R591 Generalized enlarged lymph nodes: Secondary | ICD-10-CM

## 2016-02-19 LAB — COMPREHENSIVE METABOLIC PANEL
ALBUMIN: 2.8 g/dL — AB (ref 3.5–5.0)
ALK PHOS: 82 U/L (ref 40–150)
ALT: 12 U/L (ref 0–55)
AST: 19 U/L (ref 5–34)
Anion Gap: 11 mEq/L (ref 3–11)
BILIRUBIN TOTAL: 0.39 mg/dL (ref 0.20–1.20)
BUN: 17.7 mg/dL (ref 7.0–26.0)
CO2: 25 mEq/L (ref 22–29)
Calcium: 9.7 mg/dL (ref 8.4–10.4)
Chloride: 98 mEq/L (ref 98–109)
Creatinine: 0.7 mg/dL (ref 0.7–1.3)
EGFR: >90 mL/min/{1.73_m2} (ref >90)
GLUCOSE: 88 mg/dL (ref 70–140)
Potassium: 4.8 mEq/L (ref 3.5–5.1)
SODIUM: 134 meq/L — AB (ref 136–145)
TOTAL PROTEIN: 7.3 g/dL (ref 6.4–8.3)

## 2016-02-19 LAB — CBC WITH DIFFERENTIAL/PLATELET
BASO%: 0.4 % (ref 0.0–2.0)
Basophils Absolute: 0 10*3/uL (ref 0.0–0.1)
EOS ABS: 0 10*3/uL (ref 0.0–0.5)
EOS%: 0.9 % (ref 0.0–7.0)
HCT: 35.1 % — ABNORMAL LOW (ref 38.4–49.9)
HEMOGLOBIN: 11.3 g/dL — AB (ref 13.0–17.1)
LYMPH%: 20 % (ref 14.0–49.0)
MCH: 31.7 pg (ref 27.2–33.4)
MCHC: 32.2 g/dL (ref 32.0–36.0)
MCV: 98.3 fL — ABNORMAL HIGH (ref 79.3–98.0)
MONO#: 0.5 10*3/uL (ref 0.1–0.9)
MONO%: 11.7 % (ref 0.0–14.0)
NEUT%: 67 % (ref 39.0–75.0)
NEUTROS ABS: 3.1 10*3/uL (ref 1.5–6.5)
Platelets: 315 10*3/uL (ref 140–400)
RBC: 3.57 10*6/uL — ABNORMAL LOW (ref 4.20–5.82)
RDW: 21 % — AB (ref 11.0–14.6)
WBC: 4.6 10*3/uL (ref 4.0–10.3)
lymph#: 0.9 10*3/uL (ref 0.9–3.3)

## 2016-02-19 MED ORDER — ONDANSETRON 8 MG PO TBDP: 8.0000 mg | ORAL_TABLET | Freq: Three times a day (TID) | ORAL | 1 refills | Status: DC | PRN

## 2016-02-19 MED ORDER — OXYCODONE HCL 5 MG PO TABS: 5.0000 mg | ORAL_TABLET | ORAL | 0 refills | Status: DC | PRN

## 2016-02-19 NOTE — Telephone Encounter (Signed)
Gave patient avs report and appointments for February. [facility] closed due to inclement weather will call later - patient aware.

## 2016-02-19 NOTE — Progress Notes (Signed)
Hematology and Oncology Follow Up Visit  Jacob Beck [id number] 12-08-1950 66 y.o. 02/19/2016 9:17 AM   Principle Diagnosis: 66 year old gentleman with renal cell carcinoma diagnosed in April 2016. He presented with a large tumor on the left kidney measuring 15 cm and extending into the retroperitoneum causing proximal renal vein thrombosis.  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   Prior Therapy:  Attempted surgical resection on 06/01/2014 that was unsuccessful given the size of tumor. Whole brain radiation therapy completed on 10/04/2015.  Current therapy:   Votrient 800 mg daily started on 06/07/2014. He is currently receiving 600 mg daily for better tolerance.   Interim History: Jacob Beck presents today for a follow-up visit with his wife. Since the last visit, he reports changes in his health. He continues to have periodic tremors but no dizziness or syncope. Despite that, he remains ambulatory but did have 1 fall but was minor and did not have any injury.Marland Kitchen He denied any fractures. His appetite remains erratic in nature. He is reporting some GI symptoms includes dyspepsia, nausea and epigastric abdominal pain after eating. He denied any vomiting or hematochezia or melena.   He continues to take Votrient without any major complications at this time. He tolerated 600 mg dosing without complaints. He denied any hemoptysis or out of control blood pressure issues. He continues to use oxycodone periodically to control his pain. His pain is predominantly abdominal and flank pain at times.   He does not report any fevers, chills or sweats. He does not report any chest pain, palpitation, orthopnea, leg edema. He does not report any cough, hemoptysis, hematemesis. He does not report any early satiety or abdominal distention. He does not report any skeletal pains to his back pain shoulder pain or hip pain. He does not report any lymphadenopathy or petechiae. Remainder of review of  systems unremarkable.   Medications: I have reviewed the patient's current medications.  Current Outpatient Prescriptions  Medication Sig Dispense Refill  . Multiple Vitamins-Minerals (MULTIVITAMIN WITH MINERALS) tablet Take 1 tablet by mouth daily.    Marland Kitchen omeprazole (PRILOSEC OTC) 20 MG tablet Take 20 mg by mouth daily.    . ondansetron (ZOFRAN ODT) 8 MG disintegrating tablet Take 1 tablet (8 mg total) by mouth every 8 (eight) hours as needed for nausea or vomiting. 30 tablet 1  . oxyCODONE (OXY IR/ROXICODONE) 5 MG immediate release tablet Take 1 tablet (5 mg total) by mouth every 4 (four) hours as needed for severe pain. 90 tablet 0  . pazopanib (VOTRIENT) 200 MG tablet Take 4 tablets (800 mg total) by mouth daily. TAKE 4 TABLETS (800MG ) ORALLY ONCE DAILY ON AN EMPTY STOMACH (AT LEAST1 HOUR BEFORE OR 2 HOURS AFTER A MEAL). SWALLOW WHOLE. DO NOT CRUSH. 120 tablet 0  . temazepam (RESTORIL) 30 MG capsule Take 1 capsule (30 mg total) by mouth at bedtime as needed for sleep. 30 capsule 1   No current facility-administered medications for this visit.      Allergies:  No Known Allergies  Past Medical History, Surgical history, Social history, and Family History were reviewed and updated.   Physical Exam: Blood pressure (!) 114/58, pulse 88, temperature 98.1 F (36.7 C), temperature source Oral, resp. rate 18, height 6' (1.829 m), weight 164 lb 11.2 oz (74.7 kg), SpO2 98 %. ECOG: 1 General appearance: Alert, awake gentleman without distress. Head: Normocephalic, without obvious abnormality. No oral thrush noted. Neck: no adenopathy no thyroid masses. Lymph nodes: Cervical, supraclavicular, and axillary nodes  normal. Heart:regular rate and rhythm, S1, S2 normal, no murmur, click, rub or gallop Lung:chest clear, no wheezing, rales, normal symmetric air entry Abdomin: soft, non-tender, without masses or organomegaly no rebound or guarding. EXT:no erythema, induration, or nodules Neurological  examination: No deficits noted. No difficulties with ambulation. He continues to have resting tremor.   Lab Results: Lab Results  Component Value Date   WBC 4.6 02/19/2016   HGB 11.3 (L) 02/19/2016   HCT 35.1 (L) 02/19/2016   MCV 98.3 (H) 02/19/2016   PLT 315 02/19/2016     Chemistry      Component Value Date/Time   NA 139 01/14/2016 1422   K 4.5 01/14/2016 1422   CL 98 (L) 01/07/2016 1510   CO2 28 01/14/2016 1422   BUN 20.8 01/14/2016 1422   CREATININE 0.8 01/14/2016 1422      Component Value Date/Time   CALCIUM 10.4 01/14/2016 1422   ALKPHOS 107 01/14/2016 1422   AST 14 01/14/2016 1422   ALT 16 01/14/2016 1422   BILITOT 0.55 01/14/2016 806      66 year old gentleman with the following issues:  1.Renal cell carcinoma presented with a large mass measuring 15.0 x 14.1 x 9.9 cm left renal mass extending off the upper pole of the left kidney into the retroperitoneum. He has also evidence of lymphadenopathy and small pulmonary nodules. He presented with abdominal pain and 20 pound weight loss. This is a biopsy proven to be renal cell carcinoma with a biopsy done on 05/04/2014. Attempted surgical resection for debulking purposes was unsuccessful.  He is currently on Votrient 600 mg daily.   CT scan on 01/14/2016  showed no evidence of systemic metastasis. He continues to have residual left renal mass at this time.  She denies any major complications related to Votrient since the last visit. The plan is to continue the same dose and schedule.  2. Brain metastasis: Detected on MRI on 09/05/2015. He completed whole brain radiation and repeat imaging studies in the future will determine any other intervention needed.   Repeat MRI will be needed in the future to follow-up on his brain metastasis.  3. Diffuse body pain: She is currently on oxycodone which helped his body pain. Was refilled today.  4. Nausea, dyspepsia and epigastric abdominal pain: Unclear etiology it could be  related to Votrient and his renal cell cancer. Other etiologies include peptic ulcer disease or GERD could be contributing. He is describing symptoms also painful swallowing which could be concerning. I recommended endoscopic evaluation after gastroenterology evaluation in the near future. I'll make the appropriate referral for him today.  5. Tremors and unsteadiness with ataxia: This could be exacerbated by his alcohol consumption appears to be stable at this time.  6. Liver function surveillance: No abnormalities in his liver function test on 01/14/2016.  7. Prognosis: He has an incurable malignancy and any treatment is palliative in nature at this time. His performance status remains reasonable to continue current palliative approach.  8. Follow-up: Will be in 4 weeks.    [facility], MD 1/17/20189:17 AM

## 2016-02-21 ENCOUNTER — Telehealth: Payer: Self-pay | Admitting: Medical Oncology

## 2016-02-21 ENCOUNTER — Other Ambulatory Visit: Payer: Self-pay | Admitting: *Deleted

## 2016-02-21 DIAGNOSIS — N2889 Other specified disorders of kidney and ureter: Secondary | ICD-10-CM

## 2016-02-21 MED ORDER — PAZOPANIB HCL 200 MG PO TABS: 600.0000 mg | ORAL_TABLET | Freq: Every day | ORAL | 0 refills | Status: DC

## 2016-02-21 MED ORDER — PAZOPANIB HCL 200 MG PO TABS: ORAL_TABLET | ORAL | 0 refills | Status: DC

## 2016-02-21 NOTE — Telephone Encounter (Signed)
Radiology ordered corrected

## 2016-02-24 ENCOUNTER — Telehealth: Payer: Self-pay | Admitting: Oncology

## 2016-02-24 ENCOUNTER — Encounter: Payer: Self-pay | Admitting: Oncology

## 2016-02-24 NOTE — Telephone Encounter (Signed)
Patient's wife said she called and left a message on Friday and noone called her back and she needs to talk to Dr Hazeline Junker nurse. Her call back number is [phone]

## 2016-02-25 ENCOUNTER — Telehealth: Payer: Self-pay | Admitting: Oncology

## 2016-02-25 NOTE — Telephone Encounter (Signed)
Patients wife called and said she has left multiple messages Friday, Monday and Today and checked mychart and there is no notes she would like for someone to call her back today  [phone]

## 2016-02-26 ENCOUNTER — Telehealth: Payer: Self-pay | Admitting: Oncology

## 2016-02-26 NOTE — Telephone Encounter (Signed)
FAXED RECORDS TO [facility]

## 2016-02-26 NOTE — Telephone Encounter (Signed)
Spoke with both patient and wife re appointment with Dr. Watt Climes at [facility] 03/04/16 @ 10 am. Appointment scheduled with Jacob Beck.  Fax cover sheet and referral sent to HIM to fax notes to [facility] at [phone].

## 2016-02-27 ENCOUNTER — Other Ambulatory Visit: Payer: Self-pay | Admitting: *Deleted

## 2016-02-27 MED ORDER — TEMAZEPAM 30 MG PO CAPS: 30.0000 mg | ORAL_CAPSULE | Freq: Every evening | ORAL | 1 refills | Status: DC | PRN

## 2016-03-04 ENCOUNTER — Encounter: Payer: Self-pay | Admitting: *Deleted

## 2016-03-04 DIAGNOSIS — K219 Gastro-esophageal reflux disease without esophagitis: Secondary | ICD-10-CM | POA: Diagnosis not present

## 2016-03-04 DIAGNOSIS — K210 Gastro-esophageal reflux disease with esophagitis: Secondary | ICD-10-CM | POA: Diagnosis not present

## 2016-03-04 DIAGNOSIS — K269 Duodenal ulcer, unspecified as acute or chronic, without hemorrhage or perforation: Secondary | ICD-10-CM | POA: Diagnosis not present

## 2016-03-04 DIAGNOSIS — R933 Abnormal findings on diagnostic imaging of other parts of digestive tract: Secondary | ICD-10-CM | POA: Diagnosis not present

## 2016-03-05 ENCOUNTER — Other Ambulatory Visit: Payer: Self-pay | Admitting: Oncology

## 2016-03-05 MED ORDER — OXYCODONE HCL 5 MG PO TABS: 10.0000 mg | ORAL_TABLET | Freq: Four times a day (QID) | ORAL | 0 refills | Status: DC | PRN

## 2016-03-10 DIAGNOSIS — K219 Gastro-esophageal reflux disease without esophagitis: Secondary | ICD-10-CM | POA: Diagnosis not present

## 2016-03-16 ENCOUNTER — Encounter: Payer: Self-pay | Admitting: *Deleted

## 2016-03-17 ENCOUNTER — Other Ambulatory Visit: Payer: Self-pay | Admitting: *Deleted

## 2016-03-17 DIAGNOSIS — N2889 Other specified disorders of kidney and ureter: Secondary | ICD-10-CM

## 2016-03-17 MED ORDER — PAZOPANIB HCL 200 MG PO TABS: ORAL_TABLET | ORAL | 0 refills | Status: DC

## 2016-03-23 ENCOUNTER — Telehealth: Payer: Self-pay | Admitting: *Deleted

## 2016-03-23 ENCOUNTER — Other Ambulatory Visit: Payer: Self-pay | Admitting: Oncology

## 2016-03-23 MED ORDER — OXYCODONE HCL 5 MG PO TABS: 10.0000 mg | ORAL_TABLET | Freq: Four times a day (QID) | ORAL | 0 refills | Status: DC | PRN

## 2016-03-23 NOTE — Telephone Encounter (Signed)
Called patient and left a message informing him that his prescription is ready for pick up.

## 2016-03-25 DIAGNOSIS — R933 Abnormal findings on diagnostic imaging of other parts of digestive tract: Secondary | ICD-10-CM | POA: Diagnosis not present

## 2016-03-25 DIAGNOSIS — K210 Gastro-esophageal reflux disease with esophagitis: Secondary | ICD-10-CM | POA: Diagnosis not present

## 2016-03-26 ENCOUNTER — Ambulatory Visit (HOSPITAL_BASED_OUTPATIENT_CLINIC_OR_DEPARTMENT_OTHER): Payer: Medicare HMO

## 2016-03-26 ENCOUNTER — Ambulatory Visit (HOSPITAL_BASED_OUTPATIENT_CLINIC_OR_DEPARTMENT_OTHER): Payer: Medicare HMO | Admitting: Oncology

## 2016-03-26 ENCOUNTER — Other Ambulatory Visit (HOSPITAL_BASED_OUTPATIENT_CLINIC_OR_DEPARTMENT_OTHER): Payer: Medicare HMO

## 2016-03-26 ENCOUNTER — Telehealth: Payer: Self-pay | Admitting: *Deleted

## 2016-03-26 ENCOUNTER — Telehealth: Payer: Self-pay | Admitting: Oncology

## 2016-03-26 VITALS — BP 122/80 | HR 106 | Temp 98.6°F | Resp 16 | Ht 72.0 in | Wt 169.7 lb

## 2016-03-26 DIAGNOSIS — R591 Generalized enlarged lymph nodes: Secondary | ICD-10-CM

## 2016-03-26 DIAGNOSIS — C649 Malignant neoplasm of unspecified kidney, except renal pelvis: Secondary | ICD-10-CM

## 2016-03-26 DIAGNOSIS — C7931 Secondary malignant neoplasm of brain: Secondary | ICD-10-CM

## 2016-03-26 DIAGNOSIS — R270 Ataxia, unspecified: Secondary | ICD-10-CM

## 2016-03-26 DIAGNOSIS — M791 Myalgia: Secondary | ICD-10-CM

## 2016-03-26 DIAGNOSIS — C642 Malignant neoplasm of left kidney, except renal pelvis: Secondary | ICD-10-CM

## 2016-03-26 DIAGNOSIS — R251 Tremor, unspecified: Secondary | ICD-10-CM | POA: Diagnosis not present

## 2016-03-26 DIAGNOSIS — R918 Other nonspecific abnormal finding of lung field: Secondary | ICD-10-CM | POA: Diagnosis not present

## 2016-03-26 LAB — CBC WITH DIFFERENTIAL/PLATELET
BASO%: 0.6 % (ref 0.0–2.0)
Basophils Absolute: 0 10*3/uL (ref 0.0–0.1)
EOS ABS: 0 10*3/uL (ref 0.0–0.5)
EOS%: 0.9 % (ref 0.0–7.0)
HCT: 35.3 % — ABNORMAL LOW (ref 38.4–49.9)
HEMOGLOBIN: 11.5 g/dL — AB (ref 13.0–17.1)
LYMPH#: 0.8 10*3/uL — AB (ref 0.9–3.3)
LYMPH%: 16 % (ref 14.0–49.0)
MCH: 30.9 pg (ref 27.2–33.4)
MCHC: 32.5 g/dL (ref 32.0–36.0)
MCV: 95.1 fL (ref 79.3–98.0)
MONO#: 0.6 10*3/uL (ref 0.1–0.9)
MONO%: 11.3 % (ref 0.0–14.0)
NEUT%: 71.2 % (ref 39.0–75.0)
NEUTROS ABS: 3.5 10*3/uL (ref 1.5–6.5)
PLATELETS: 352 10*3/uL (ref 140–400)
RBC: 3.71 10*6/uL — AB (ref 4.20–5.82)
RDW: 21 % — AB (ref 11.0–14.6)
WBC: 5 10*3/uL (ref 4.0–10.3)

## 2016-03-26 LAB — COMPREHENSIVE METABOLIC PANEL
ALBUMIN: 2.9 g/dL — AB (ref 3.5–5.0)
ALK PHOS: 101 U/L (ref 40–150)
ALT: 9 U/L (ref 0–55)
ANION GAP: 11 meq/L (ref 3–11)
AST: 12 U/L (ref 5–34)
BILIRUBIN TOTAL: 0.39 mg/dL (ref 0.20–1.20)
BUN: 16.6 mg/dL (ref 7.0–26.0)
CO2: 29 meq/L (ref 22–29)
CREATININE: 0.7 mg/dL (ref 0.7–1.3)
Calcium: 11.1 mg/dL — ABNORMAL HIGH (ref 8.4–10.4)
Chloride: 97 mEq/L — ABNORMAL LOW (ref 98–109)
EGFR: >90 mL/min/{1.73_m2} (ref >90)
GLUCOSE: 103 mg/dL (ref 70–140)
Potassium: 5 mEq/L (ref 3.5–5.1)
Sodium: 138 mEq/L (ref 136–145)
Total Protein: 8.2 g/dL (ref 6.4–8.3)

## 2016-03-26 MED ORDER — ZOLEDRONIC ACID 4 MG/100ML IV SOLN
4.0000 mg | Freq: Once | INTRAVENOUS | Status: AC
  Administered 2016-03-26: 4 mg via INTRAVENOUS
  Filled 2016-03-26: qty 100

## 2016-03-26 MED ORDER — SODIUM CHLORIDE 0.9 % IV SOLN
Freq: Once | INTRAVENOUS | Status: AC
  Administered 2016-03-26: 15:00:00 via INTRAVENOUS

## 2016-03-26 NOTE — Telephone Encounter (Signed)
-----   Message from Wyatt Portela, MD sent at 03/26/2016 10:23 AM EST ----- His calcium is 11.1 which little high and needs a Zometa infusion. Treating his calcium can help his tremors.   Can you let him he needs to come back on 2/23 for the infusion if infusion can do him.   Thanks,   FS

## 2016-03-26 NOTE — Progress Notes (Signed)
Hematology and Oncology Follow Up Visit  Jacob Beck [id number] 11-08-1950 66 y.o. 03/26/2016 10:16 AM   Principle Diagnosis: 66 year old gentleman with renal cell carcinoma diagnosed in April 2016. He presented with a large tumor on the left kidney measuring 15 cm and extending into the retroperitoneum causing proximal renal vein thrombosis.  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   Prior Therapy:  Attempted surgical resection on 06/01/2014 that was unsuccessful given the size of tumor. Whole brain radiation therapy completed on 10/04/2015.  Current therapy:   Votrient 800 mg daily started on 06/07/2014. He is currently receiving 600 mg daily for better tolerance.   Interim History: Jacob Beck presents today for a follow-up visit with his wife. Since the last visit, he reports doing well for the most part and reports he had an excellent day yesterday. He was able to ambulate without difficulties and has not reported any falls or syncope. His appetite has been better and have gained 4 pounds. He is feeling less energetic today and slightly lightheaded overall.  He continues to have periodic tremors but no dizziness or syncope. He denied any fractures. He reports his symptoms of dyspepsia and regurgitation has improved some after starting proton pump inhibitors and probiotics. He also had an endoscopy which did not show any clear-cut abnormalities.  He continues to take Votrient without any major complications at this time. He tolerated 600 mg dosing without complaints. He denied any hemoptysis or out of control blood pressure issues. His pain is reasonably controlled on oxycodone.   He does not report any fevers, chills or sweats. He does not report any chest pain, palpitation, orthopnea, leg edema. He does not report any cough, hemoptysis, hematemesis. He does not report any early satiety or abdominal distention. He does not report any skeletal pains to his back pain shoulder pain  or hip pain. He does not report any lymphadenopathy or petechiae. Remainder of review of systems unremarkable.   Medications: I have reviewed the patient's current medications.  Current Outpatient Prescriptions  Medication Sig Dispense Refill  . Multiple Vitamins-Minerals (MULTIVITAMIN WITH MINERALS) tablet Take 1 tablet by mouth daily.    Marland Kitchen oxyCODONE (OXY IR/ROXICODONE) 5 MG immediate release tablet Take 2 tablets (10 mg total) by mouth every 6 (six) hours as needed for severe pain. 90 tablet 0  . pantoprazole (PROTONIX) 40 MG tablet Take 1 tablet by mouth daily.    . pazopanib (VOTRIENT) 200 MG tablet TAKE 3 TABLETS (600MG ) ORALLY ONCE DAILY ON AN EMPTY STOMACH (AT LEAST1 HOUR BEFORE OR 2 HOURS AFTER A MEAL). SWALLOW WHOLE. DO NOT CRUSH. 90 tablet 0  . Probiotic Product (PROBIOTIC DAILY PO) Take 1 tablet by mouth daily.    . temazepam (RESTORIL) 30 MG capsule Take 1 capsule (30 mg total) by mouth at bedtime as needed for sleep. 30 capsule 1   No current facility-administered medications for this visit.      Allergies:  No Known Allergies  Past Medical History, Surgical history, Social history, and Family History were reviewed and updated.   Physical Exam: Blood pressure 122/80, pulse (!) 106, temperature 98.6 F (37 C), temperature source Oral, resp. rate 16, height 6' (1.829 m), weight 169 lb 11.2 oz (77 kg), SpO2 99 %. ECOG: 1 General appearance: Well-appearing gentleman without distress. Head: Normocephalic, without obvious abnormality. No oral ulcers or lesions. Neck: no adenopathy no thyroid masses. Lymph nodes: Cervical, supraclavicular, and axillary nodes normal. Heart:regular rate and rhythm, S1, S2 normal, no murmur, click,  rub or gallop Lung:chest clear, no wheezing, rales, normal symmetric air entry Abdomin: soft, non-tender, without masses or organomegaly no rebound or guarding. EXT:no erythema, induration, or nodules Neurological examination: No deficits noted.  Resting tremors continue to be noticeable.   Lab Results: Lab Results  Component Value Date   WBC 5.0 03/26/2016   HGB 11.5 (L) 03/26/2016   HCT 35.3 (L) 03/26/2016   MCV 95.1 03/26/2016   PLT 352 03/26/2016     Chemistry      Component Value Date/Time   NA 138 03/26/2016 0834   K 5.0 03/26/2016 0834   CL 98 (L) 01/07/2016 1510   CO2 29 03/26/2016 0834   BUN 16.6 03/26/2016 0834   CREATININE 0.7 03/26/2016 0834      Component Value Date/Time   CALCIUM 11.1 (H) 03/26/2016 0834   ALKPHOS 101 03/26/2016 0834   AST 12 03/26/2016 0834   ALT 9 03/26/2016 0834   BILITOT 0.39 03/26/2016 2938      66 year old gentleman with the following issues:  1.Renal cell carcinoma presented with a large mass measuring 15.0 x 14.1 x 9.9 cm left renal mass extending off the upper pole of the left kidney into the retroperitoneum. He has also evidence of lymphadenopathy and small pulmonary nodules. He presented with abdominal pain and 20 pound weight loss. This is a biopsy proven to be renal cell carcinoma with a biopsy done on 05/04/2014. Attempted surgical resection for debulking purposes was unsuccessful.  He is currently on Votrient 600 mg daily.   CT scan on 01/14/2016  showed no evidence of systemic metastasis. He continues to have residual left renal mass at this time.  The plan is to continue the same dose and schedule for treatment and repeat imaging studies in March 2018.  The patient continues to inquire about the possibility of primary surgical therapy for his tumor. I do not feel this is his best option given his brain metastasis. I do not rule this option not completely but I feel it is less likely.  2. Brain metastasis: Detected on MRI on 09/05/2015. He completed whole brain radiation and repeat imaging studies in the future will determine any other intervention needed.   Repeat MRI will be needed in the future to follow-up on his brain metastasis.  3. Diffuse body pain: She is  currently on oxycodone which helped his body pain. This remains to be controlled.  4. Nausea, dyspepsia and epigastric abdominal pain: Improved after endoscopy and probiotic treatments.  5. Tremors and unsteadiness with ataxia: This could be exacerbated by his alcohol consumption appears to be stable at this time.  6. Liver function surveillance: Continues to be within normal range.  7. Prognosis: This was addressed again today. I  emphasized that any treatment at this time is palliative given his metastatic disease especially to the brain. He is still disappointed about not able to have his primary tumor removed. I do not think this is feasible at any point in the future. I told him that I will not rule this option not completely but continues to be extremely unlikely.  8. Hypercalcemia: His calcium slightly elevated at this time. This could be exacerbating his tremors and that was the first time it had been reported as high. I will arrange for Zometa infusion in the next 24 hours to treat his hypercalcemia.  9. Follow-up: Will be in 4 weeks.    [facility], MD 2/22/201810:16 AM

## 2016-03-26 NOTE — Patient Instructions (Signed)

## 2016-03-26 NOTE — Telephone Encounter (Signed)
As noted below by Dr. Alen Blew, I informed wife of patient's calcium level. Patient is coming back at 2:30 pm today for a Zometa infusion. Wife and patient verbalized understanding.

## 2016-03-26 NOTE — Telephone Encounter (Signed)
Gave patient avs report and appointments for March. [facility] radiology will call re scan.  °

## 2016-03-27 ENCOUNTER — Observation Stay (HOSPITAL_COMMUNITY)
Admission: EM | Admit: 2016-03-27 | Discharge: 2016-03-29 | Disposition: A | Discharge status: Home / Self Care | Payer: Medicare HMO | Attending: Internal Medicine | Admitting: Internal Medicine

## 2016-03-27 ENCOUNTER — Other Ambulatory Visit (HOSPITAL_COMMUNITY): Payer: Self-pay

## 2016-03-27 ENCOUNTER — Encounter (HOSPITAL_COMMUNITY): Payer: Self-pay | Admitting: Emergency Medicine

## 2016-03-27 ENCOUNTER — Emergency Department (HOSPITAL_COMMUNITY): Payer: Medicare HMO

## 2016-03-27 ENCOUNTER — Other Ambulatory Visit: Payer: Self-pay

## 2016-03-27 ENCOUNTER — Telehealth: Payer: Self-pay | Admitting: *Deleted

## 2016-03-27 DIAGNOSIS — Z85528 Personal history of other malignant neoplasm of kidney: Secondary | ICD-10-CM | POA: Insufficient documentation

## 2016-03-27 DIAGNOSIS — K219 Gastro-esophageal reflux disease without esophagitis: Secondary | ICD-10-CM

## 2016-03-27 DIAGNOSIS — Z85841 Personal history of malignant neoplasm of brain: Secondary | ICD-10-CM | POA: Insufficient documentation

## 2016-03-27 DIAGNOSIS — C7931 Secondary malignant neoplasm of brain: Secondary | ICD-10-CM | POA: Diagnosis present

## 2016-03-27 DIAGNOSIS — R509 Fever, unspecified: Secondary | ICD-10-CM | POA: Diagnosis present

## 2016-03-27 DIAGNOSIS — R531 Weakness: Secondary | ICD-10-CM | POA: Diagnosis not present

## 2016-03-27 DIAGNOSIS — C649 Malignant neoplasm of unspecified kidney, except renal pelvis: Secondary | ICD-10-CM | POA: Diagnosis present

## 2016-03-27 DIAGNOSIS — Z87891 Personal history of nicotine dependence: Secondary | ICD-10-CM | POA: Insufficient documentation

## 2016-03-27 DIAGNOSIS — J189 Pneumonia, unspecified organism: Principal | ICD-10-CM

## 2016-03-27 DIAGNOSIS — R404 Transient alteration of awareness: Secondary | ICD-10-CM | POA: Diagnosis not present

## 2016-03-27 HISTORY — DX: Malignant neoplasm of left kidney, except renal pelvis: C64.2

## 2016-03-27 HISTORY — DX: Pneumonia, unspecified organism: J18.9

## 2016-03-27 HISTORY — DX: Secondary malignant neoplasm of brain: C79.31

## 2016-03-27 LAB — URINALYSIS, ROUTINE W REFLEX MICROSCOPIC
Bilirubin Urine: NEGATIVE
Glucose, UA: NEGATIVE mg/dL
Hgb urine dipstick: NEGATIVE
Ketones, ur: NEGATIVE mg/dL
Leukocytes, UA: NEGATIVE
Nitrite: NEGATIVE
Protein, ur: 30 mg/dL — AB
Specific Gravity, Urine: 1.016 (ref 1.005–1.030)
Squamous Epithelial / LPF: NONE SEEN
pH: 5 (ref 5.0–8.0)

## 2016-03-27 LAB — INFLUENZA PANEL BY PCR (TYPE A & B)
Influenza A By PCR: NEGATIVE
Influenza B By PCR: NEGATIVE

## 2016-03-27 LAB — CBC WITH DIFFERENTIAL/PLATELET
Basophils Absolute: 0 10*3/uL (ref 0.0–0.1)
Basophils Relative: 0 %
Eosinophils Absolute: 0 10*3/uL (ref 0.0–0.7)
Eosinophils Relative: 0 %
HCT: 35.1 % — ABNORMAL LOW (ref 39.0–52.0)
Hemoglobin: 10.9 g/dL — ABNORMAL LOW (ref 13.0–17.0)
Lymphocytes Relative: 2 %
Lymphs Abs: 0.2 10*3/uL — ABNORMAL LOW (ref 0.7–4.0)
MCH: 29.9 pg (ref 26.0–34.0)
MCHC: 31.1 g/dL (ref 30.0–36.0)
MCV: 96.2 fL (ref 78.0–100.0)
Monocytes Absolute: 0.2 10*3/uL (ref 0.1–1.0)
Monocytes Relative: 2 %
Neutro Abs: 9.4 10*3/uL — ABNORMAL HIGH (ref 1.7–7.7)
Neutrophils Relative %: 96 %
Platelets: 301 10*3/uL (ref 150–400)
RBC: 3.65 MIL/uL — ABNORMAL LOW (ref 4.22–5.81)
RDW: 19.3 % — ABNORMAL HIGH (ref 11.5–15.5)
WBC: 9.8 10*3/uL (ref 4.0–10.5)

## 2016-03-27 LAB — STREP PNEUMONIAE URINARY ANTIGEN: Strep Pneumo Urinary Antigen: NEGATIVE

## 2016-03-27 LAB — BASIC METABOLIC PANEL
Anion gap: 11 (ref 5–15)
BUN: 16 mg/dL (ref 6–20)
CO2: 27 mmol/L (ref 22–32)
Calcium: 9.6 mg/dL (ref 8.9–10.3)
Chloride: 97 mmol/L — ABNORMAL LOW (ref 101–111)
Creatinine, Ser: 0.89 mg/dL (ref 0.61–1.24)
GFR calc Af Amer: >60 mL/min (ref >60)
GFR calc non Af Amer: >60 mL/min (ref >60)
Glucose, Bld: 133 mg/dL — ABNORMAL HIGH (ref 65–99)
Potassium: 4.4 mmol/L (ref 3.5–5.1)
Sodium: 135 mmol/L (ref 135–145)

## 2016-03-27 LAB — MRSA PCR SCREENING: MRSA by PCR: NEGATIVE

## 2016-03-27 LAB — PROCALCITONIN: Procalcitonin: 3.26 ng/mL

## 2016-03-27 LAB — LACTIC ACID, PLASMA
Lactic Acid, Venous: 1.3 mmol/L (ref 0.5–1.9)
Lactic Acid, Venous: 1.2 mmol/L (ref 0.5–1.9)

## 2016-03-27 MED ORDER — ACETAMINOPHEN 650 MG RE SUPP: 650.0000 mg | Freq: Four times a day (QID) | RECTAL | Status: DC | PRN

## 2016-03-27 MED ORDER — VANCOMYCIN HCL 10 G IV SOLR
1500.0000 mg | Freq: Once | INTRAVENOUS | Status: AC
  Administered 2016-03-27: 1500 mg via INTRAVENOUS
  Filled 2016-03-27: qty 1500

## 2016-03-27 MED ORDER — HEPARIN SODIUM (PORCINE) 5000 UNIT/ML IJ SOLN
5000.0000 [IU] | Freq: Three times a day (TID) | INTRAMUSCULAR | Status: DC
  Administered 2016-03-27 – 2016-03-29 (×6): 5000 [IU] via SUBCUTANEOUS
  Filled 2016-03-27 (×6): qty 1

## 2016-03-27 MED ORDER — IPRATROPIUM-ALBUTEROL 0.5-2.5 (3) MG/3ML IN SOLN: 3.0000 mL | Freq: Four times a day (QID) | RESPIRATORY_TRACT | Status: DC | PRN

## 2016-03-27 MED ORDER — SODIUM CHLORIDE 0.9 % IV BOLUS (SEPSIS)
2500.0000 mL | Freq: Once | INTRAVENOUS | Status: AC
  Administered 2016-03-27: 2500 mL via INTRAVENOUS

## 2016-03-27 MED ORDER — ACETAMINOPHEN 325 MG PO TABS
650.0000 mg | ORAL_TABLET | Freq: Once | ORAL | Status: AC
  Administered 2016-03-27: 650 mg via ORAL
  Filled 2016-03-27: qty 2

## 2016-03-27 MED ORDER — DEXTROSE 5 % IV SOLN
1.0000 g | Freq: Three times a day (TID) | INTRAVENOUS | Status: DC
  Administered 2016-03-27 – 2016-03-29 (×6): 1 g via INTRAVENOUS
  Filled 2016-03-27 (×7): qty 1

## 2016-03-27 MED ORDER — ORAL CARE MOUTH RINSE
15.0000 mL | Freq: Two times a day (BID) | OROMUCOSAL | Status: DC
  Administered 2016-03-27: 15 mL via OROMUCOSAL

## 2016-03-27 MED ORDER — ONDANSETRON HCL 4 MG/2ML IJ SOLN: 4.0000 mg | Freq: Four times a day (QID) | INTRAMUSCULAR | Status: DC | PRN

## 2016-03-27 MED ORDER — ONDANSETRON HCL 4 MG PO TABS: 4.0000 mg | ORAL_TABLET | Freq: Four times a day (QID) | ORAL | Status: DC | PRN

## 2016-03-27 MED ORDER — POLYETHYLENE GLYCOL 3350 17 G PO PACK: 17.0000 g | PACK | Freq: Every day | ORAL | Status: DC | PRN

## 2016-03-27 MED ORDER — SODIUM CHLORIDE 0.9 % IV SOLN
INTRAVENOUS | Status: DC
  Administered 2016-03-27 – 2016-03-28 (×3): via INTRAVENOUS

## 2016-03-27 MED ORDER — PIPERACILLIN-TAZOBACTAM 3.375 G IVPB 30 MIN
3.3750 g | Freq: Once | INTRAVENOUS | Status: AC
  Administered 2016-03-27: 3.375 g via INTRAVENOUS
  Filled 2016-03-27: qty 50

## 2016-03-27 MED ORDER — ACETAMINOPHEN 325 MG PO TABS
650.0000 mg | ORAL_TABLET | Freq: Four times a day (QID) | ORAL | Status: DC | PRN
  Administered 2016-03-27: 650 mg via ORAL
  Filled 2016-03-27: qty 2

## 2016-03-27 MED ORDER — TRAZODONE HCL 50 MG PO TABS
25.0000 mg | ORAL_TABLET | Freq: Every evening | ORAL | Status: DC | PRN
  Administered 2016-03-27: 25 mg via ORAL
  Filled 2016-03-27: qty 1

## 2016-03-27 NOTE — H&P (Signed)
History and Physical    [name] [id number] DOB: January 05, 1951 DOA: 03/27/2016  PCP: Gara Kroner, MD   Patient coming from: Home  Chief Complaint: generalized weakness, shaking chills, fever  HPI: Jacob Beck is a 66 y.o. male with medical history significant of renal carcinoma with brain metastases - currently on chemotherap (follod by dr. Alen Blew), GERD, who presented to the ED with c/o generalized priogressive weakness weakness, shaking chill and fever. Onset of symptoms earlier this morning, when patient woke up feeling weak and more shaky than usual. He has tremor, but this morning it was noticeably worse.   Patient's wife reported that two days ago he was able to woke in the park and was feeling well, has no contacts with sick persons. Patient denied myalgia, runny nose, cough. SOB, headache  ED Course: on arrival patient had mild fever iof 99.1 that increased to 103F, he was tachycardic with HR 134 and tachypneic with RR 31 and hypotensive with BP 91/56 mmHg Blood work was notable for mild ischemia with Hgb 10.9, normal WBC's count, normal renal function and electrolytes. Lactic acid was normal Chest Xray showed reduced lung volumes and was suspicious of bibasilar infiltrates EKG demonstrated TW abnormality that is not knew  Review of Systems: As per HPI otherwise 10 point review of systems negative.   Ambulatory Status: walks independently, but has to look down to avoid sensation of dizziness  Past Medical History:  Diagnosis Date  . GERD (gastroesophageal reflux disease)    occ  . left renal cell ca dx'd 04/2014   renal-left  . Renal mass     Past Surgical History:  Procedure Laterality Date  . FOOT SURGERY Right 2010   tendon donor  . INGUINAL HERNIA REPAIR Right 10/15/2014   Procedure: RIGHT INGUINAL HERNIA REPAIR WITH MESH;  Surgeon: Rolm Bookbinder, MD;  Location: [place];  Service: General;  Laterality: Right;  . INSERTION OF MESH Right 10/15/2014   Procedure: INSERTION OF MESH;  Surgeon: Rolm Bookbinder, MD;  Location: [place];  Service: General;  Laterality: Right;  . KIDNEY SURGERY Right 3/16   attempted removal of renal mass but decided to dangerous when opened abdomen    Social History   Social History  . Marital status: Married    Spouse name: N/A  . Number of children: N/A  . Years of education: N/A   Occupational History  . Not on file.   Social History Main Topics  . Smoking status: Former Smoker    Packs/day: 1.00    Years: 10.00    Types: Cigarettes    Quit date: 10/10/1999  . Smokeless tobacco: Never Used  . Alcohol use Yes     Comment: 2-4 beers per night  . Drug use: No  . Sexual activity: Not on file   Other Topics Concern  . Not on file   Social History Narrative  . No narrative on file    No Known Allergies  Family History  Problem Relation Age of Onset  . Prostate cancer Brother   . Heart failure Mother   . Diabetes Mother   . Diabetes Father     Prior to Admission medications   Medication Sig Start Date End Date Taking? Authorizing Provider  Multiple Vitamins-Minerals (MULTIVITAMIN WITH MINERALS) tablet Take 1 tablet by mouth daily.    Historical Provider, MD  oxyCODONE (OXY IR/ROXICODONE) 5 MG immediate release tablet Take 2 tablets (10 mg total) by mouth every 6 (six) hours as needed for  severe pain. 03/23/16   Wyatt Portela, MD  pantoprazole (PROTONIX) 40 MG tablet Take 1 tablet by mouth daily. 03/05/16   Historical Provider, MD  pazopanib (VOTRIENT) 200 MG tablet TAKE 3 TABLETS (600MG ) ORALLY ONCE DAILY ON AN EMPTY STOMACH (AT LEAST1 HOUR BEFORE OR 2 HOURS AFTER A MEAL). SWALLOW WHOLE. DO NOT CRUSH. 03/17/16   Wyatt Portela, MD  Probiotic Product (PROBIOTIC DAILY PO) Take 1 tablet by mouth daily. 03/26/16   Historical Provider, MD  temazepam (RESTORIL) 30 MG capsule Take 1 capsule (30 mg total) by mouth at bedtime as needed for sleep. 02/27/16   Wyatt Portela, MD    Physical Exam: Vitals:    03/27/16 1200 03/27/16 1216 03/27/16 1225 03/27/16 1230  BP: 93/59  102/69 105/70  Pulse: 94  102 99  Resp: 20  (!) 27 17  Temp:  99.1 F (37.3 C)    TempSrc:  Oral    SpO2: 100%  100% 99%  Weight:      Height:         General: Appears calm and comfortable Eyes: PERRLA, EOMI, normal lids, iris ENT:  grossly normal hearing, lips & tongue, mucous membranes moist and intact Neck: no lymphoadenopathy, masses or thyromegaly Cardiovascular: RRR, no m/r/g. No JVD, carotid bruits. No LE edema.  Respiratory: bilateral no wheezes, rales, rhonchi or crackles, diminished BS at the bases bilaterally. Normal respiratory effort. No accessory muscle use observed Abdomen: soft, non-tender, non-distended, no organomegaly or masses appreciated. BS present in all quadrants Skin: no rash, ulcers or induration seen on limited exam Musculoskeletal: grossly normal tone BUE/BLE, good ROM, no bony abnormality or joint deformities observed Psychiatric: grossly normal mood and affect, speech fluent and appropriate, alert and oriented x3 Neurologic: CN II-XII grossly intact, moves all extremities in coordinated fashion, sensation intact  Labs on Admission: I have personally reviewed following labs and imaging studies  CBC, BMP  GFR: Estimated Creatinine Clearance: 89.8 mL/min (by C-G formula based on SCr of 0.89 mg/dL).   Creatinine Clearance: Estimated Creatinine Clearance: 89.8 mL/min (by C-G formula based on SCr of 0.89 mg/dL).    Radiological Exams on Admission: Dg Chest Portable 1 View  Result Date: 03/27/2016 CLINICAL DATA:  Fever. EXAM: PORTABLE CHEST 1 VIEW COMPARISON:  CT 12/12 2017. FINDINGS: Mediastinum and hilar structures normal. Heart size normal. Low lung volumes Mild bibasilar infiltrates cannot be excluded. No pleural effusion or pneumothorax. No acute bony abnormality IMPRESSION: Low lung volumes.  Mild bibasilar infiltrates cannot be excluded . Electronically Signed   By: Marcello Moores   Register   On: 03/27/2016 08:22    EKG: Independently reviewed - Sinus tachycardia with ST wave abnormality, not knew  Assessment/Plan Principal Problem:   CAP (community acquired pneumonia) Active Problems:   Renal cell carcinoma ([place])   Brain metastasis ([facility])   Fever   GERD (gastroesophageal reflux disease)   Hypercalcemia    CAP - admitted with abrupt onset of fever and generalized weakness this morning Doubt sepsis, most likely SIRS, Lactic acid WNL, Influenza PCR was negative, will obtain procalcitonin Admit on IV antibiotic therapy with Maxipime and Vancomycin, monitor fever and WBC response Check MRSA PCR and if negative, discontinue Vancomycin Will start IS per respiratory therapy, nebulizer treatments prn  Renal cell carcinoma nwith brain metastases Continue chemotherapy with Votrient as at home Monitor for safety as patient has gait instability  GERD - stable, symptoms free Continue Protonix  Hypercalcemia Patient is now on Zometa and had an infusion yesterday  Monitor Calcium level   DVT prophylaxis: heparin  Code Status: full  Family Communication: at bedside, wife Disposition Plan: MedSurg Consults called: none Admission status: pbservation    York Grice, [place] Pager: [phone] [facility]  If 7PM-7AM, please contact night-coverage [email or web address] Password [email or web address]  03/27/2016, 1:07 PM

## 2016-03-27 NOTE — Telephone Encounter (Signed)
Spoke with patient's wife. Patient fell out of his recliner 6:30  am this morning. Had to have neighbor help get him up. He was unable to stand. parametics called. Taken to [place] is on iv antibiotics. Per Dr Alen Blew, hold votrient dose for now. Wife sharon verbalized understanding.

## 2016-03-27 NOTE — ED Provider Notes (Signed)
[facility] DEPT Provider Note   CSN: [id number] Arrival date & time: 03/27/16  [id number]     History   Chief Complaint Chief Complaint  Patient presents with  . Fever  . Weakness    HPI Jacob Beck is a 66 y.o. male.  HPI  66 y.o. male with medical history significant of renal carcinoma with brain metastases - currently on chemotherap (followed by dr. Alen Blew), GERD, who presented to the ED with c/o generalized progressive weakness weakness, shaking chills and fever. Onset of symptoms earlier this morning, when patient woke up feeling weak and more shaky than usual. He has tremor, but this morning it was noticeably worse.   Patient's wife reported that two days ago he was able to walk. No sick contacts.   Past Medical History:  Diagnosis Date  . GERD (gastroesophageal reflux disease)    occ  . left renal cell ca dx'd 04/2014   renal-left  . Renal mass     Patient Active Problem List   Diagnosis Date Noted  . Brain metastasis ([place]) 10/04/2015  . Renal cell carcinoma ([place]) 09/18/2015  . Renal mass   . Left renal mass     Past Surgical History:  Procedure Laterality Date  . FOOT SURGERY Right 2010   tendon donor  . INGUINAL HERNIA REPAIR Right 10/15/2014   Procedure: RIGHT INGUINAL HERNIA REPAIR WITH MESH;  Surgeon: Rolm Bookbinder, MD;  Location: [facility];  Service: General;  Laterality: Right;  . INSERTION OF MESH Right 10/15/2014   Procedure: INSERTION OF MESH;  Surgeon: Rolm Bookbinder, MD;  Location: [facility];  Service: General;  Laterality: Right;  . KIDNEY SURGERY Right 3/16   attempted removal of renal mass but decided to dangerous when opened abdomen       Home Medications    Prior to Admission medications   Medication Sig Start Date End Date Taking? Authorizing Provider  Multiple Vitamins-Minerals (MULTIVITAMIN WITH MINERALS) tablet Take 1 tablet by mouth daily.    Historical Provider, MD  oxyCODONE (OXY IR/ROXICODONE) 5 MG immediate release tablet  Take 2 tablets (10 mg total) by mouth every 6 (six) hours as needed for severe pain. 03/23/16   Wyatt Portela, MD  pantoprazole (PROTONIX) 40 MG tablet Take 1 tablet by mouth daily. 03/05/16   Historical Provider, MD  pazopanib (VOTRIENT) 200 MG tablet TAKE 3 TABLETS (600MG ) ORALLY ONCE DAILY ON AN EMPTY STOMACH (AT LEAST1 HOUR BEFORE OR 2 HOURS AFTER A MEAL). SWALLOW WHOLE. DO NOT CRUSH. 03/17/16   Wyatt Portela, MD  Probiotic Product (PROBIOTIC DAILY PO) Take 1 tablet by mouth daily. 03/26/16   Historical Provider, MD  temazepam (RESTORIL) 30 MG capsule Take 1 capsule (30 mg total) by mouth at bedtime as needed for sleep. 02/27/16   Wyatt Portela, MD    Family History Family History  Problem Relation Age of Onset  . Prostate cancer Brother   . Heart failure Mother   . Diabetes Mother   . Diabetes Father     Social History Social History  Substance Use Topics  . Smoking status: Former Smoker    Packs/day: 1.00    Years: 10.00    Types: Cigarettes    Quit date: 10/10/1999  . Smokeless tobacco: Never Used  . Alcohol use Yes     Comment: 2-4 beers per night     Allergies   Patient has no known allergies.   Review of Systems Review of Systems  All systems reviewed and  negative, other than as noted in HPI.  Physical Exam Updated Vital Signs BP 107/68   Pulse 102   Temp 100.7 F (38.2 C) (Oral)   Resp 21   Ht 6' (1.829 m)   Wt 169 lb (76.7 kg)   SpO2 100%   BMI 22.92 kg/m   Physical Exam  Constitutional: He appears well-developed and well-nourished. No distress.  HENT:  Head: Normocephalic and atraumatic.  Eyes: Conjunctivae are normal. Right eye exhibits no discharge. Left eye exhibits no discharge.  Neck: Neck supple.  Cardiovascular: Regular rhythm and normal heart sounds.  Exam reveals no gallop and no friction rub.   No murmur heard. tachycardic  Pulmonary/Chest: Effort normal and breath sounds normal. No respiratory distress.  Abdominal: Soft. He exhibits no  distension. There is no tenderness.  Musculoskeletal: He exhibits no edema or tenderness.  Neurological: He is alert.  Skin: Skin is warm and dry.  Psychiatric: He has a normal mood and affect. His behavior is normal. Thought content normal.  Nursing note and vitals reviewed.    ED Treatments / Results  Labs (all labs ordered are listed, but only abnormal results are displayed) Labs Reviewed  CBC WITH DIFFERENTIAL/PLATELET - Abnormal; Notable for the following:       Result Value   RBC 3.65 (*)    Hemoglobin 10.9 (*)    HCT 35.1 (*)    RDW 19.3 (*)    Neutro Abs 9.4 (*)    Lymphs Abs 0.2 (*)    All other components within normal limits  BASIC METABOLIC PANEL - Abnormal; Notable for the following:    Chloride 97 (*)    Glucose, Bld 133 (*)    All other components within normal limits  URINALYSIS, ROUTINE W REFLEX MICROSCOPIC - Abnormal; Notable for the following:    Protein, ur 30 (*)    Bacteria, UA FEW (*)    All other components within normal limits  CULTURE, BLOOD (ROUTINE X 2)  CULTURE, BLOOD (ROUTINE X 2)  LACTIC ACID, PLASMA  INFLUENZA PANEL BY PCR (TYPE A & B)  LACTIC ACID, PLASMA    EKG  EKG Interpretation None       Radiology Dg Chest Portable 1 View  Result Date: 03/27/2016 CLINICAL DATA:  Fever. EXAM: PORTABLE CHEST 1 VIEW COMPARISON:  CT 12/12 2017. FINDINGS: Mediastinum and hilar structures normal. Heart size normal. Low lung volumes Mild bibasilar infiltrates cannot be excluded. No pleural effusion or pneumothorax. No acute bony abnormality IMPRESSION: Low lung volumes.  Mild bibasilar infiltrates cannot be excluded . Electronically Signed   By: Marcello Moores  Register   On: 03/27/2016 08:22    Procedures Procedures (including critical care time)  Medications Ordered in ED Medications  acetaminophen (TYLENOL) tablet 650 mg (650 mg Oral Given 03/27/16 0801)  sodium chloride 0.9 % bolus 2,500 mL (0 mLs Intravenous Stopped 03/27/16 0948)  vancomycin  (VANCOCIN) 1,500 mg in sodium chloride 0.9 % 500 mL IVPB (0 mg Intravenous Stopped 03/27/16 1112)  piperacillin-tazobactam (ZOSYN) IVPB 3.375 g (0 g Intravenous Stopped 03/27/16 0920)     Initial Impression / Assessment and Plan / ED Course  I have reviewed the triage vital signs and the nursing notes.  Pertinent labs & imaging results that were available during my care of the patient were reviewed by me and considered in my medical decision making (see chart for details).      Final Clinical Impressions(s) / ED Diagnoses   Final diagnoses:  Community acquired pneumonia, unspecified  laterality    New Prescriptions New Prescriptions   No medications on file     Virgel Manifold, MD 04/13/16 [phone]

## 2016-03-27 NOTE — Progress Notes (Signed)
PHARMACY NOTE:  ANTIMICROBIAL RENAL DOSAGE ADJUSTMENT  Current antimicrobial regimen includes a mismatch between antimicrobial dosage and estimated renal function.  As per policy approved by the Pharmacy & Therapeutics and Medical Executive Committees, the antimicrobial dosage will be adjusted accordingly.  Current antimicrobial dosage:  Cefepime 500 mg IV every 8 hours   Indication: fever, sepsis   Renal Function:  Estimated Creatinine Clearance: 89.8 mL/min (by C-G formula based on SCr of 0.89 mg/dL). []      On intermittent HD, scheduled: []      On CRRT    Antimicrobial dosage has been changed to:  Cefepime 1 gram IV every 8 hours   Additional comments:   Thank you for allowing pharmacy to be a part of this patient's care.  Vincenza Hews, PharmD, BCPS 03/27/2016, 1:32 PM Pager: [phone]

## 2016-03-27 NOTE — ED Triage Notes (Signed)
Pt here from home with c/o weakness and shaking , pt is being treated for renal CA , pt does have a fever of 103.0 this am

## 2016-03-28 DIAGNOSIS — J181 Lobar pneumonia, unspecified organism: Secondary | ICD-10-CM

## 2016-03-28 DIAGNOSIS — C7931 Secondary malignant neoplasm of brain: Secondary | ICD-10-CM | POA: Diagnosis not present

## 2016-03-28 LAB — BLOOD CULTURE ID PANEL (REFLEXED)

## 2016-03-28 LAB — BASIC METABOLIC PANEL
ANION GAP: 11 (ref 5–15)
BUN: 14 mg/dL (ref 6–20)
CALCIUM: 7.9 mg/dL — AB (ref 8.9–10.3)
CO2: 23 mmol/L (ref 22–32)
Chloride: 102 mmol/L (ref 101–111)
Creatinine, Ser: 0.62 mg/dL (ref 0.61–1.24)
GFR calc Af Amer: >60 mL/min (ref >60)
GFR calc non Af Amer: >60 mL/min (ref >60)
Glucose, Bld: 104 mg/dL — ABNORMAL HIGH (ref 65–99)
Potassium: 3.5 mmol/L (ref 3.5–5.1)
SODIUM: 136 mmol/L (ref 135–145)

## 2016-03-28 LAB — CBC
HCT: 30.5 % — ABNORMAL LOW (ref 39.0–52.0)
HEMOGLOBIN: 9.1 g/dL — AB (ref 13.0–17.0)
MCH: 28.8 pg (ref 26.0–34.0)
MCHC: 29.8 g/dL — AB (ref 30.0–36.0)
MCV: 96.5 fL (ref 78.0–100.0)
Platelets: 213 10*3/uL (ref 150–400)
RBC: 3.16 MIL/uL — AB (ref 4.22–5.81)
RDW: 18.9 % — ABNORMAL HIGH (ref 11.5–15.5)
WBC: 4.3 10*3/uL (ref 4.0–10.5)

## 2016-03-28 MED ORDER — OXYCODONE HCL 5 MG PO TABS
10.0000 mg | ORAL_TABLET | Freq: Four times a day (QID) | ORAL | Status: DC | PRN
  Administered 2016-03-29: 10 mg via ORAL
  Filled 2016-03-28: qty 2

## 2016-03-28 MED ORDER — PANTOPRAZOLE SODIUM 40 MG PO TBEC
40.0000 mg | DELAYED_RELEASE_TABLET | Freq: Two times a day (BID) | ORAL | Status: DC
  Administered 2016-03-28 – 2016-03-29 (×2): 40 mg via ORAL
  Filled 2016-03-28 (×2): qty 1

## 2016-03-28 MED ORDER — VANCOMYCIN HCL IN DEXTROSE 750-5 MG/150ML-% IV SOLN
750.0000 mg | Freq: Three times a day (TID) | INTRAVENOUS | Status: DC
  Administered 2016-03-28 – 2016-03-29 (×4): 750 mg via INTRAVENOUS
  Filled 2016-03-28 (×5): qty 150

## 2016-03-28 MED ORDER — TEMAZEPAM 7.5 MG PO CAPS
7.5000 mg | ORAL_CAPSULE | Freq: Every evening | ORAL | Status: DC | PRN
  Administered 2016-03-28: 7.5 mg via ORAL
  Filled 2016-03-28: qty 1

## 2016-03-28 NOTE — Progress Notes (Signed)
[facility]                                                                              Patient Demographics  Jacob Beck, is a 66 y.o. male, DOB - Apr 05, 1950, [id number]  Admit date - 03/27/2016   Admitting Physician Jacob Mainland, DO  Outpatient Primary MD for the patient is Jacob Kroner, MD  Outpatient specialists:   LOS - 0  days    Chief Complaint  Patient presents with  . Fever  . Weakness       Brief summary  Jacob Beck is an unfortunate 66 year old gentleman with history of renal cell carcinoma diagnosed in April 2016 with brain metastasis in August 2017, status post failed surgical resection on 06/01/2014 due to tumor bulk, status post whole brain radiotherapy completed on 10/04/2015. He is currently on chemotherapy. He presented with a few days history of generalized weakness, chills and fever of up to 10 50F in the ED with tachycardia of 1 34 bpm, and tachypneic. Chest x-ray revealed bibasilar infiltrates for which reason he is admitted for CAP.   Assessment & Plan    Principal Problem:   CAP (community acquired pneumonia) Active Problems:   Renal cell carcinoma (HCC)   Brain metastasis (HCC)   Fever   GERD (gastroesophageal reflux disease)   Hypercalcemia  #1 Community-Acquired Pneumonia: Antibiotics Supportive Care.Bronchodilator nebulizations when necessary Follow wbbc count and fever curve  #2 Staphylococcus Sepsis: Admitted with fever, tachycardia and tachypnea as well as hypotension Blood culture positive for staph. Adjust antibiotic to cover staph with Lucianne Lei Follow up final blood culture report  #3 Renal Cell Carcinoma with Brain Metastasis: Continue chemotherapy  #4 Hypercalcemia: Continue Zometa infusions. IV Hydration Monitor, some with renal function  Code Status: Full code DVT Prophylaxis:  heparin  Family Communication:    Disposition Plan: Home  Time Spent in minutes  30 minutes  Procedures:     Consultants:    Antimicrobials:  Vancomycin and cefepime   Medications  Scheduled Meds: . ceFEPime (MAXIPIME) IV  1 g Intravenous Q8H  . heparin  5,000 Units Subcutaneous Q8H  . vancomycin  750 mg Intravenous Q8H   Continuous Infusions: . sodium chloride 100 mL/hr at 03/28/16 1140   PRN Meds:.acetaminophen **OR** acetaminophen, ipratropium-albuterol, ondansetron **OR** ondansetron (ZOFRAN) IV, polyethylene glycol, traZODone   Antibiotics   Anti-infectives    Start     Dose/Rate Route Frequency Ordered Stop   03/28/16 0800  vancomycin (VANCOCIN) IVPB 750 mg/150 ml premix     750 mg 150 mL/hr over 60 Minutes Intravenous Every 8 hours 03/28/16 0741     03/27/16 1400  ceFEPIme (MAXIPIME) 1 g in dextrose 5 % 50 mL IVPB     1 g 100 mL/hr over 30 Minutes Intravenous Every 8 hours 03/27/16 1330     03/27/16 0830  vancomycin (VANCOCIN) 1,500 mg in sodium chloride 0.9 % 500 mL IVPB     1,500 mg 250 mL/hr over 120 Minutes Intravenous  Once 03/27/16 0803 03/27/16 1112   03/27/16 0815  piperacillin-tazobactam (ZOSYN) IVPB 3.375 g     3.375 g 100 mL/hr over 30  Minutes Intravenous  Once 03/27/16 0803 03/27/16 0920        Subjective:   Jacob Beck was seen and examined today. Admission H&P as well as lab work and medications were reviewed and noted. He complains of generalized pain. He wants Restoril to be given for his sleep is of prescribed trazodone. No fever or chills, shortness of breath better Objective:   Vitals:   03/27/16 1315 03/27/16 2026 03/28/16 0405 03/28/16 1003  BP: 107/64 116/66 112/65 114/62  Pulse: 94 90 93 96  Resp: 18 18 18 18   Temp: 98.5 F (36.9 C) 98.7 F (37.1 C) 99.1 F (37.3 C) 98.9 F (37.2 C)  TempSrc: Oral Oral Oral Oral  SpO2: 99% 97% 96% 99%  Weight:  78.5 kg (173 lb 1.6 oz)    Height:  6' (1.829 m)      Intake/Output Summary (Last 24 hours) at 03/28/16 1353 Last data filed at 03/28/16 1146  [place] per 24 hour  Intake              1915 ml  Output             1300 ml  Net              615 ml     Wt Readings from Last 3 Encounters:  03/27/16 78.5 kg (173 lb 1.6 oz)  03/26/16 77 kg (169 lb 11.2 oz)  02/19/16 74.7 kg (164 lb 11.2 oz)     Exam  General: NAD  HEENT: NCAT,  PERRL,MMM, scleral pallor  Neck: SUPPLE, (-) JVD  Cardiovascular: RRR, (-) GALLOP, (-) MURMUR  Respiratory: Diminished breath sounds with mild bibasilar rhonchi  Gastrointestinal: SOFT, (-) DISTENSION, BS(+), (_) TENDERNESS  Ext: (-) CYANOSIS, (-) EDEMA  Neuro: A, OX 3  Skin:(-) RASH  Psych:NORMAL AFFECT/MOOD   Data Reviewed:  I have personally reviewed following labs and imaging studies  Micro Results Recent Results (from the past 240 hour(s))  Blood culture (routine x 2)     Status: None (Preliminary result)   Collection Time: 03/27/16  8:20 AM  Result Value Ref Range Status   Specimen Description BLOOD LEFT ANTECUBITAL  Final   Special Requests BOTTLES DRAWN AEROBIC AND ANAEROBIC  5CC  Final   Culture NO GROWTH 1 DAY  Final   Report Status PENDING  Incomplete  Blood culture (routine x 2)     Status: None (Preliminary result)   Collection Time: 03/27/16  8:54 AM  Result Value Ref Range Status   Specimen Description BLOOD RIGHT FOREARM  Final   Special Requests BOTTLES DRAWN AEROBIC AND ANAEROBIC  5CC  Final   Culture  Setup Time   Final    GRAM POSITIVE COCCI AEROBIC BOTTLE ONLY CRITICAL RESULT CALLED TO, READ BACK BY AND VERIFIED WITH: CARON AMEND,PHARMD @0707  03/28/16 MKELLY,MLT    Culture GRAM POSITIVE COCCI  Final   Report Status PENDING  Incomplete  Blood Culture ID Panel (Reflexed)     Status: Abnormal   Collection Time: 03/27/16  8:54 AM  Result Value Ref Range Status   Enterococcus species NOT DETECTED NOT DETECTED Final   Listeria monocytogenes NOT DETECTED NOT DETECTED Final   Staphylococcus species DETECTED (A) NOT DETECTED Final    Comment: Methicillin (oxacillin) susceptible coagulase negative  staphylococcus. Possible blood culture contaminant (unless isolated from more than one blood culture draw or clinical case suggests pathogenicity). No antibiotic treatment is indicated for blood  culture contaminants. CRITICAL RESULT CALLED TO, READ BACK BY  AND VERIFIED WITH: CARON AMEND,PHARMD @0707  03/28/16 MKELLY,MLT    Staphylococcus aureus NOT DETECTED NOT DETECTED Final   Methicillin resistance NOT DETECTED NOT DETECTED Final   Streptococcus species NOT DETECTED NOT DETECTED Final   Streptococcus agalactiae NOT DETECTED NOT DETECTED Final   Streptococcus pneumoniae NOT DETECTED NOT DETECTED Final   Streptococcus pyogenes NOT DETECTED NOT DETECTED Final   Acinetobacter baumannii NOT DETECTED NOT DETECTED Final   Enterobacteriaceae species NOT DETECTED NOT DETECTED Final   Enterobacter cloacae complex NOT DETECTED NOT DETECTED Final   Escherichia coli NOT DETECTED NOT DETECTED Final   Klebsiella oxytoca NOT DETECTED NOT DETECTED Final   Klebsiella pneumoniae NOT DETECTED NOT DETECTED Final   Proteus species NOT DETECTED NOT DETECTED Final   Serratia marcescens NOT DETECTED NOT DETECTED Final   Haemophilus influenzae NOT DETECTED NOT DETECTED Final   Neisseria meningitidis NOT DETECTED NOT DETECTED Final   Pseudomonas aeruginosa NOT DETECTED NOT DETECTED Final   Candida albicans NOT DETECTED NOT DETECTED Final   Candida glabrata NOT DETECTED NOT DETECTED Final   Candida krusei NOT DETECTED NOT DETECTED Final   Candida parapsilosis NOT DETECTED NOT DETECTED Final   Candida tropicalis NOT DETECTED NOT DETECTED Final  MRSA PCR Screening     Status: None   Collection Time: 03/27/16  2:22 PM  Result Value Ref Range Status   MRSA by PCR NEGATIVE NEGATIVE Final    Comment:        The GeneXpert MRSA Assay (FDA approved for NASAL specimens only), is one component of a comprehensive MRSA colonization surveillance program. It is not intended to diagnose MRSA infection nor to guide  or monitor treatment for MRSA infections.     Radiology Reports Dg Chest Portable 1 View  Result Date: 03/27/2016 CLINICAL DATA:  Fever. EXAM: PORTABLE CHEST 1 VIEW COMPARISON:  CT 12/12 2017. FINDINGS: Mediastinum and hilar structures normal. Heart size normal. Low lung volumes Mild bibasilar infiltrates cannot be excluded. No pleural effusion or pneumothorax. No acute bony abnormality IMPRESSION: Low lung volumes.  Mild bibasilar infiltrates cannot be excluded . Electronically Signed   By: Marcello Moores  Register   On: 03/27/2016 08:22    Lab Data:  CBC:  Recent Labs Lab 03/26/16 0834 03/27/16 0814 03/28/16 0529  WBC 5.0 9.8 4.3  NEUTROABS 3.5 9.4*  --   HGB 11.5* 10.9* 9.1*  HCT 35.3* 35.1* 30.5*  MCV 95.1 96.2 96.5  PLT 352 301 [id number]   Basic Metabolic Panel:  Recent Labs Lab 03/26/16 0834 03/27/16 0814 03/28/16 0529  NA 138 135 136  K 5.0 4.4 3.5  CL  --  97* 102  CO2 29 27 23   GLUCOSE 103 133* 104*  BUN 16.6 16 14   CREATININE 0.7 0.89 0.62  CALCIUM 11.1* 9.6 7.9*   GFR: Estimated Creatinine Clearance: 101 mL/min (by C-G formula based on SCr of 0.62 mg/dL). Liver Function Tests:  Recent Labs Lab 03/26/16 0834  AST 12  ALT 9  ALKPHOS 101  BILITOT 0.39  PROT 8.2  ALBUMIN 2.9*   No results for input(s): LIPASE, AMYLASE in the last 168 hours. No results for input(s): AMMONIA in the last 168 hours. Coagulation Profile: No results for input(s): INR, PROTIME in the last 168 hours. Cardiac Enzymes: No results for input(s): CKTOTAL, CKMB, CKMBINDEX, TROPONINI in the last 168 hours. BNP (last 3 results) No results for input(s): PROBNP in the last 8760 hours. HbA1C: No results for input(s): HGBA1C in the last 72 hours. CBG: No results for  input(s): GLUCAP in the last 168 hours. Lipid Profile: No results for input(s): CHOL, HDL, LDLCALC, TRIG, CHOLHDL, LDLDIRECT in the last 72 hours. Thyroid Function Tests: No results for input(s): TSH, T4TOTAL, FREET4, T3FREE,  THYROIDAB in the last 72 hours. Anemia Panel: No results for input(s): VITAMINB12, FOLATE, FERRITIN, TIBC, IRON, RETICCTPCT in the last 72 hours. Urine analysis:    Component Value Date/Time   COLORURINE YELLOW 03/27/2016 1000   APPEARANCEUR CLEAR 03/27/2016 1000   LABSPEC 1.016 03/27/2016 1000   PHURINE 5.0 03/27/2016 1000   GLUCOSEU NEGATIVE 03/27/2016 1000   HGBUR NEGATIVE 03/27/2016 1000   BILIRUBINUR NEGATIVE 03/27/2016 1000   KETONESUR NEGATIVE 03/27/2016 1000   PROTEINUR 30 (A) 03/27/2016 1000   NITRITE NEGATIVE 03/27/2016 1000   LEUKOCYTESUR NEGATIVE 03/27/2016 1000     OSEI-BONSU,Rozetta Stumpp M.D. [facility] 03/28/2016, 1:53 PM  Pager: [phone] Between 7am to 7pm - call Pager - [phone]  After 7pm go to [email or web address] - password [email or web address]  Call night coverage person covering after 7pm

## 2016-03-28 NOTE — Progress Notes (Signed)
Patient requesting home medications Oxycodone and Protonix.  MD notified.

## 2016-03-28 NOTE — Progress Notes (Signed)
PHARMACY - PHYSICIAN COMMUNICATION CRITICAL VALUE ALERT - BLOOD CULTURE IDENTIFICATION (BCID)  Results for orders placed or performed during the hospital encounter of 03/27/16  Blood Culture ID Panel (Reflexed) (Collected: 03/27/2016  8:54 AM)  Result Value Ref Range   Enterococcus species NOT DETECTED NOT DETECTED   Listeria monocytogenes NOT DETECTED NOT DETECTED   Staphylococcus species DETECTED (A) NOT DETECTED   Staphylococcus aureus NOT DETECTED NOT DETECTED   Methicillin resistance NOT DETECTED NOT DETECTED   Streptococcus species NOT DETECTED NOT DETECTED   Streptococcus agalactiae NOT DETECTED NOT DETECTED   Streptococcus pneumoniae NOT DETECTED NOT DETECTED   Streptococcus pyogenes NOT DETECTED NOT DETECTED   Acinetobacter baumannii NOT DETECTED NOT DETECTED   Enterobacteriaceae species NOT DETECTED NOT DETECTED   Enterobacter cloacae complex NOT DETECTED NOT DETECTED   Escherichia coli NOT DETECTED NOT DETECTED   Klebsiella oxytoca NOT DETECTED NOT DETECTED   Klebsiella pneumoniae NOT DETECTED NOT DETECTED   Proteus species NOT DETECTED NOT DETECTED   Serratia marcescens NOT DETECTED NOT DETECTED   Haemophilus influenzae NOT DETECTED NOT DETECTED   Neisseria meningitidis NOT DETECTED NOT DETECTED   Pseudomonas aeruginosa NOT DETECTED NOT DETECTED   Candida albicans NOT DETECTED NOT DETECTED   Candida glabrata NOT DETECTED NOT DETECTED   Candida krusei NOT DETECTED NOT DETECTED   Candida parapsilosis NOT DETECTED NOT DETECTED   Candida tropicalis NOT DETECTED NOT DETECTED    Name of physician (or Provider) Contacted: Dr Vista Lawman  Changes to prescribed antibiotics required: Add vanc, will determine whether cefepime still required when rounding on pt.  Wynona Neat, PharmD, BCPS  03/28/2016  7:37 AM

## 2016-03-28 NOTE — Progress Notes (Signed)
Pharmacy Antibiotic Note  Jacob Beck is a 66 y.o. male admitted on 03/27/2016 with pneumonia, now w/ staph spp on BCID.  Pharmacy has been consulted to add vancomycin back to ABX regimen.  Plan: Vancomycin 750mg  IV every 8 hours.  Goal trough 15-20 mcg/mL.  Height: 6' (182.9 cm) Weight: 173 lb 1.6 oz (78.5 kg) IBW/kg (Calculated) : 77.6  Temp (24hrs), Avg:99.9 F (37.7 C), Min:98.5 F (36.9 C), Max:103 F (39.4 C)   Recent Labs Lab 03/26/16 0834 03/27/16 0814 03/27/16 1102 03/28/16 0529  WBC 5.0 9.8  --  4.3  CREATININE 0.7 0.89  --  0.62  LATICACIDVEN  --  1.3 1.2  --     Estimated Creatinine Clearance: 101 mL/min (by C-G formula based on SCr of 0.62 mg/dL).    No Known Allergies    Thank you for allowing pharmacy to be a part of this patient's care.  Wynona Neat, PharmD, BCPS  03/28/2016 7:38 AM

## 2016-03-28 NOTE — Progress Notes (Signed)
Patient given and instructed on how and when to use Incentive Spirometry.  Patient returned demonstration correctly.

## 2016-03-28 NOTE — Progress Notes (Signed)
Patient stated Trazodone did not work last night and is requesting Restoril which he takes at home.  MD notified.

## 2016-03-29 DIAGNOSIS — C7931 Secondary malignant neoplasm of brain: Secondary | ICD-10-CM | POA: Diagnosis not present

## 2016-03-29 DIAGNOSIS — J181 Lobar pneumonia, unspecified organism: Secondary | ICD-10-CM | POA: Diagnosis not present

## 2016-03-29 LAB — CBC
HEMATOCRIT: 30 % — AB (ref 39.0–52.0)
Hemoglobin: 9.2 g/dL — ABNORMAL LOW (ref 13.0–17.0)
MCH: 28.9 pg (ref 26.0–34.0)
MCHC: 30.7 g/dL (ref 30.0–36.0)
MCV: 94.3 fL (ref 78.0–100.0)
Platelets: 248 10*3/uL (ref 150–400)
RBC: 3.18 MIL/uL — ABNORMAL LOW (ref 4.22–5.81)
RDW: 18.7 % — AB (ref 11.5–15.5)
WBC: 4.1 10*3/uL (ref 4.0–10.5)

## 2016-03-29 LAB — PROCALCITONIN: PROCALCITONIN: 2.4 ng/mL

## 2016-03-29 MED ORDER — LEVOFLOXACIN 750 MG PO TABS: 750.0000 mg | ORAL_TABLET | Freq: Every day | ORAL | 0 refills | Status: DC

## 2016-03-29 MED ORDER — ONDANSETRON HCL 4 MG PO TABS: 4.0000 mg | ORAL_TABLET | Freq: Four times a day (QID) | ORAL | 0 refills | Status: AC | PRN

## 2016-03-29 MED ORDER — POLYETHYLENE GLYCOL 3350 17 G PO PACK: 17.0000 g | PACK | Freq: Every day | ORAL | 0 refills | Status: AC | PRN

## 2016-03-29 NOTE — Progress Notes (Signed)
Discharge instructions and medications discussed with patient.  Prescriptions given to patient.  All questions answered.  

## 2016-03-29 NOTE — Discharge Summary (Signed)
Jacob Beck, is a 66 y.o. male  DOB [birth year]  MRN [id number].  Admission date:  03/27/2016  Admitting Physician  Truett Mainland, DO  Discharge Date:  03/29/2016   Primary MD  Gara Kroner, MD  Recommendations for primary care physician for things to follow:  CBC and BMP, ionized calcium with PTH   Admission Diagnosis  difficulty walking   Discharge Diagnosis  difficulty walking    Principal Problem:   CAP (community acquired pneumonia) Active Problems:   Renal cell carcinoma ([place])   Brain metastasis ([place])   Fever   GERD (gastroesophageal reflux disease)   Hypercalcemia      Past Medical History:  Diagnosis Date  . Brain metastasis ([place]) dx'd 09/2015   S/P "10 weeks radiation in 2017" (03/27/2016)  . CAP (community acquired pneumonia) 03/27/2016  . GERD (gastroesophageal reflux disease)    occ  . Renal cancer, left ([place]) dx'd 04/2014   renal-left    Past Surgical History:  Procedure Laterality Date  . COLONOSCOPY    . FOOT TENDON SURGERY Right 2010   tendon donor  . INGUINAL HERNIA REPAIR Right 10/15/2014   Procedure: RIGHT INGUINAL HERNIA REPAIR WITH MESH;  Surgeon: Rolm Bookbinder, MD;  Location: [facility];  Service: General;  Laterality: Right;  . INGUINAL HERNIA REPAIR Right   . INSERTION OF MESH Right 10/15/2014   Procedure: INSERTION OF MESH;  Surgeon: Rolm Bookbinder, MD;  Location: [facility];  Service: General;  Laterality: Right;  . KIDNEY SURGERY Right 3/16   attempted removal of renal mass but decided to dangerous when opened abdomen       HPI  from the history and physical done on the day of admission:   Jacob Beck is a 66 y.o. male with medical history significant of renal carcinoma with brain metastases - currently on chemotherap (follod by dr. Alen Blew), GERD, who presented to the ED with c/o generalized priogressive weakness weakness, shaking chill and fever.  Onset of symptoms earlier this morning, when patient woke up feeling weak and more shaky than usual. He has tremor, but this morning it was noticeably worse.   Patient's wife reported that two days ago he was able to woke in the park and was feeling well, has no contacts with sick persons. Patient denied myalgia, runny nose, cough. SOB, headache  ED Course: on arrival patient had mild fever iof 99.1 that increased to 103F, he was tachycardic with HR 134 and tachypneic with RR 31 and hypotensive with BP 91/56 mmHg Blood work was notable for mild ischemia with Hgb 10.9, normal WBC's count, normal renal function and electrolytes. Lactic acid was normal Chest Xray showed reduced lung volumes and was suspicious of bibasilar infiltrates EKG demonstrated TW abnormality that is not knew    Hospital Course:  Mr. Jacob Beck is an unfortunate 66 year old gentleman with history of renal cell carcinoma diagnosed in April 2016 with brain metastasis in August 2017, status post failed surgical resection on 06/01/2014 due to tumor bulk, status post whole  brain radiotherapy completed on 10/04/2015. He is currently on chemotherapy.  He presented with a few days history of generalized weakness, chills and fever of up to 10 56F in the ED with tachycardia of 1 34 bpm, and tachypneic. Chest x-ray revealed bibasilar infiltrates for which reason he was admitted for CAP.   Assessment & Plan   #1 Community-Acquired Pneumonia: He was started on broad spectrum Antibiotics with supportive Care/Bronchodilator nebulizations prn. History fever And leukocytosis was monitored-and he had been afebrile for more than 24 hours with resolution of leukocytosis at time of discharge. His O2 sat O saturation has improved remarkably in the mid to high 90s on room air. His blood culture came back positive for staph in one bottle and is felt to be a contaminant.  #3 Renal Cell Carcinoma with Brain Metastasis: Continue chemotherapy  #4  Hypercalcemia: Continue Zometa infusions as previously  Proper hydration advised Monitor calcium with renal function   Discharge Condition: Stable and satisfactory  Follow UP     Consults obtained - not applicable  Diet and Activity recommendation:  As advised  Discharge Instructions     Discharge Instructions    Call MD for:  difficulty breathing, headache or visual disturbances    Complete by:  As directed    Call MD for:  extreme fatigue    Complete by:  As directed    Call MD for:  persistant dizziness or light-headedness    Complete by:  As directed    Call MD for:  persistant nausea and vomiting    Complete by:  As directed    Call MD for:  redness, tenderness, or signs of infection (pain, swelling, redness, odor or green/yellow discharge around incision site)    Complete by:  As directed    Call MD for:  severe uncontrolled pain    Complete by:  As directed    Call MD for:  temperature >100.4    Complete by:  As directed    Diet - low sodium heart healthy    Complete by:  As directed    Discharge instructions    Complete by:  As directed    Do not take the Restoril and the oxycodone at the same time due to risk of respiratory problems when taken together -at least separate them by about 4 hours   Increase activity slowly    Complete by:  As directed         Discharge Medications     Allergies as of 03/29/2016   No Known Allergies     Medication List    STOP taking these medications   ondansetron 8 MG disintegrating tablet Commonly known as:  ZOFRAN-ODT     TAKE these medications   levofloxacin 750 MG tablet Commonly known as:  LEVAQUIN Take 1 tablet (750 mg total) by mouth daily.   multivitamin with minerals tablet Take 1 tablet by mouth daily.   ondansetron 4 MG tablet Commonly known as:  ZOFRAN Take 1 tablet (4 mg total) by mouth every 6 (six) hours as needed for nausea.   oxyCODONE 5 MG immediate release tablet Commonly known as:  Oxy  IR/ROXICODONE Take 2 tablets (10 mg total) by mouth every 6 (six) hours as needed for severe pain.   pantoprazole 40 MG tablet Commonly known as:  PROTONIX Take 1 tablet by mouth 2 (two) times daily.   pazopanib 200 MG tablet Commonly known as:  VOTRIENT TAKE 3 TABLETS (600MG ) ORALLY ONCE DAILY ON AN  EMPTY STOMACH (AT LEAST1 HOUR BEFORE OR 2 HOURS AFTER A MEAL). SWALLOW WHOLE. DO NOT CRUSH.   polyethylene glycol packet Commonly known as:  MIRALAX / GLYCOLAX Take 17 g by mouth daily as needed for mild constipation.   PROBIOTIC DAILY PO Take 1 tablet by mouth daily.   temazepam 30 MG capsule Commonly known as:  RESTORIL Take 1 capsule (30 mg total) by mouth at bedtime as needed for sleep.       Major procedures and Radiology Reports   Dg Chest Portable 1 View  Result Date: 03/27/2016 CLINICAL DATA:  Fever. EXAM: PORTABLE CHEST 1 VIEW COMPARISON:  CT 12/12 2017. FINDINGS: Mediastinum and hilar structures normal. Heart size normal. Low lung volumes Mild bibasilar infiltrates cannot be excluded. No pleural effusion or pneumothorax. No acute bony abnormality IMPRESSION: Low lung volumes.  Mild bibasilar infiltrates cannot be excluded . Electronically Signed   By: Marcello Moores  Register   On: 03/27/2016 08:22      Recent Results (from the past 240 hour(s))  Blood culture (routine x 2)     Status: None (Preliminary result)   Collection Time: 03/27/16  8:20 AM  Result Value Ref Range Status   Specimen Description BLOOD LEFT ANTECUBITAL  Final   Special Requests BOTTLES DRAWN AEROBIC AND ANAEROBIC  5CC  Final   Culture NO GROWTH 1 DAY  Final   Report Status PENDING  Incomplete  Blood culture (routine x 2)     Status: Abnormal (Preliminary result)   Collection Time: 03/27/16  8:54 AM  Result Value Ref Range Status   Specimen Description BLOOD RIGHT FOREARM  Final   Special Requests BOTTLES DRAWN AEROBIC AND ANAEROBIC  5CC  Final   Culture  Setup Time   Final    GRAM POSITIVE  [facility] AEROBIC BOTTLE ONLY CRITICAL RESULT CALLED TO, READ BACK BY AND VERIFIED WITH: CARON AMEND,PHARMD @0707  03/28/16 MKELLY,MLT    Culture (A)  Final    STAPHYLOCOCCUS SPECIES (COAGULASE NEGATIVE) THE SIGNIFICANCE OF ISOLATING THIS ORGANISM FROM A SINGLE SET OF BLOOD CULTURES WHEN MULTIPLE SETS ARE DRAWN IS UNCERTAIN. PLEASE NOTIFY THE MICROBIOLOGY DEPARTMENT WITHIN ONE WEEK IF SPECIATION AND SENSITIVITIES ARE REQUIRED.    Report Status PENDING  Incomplete  Blood Culture ID Panel (Reflexed)     Status: Abnormal   Collection Time: 03/27/16  8:54 AM  Result Value Ref Range Status   Enterococcus species NOT DETECTED NOT DETECTED Final   Listeria monocytogenes NOT DETECTED NOT DETECTED Final   Staphylococcus species DETECTED (A) NOT DETECTED Final    Comment: Methicillin (oxacillin) susceptible coagulase negative staphylococcus. Possible blood culture contaminant (unless isolated from more than one blood culture draw or clinical case suggests pathogenicity). No antibiotic treatment is indicated for blood  culture contaminants. CRITICAL RESULT CALLED TO, READ BACK BY AND VERIFIED WITH: CARON AMEND,PHARMD @0707  03/28/16 MKELLY,MLT    Staphylococcus aureus NOT DETECTED NOT DETECTED Final   Methicillin resistance NOT DETECTED NOT DETECTED Final   Streptococcus species NOT DETECTED NOT DETECTED Final   Streptococcus agalactiae NOT DETECTED NOT DETECTED Final   Streptococcus pneumoniae NOT DETECTED NOT DETECTED Final   Streptococcus pyogenes NOT DETECTED NOT DETECTED Final   Acinetobacter baumannii NOT DETECTED NOT DETECTED Final   Enterobacteriaceae species NOT DETECTED NOT DETECTED Final   Enterobacter cloacae complex NOT DETECTED NOT DETECTED Final   Escherichia coli NOT DETECTED NOT DETECTED Final   Klebsiella oxytoca NOT DETECTED NOT DETECTED Final   Klebsiella pneumoniae NOT DETECTED NOT DETECTED Final  Proteus species NOT DETECTED NOT DETECTED Final   Serratia marcescens NOT DETECTED  NOT DETECTED Final   Haemophilus influenzae NOT DETECTED NOT DETECTED Final   Neisseria meningitidis NOT DETECTED NOT DETECTED Final   Pseudomonas aeruginosa NOT DETECTED NOT DETECTED Final   Candida albicans NOT DETECTED NOT DETECTED Final   Candida glabrata NOT DETECTED NOT DETECTED Final   Candida krusei NOT DETECTED NOT DETECTED Final   Candida parapsilosis NOT DETECTED NOT DETECTED Final   Candida tropicalis NOT DETECTED NOT DETECTED Final  MRSA PCR Screening     Status: None   Collection Time: 03/27/16  2:22 PM  Result Value Ref Range Status   MRSA by PCR NEGATIVE NEGATIVE Final    Comment:        The GeneXpert MRSA Assay (FDA approved for NASAL specimens only), is one component of a comprehensive MRSA colonization surveillance program. It is not intended to diagnose MRSA infection nor to guide or monitor treatment for MRSA infections.        Today   Subjective    Dashawn Bicknell was seen and examined prior to discharge. Wife updated on patient's overall status, care plan. No acute events overnight   Objective   Blood pressure 122/70, pulse 81, temperature 98.9 F (37.2 C), temperature source Oral, resp. rate 18, height 6' (1.829 m), weight 80.2 kg (176 lb 14.4 oz), SpO2 99 %.   Intake/Output Summary (Last 24 hours) at 03/29/16 1318 Last data filed at 03/29/16 1051  Jha per 24 hour  Intake             3600 ml  Output             2725 ml  Net              875 ml    Exam  General: NAD  HEENT: NCAT,  PERRL,MMM, scleral pallor  Neck: SUPPLE, (-) JVD  Cardiovascular: RRR, (-) GALLOP, (-) MURMUR  Respiratory:  Mildly mildly diminished breath sounds with few occasional  bibasilar rhonchi  Gastrointestinal: SOFT, (-) DISTENSION, BS(+), (_) TENDERNESS  Ext: (-) CYANOSIS, (-) EDEMA  Neuro: A, OX 3  Skin:(-) RASH  Psych:NORMAL AFFECT/MOOD   Data Review   CBC w Diff: Lab Results  Component Value Date   WBC 4.1 03/29/2016   HGB 9.2 (L) 03/29/2016    HGB 11.5 (L) 03/26/2016   HCT 30.0 (L) 03/29/2016   HCT 35.3 (L) 03/26/2016   PLT 248 03/29/2016   PLT 352 03/26/2016   LYMPHOPCT 2 03/27/2016   LYMPHOPCT 16.0 03/26/2016   MONOPCT 2 03/27/2016   MONOPCT 11.3 03/26/2016   EOSPCT 0 03/27/2016   EOSPCT 0.9 03/26/2016   BASOPCT 0 03/27/2016   BASOPCT 0.6 03/26/2016    CMP: Lab Results  Component Value Date   NA 136 03/28/2016   NA 138 03/26/2016   K 3.5 03/28/2016   K 5.0 03/26/2016   CL 102 03/28/2016   CO2 23 03/28/2016   CO2 29 03/26/2016   BUN 14 03/28/2016   BUN 16.6 03/26/2016   CREATININE 0.62 03/28/2016   CREATININE 0.7 03/26/2016   PROT 8.2 03/26/2016   ALBUMIN 2.9 (L) 03/26/2016   BILITOT 0.39 03/26/2016   ALKPHOS 101 03/26/2016   AST 12 03/26/2016   ALT 9 03/26/2016  .   Total Discharge time is about 33 minutes  OSEI-BONSU,Estreya Clay M.D on 03/29/2016 at 1:18 PM  [facility]   Office  [phone]  Dragon dictation system was used to create  this note, attempts have been made to correct errors, however presence of uncorrected errors is not a reflection quality of care provided

## 2016-03-30 LAB — CULTURE, BLOOD (ROUTINE X 2)

## 2016-04-01 LAB — CULTURE, BLOOD (ROUTINE X 2): Culture: NO GROWTH

## 2016-04-08 ENCOUNTER — Other Ambulatory Visit: Payer: Self-pay | Admitting: Oncology

## 2016-04-08 MED ORDER — OXYCODONE HCL 5 MG PO TABS: 10.0000 mg | ORAL_TABLET | Freq: Four times a day (QID) | ORAL | 0 refills | Status: DC | PRN

## 2016-04-08 NOTE — Telephone Encounter (Signed)
Informed wife prescription is ready for pick up.

## 2016-04-09 DIAGNOSIS — C649 Malignant neoplasm of unspecified kidney, except renal pelvis: Secondary | ICD-10-CM | POA: Diagnosis not present

## 2016-04-09 DIAGNOSIS — Z23 Encounter for immunization: Secondary | ICD-10-CM | POA: Diagnosis not present

## 2016-04-09 DIAGNOSIS — K219 Gastro-esophageal reflux disease without esophagitis: Secondary | ICD-10-CM | POA: Diagnosis not present

## 2016-04-09 DIAGNOSIS — L989 Disorder of the skin and subcutaneous tissue, unspecified: Secondary | ICD-10-CM | POA: Diagnosis not present

## 2016-04-09 DIAGNOSIS — Z8701 Personal history of pneumonia (recurrent): Secondary | ICD-10-CM | POA: Diagnosis not present

## 2016-04-17 ENCOUNTER — Telehealth: Payer: Self-pay | Admitting: Oncology

## 2016-04-17 ENCOUNTER — Other Ambulatory Visit: Payer: Self-pay | Admitting: *Deleted

## 2016-04-17 MED ORDER — TEMAZEPAM 30 MG PO CAPS: 30.0000 mg | ORAL_CAPSULE | Freq: Every evening | ORAL | 1 refills | Status: DC | PRN

## 2016-04-17 MED ORDER — OXYCODONE HCL 5 MG PO TABS: 10.0000 mg | ORAL_TABLET | Freq: Four times a day (QID) | ORAL | 0 refills | Status: DC | PRN

## 2016-04-17 NOTE — Telephone Encounter (Signed)
Per wife sharon's request, refill scripts for oxycodone and restoril, left at front for p/u. Patient notified.

## 2016-04-17 NOTE — Telephone Encounter (Signed)
Faxed LTD paperwork to [facility] attn Ara Kussmaul fax [phone]

## 2016-04-17 NOTE — Telephone Encounter (Signed)
Faxed disability paperwork to [facility] fax [phone]

## 2016-04-24 ENCOUNTER — Ambulatory Visit (HOSPITAL_COMMUNITY)
Admission: RE | Admit: 2016-04-24 | Discharge: 2016-04-24 | Disposition: A | Discharge status: Home / Self Care | Payer: Medicare HMO | Source: Ambulatory Visit | Attending: Oncology | Admitting: Oncology

## 2016-04-24 ENCOUNTER — Other Ambulatory Visit (HOSPITAL_BASED_OUTPATIENT_CLINIC_OR_DEPARTMENT_OTHER): Payer: Medicare HMO

## 2016-04-24 DIAGNOSIS — C649 Malignant neoplasm of unspecified kidney, except renal pelvis: Secondary | ICD-10-CM | POA: Diagnosis present

## 2016-04-24 DIAGNOSIS — R590 Localized enlarged lymph nodes: Secondary | ICD-10-CM | POA: Insufficient documentation

## 2016-04-24 DIAGNOSIS — C642 Malignant neoplasm of left kidney, except renal pelvis: Secondary | ICD-10-CM | POA: Insufficient documentation

## 2016-04-24 DIAGNOSIS — R918 Other nonspecific abnormal finding of lung field: Secondary | ICD-10-CM | POA: Insufficient documentation

## 2016-04-24 DIAGNOSIS — C7931 Secondary malignant neoplasm of brain: Secondary | ICD-10-CM | POA: Diagnosis not present

## 2016-04-24 DIAGNOSIS — I700 Atherosclerosis of aorta: Secondary | ICD-10-CM | POA: Insufficient documentation

## 2016-04-24 LAB — COMPREHENSIVE METABOLIC PANEL
ALT: 11 U/L (ref 0–55)
AST: 13 U/L (ref 5–34)
Albumin: 2.7 g/dL — ABNORMAL LOW (ref 3.5–5.0)
Alkaline Phosphatase: 116 U/L (ref 40–150)
Anion Gap: 12 mEq/L — ABNORMAL HIGH (ref 3–11)
BILIRUBIN TOTAL: 0.41 mg/dL (ref 0.20–1.20)
BUN: 8.3 mg/dL (ref 7.0–26.0)
CHLORIDE: 93 meq/L — AB (ref 98–109)
CO2: 27 meq/L (ref 22–29)
Calcium: 10.4 mg/dL (ref 8.4–10.4)
Creatinine: 0.7 mg/dL (ref 0.7–1.3)
GLUCOSE: 102 mg/dL (ref 70–140)
POTASSIUM: 4.6 meq/L (ref 3.5–5.1)
SODIUM: 132 meq/L — AB (ref 136–145)
TOTAL PROTEIN: 8.2 g/dL (ref 6.4–8.3)

## 2016-04-24 LAB — CBC WITH DIFFERENTIAL/PLATELET
BASO%: 0.2 % (ref 0.0–2.0)
Basophils Absolute: 0 10*3/uL (ref 0.0–0.1)
EOS ABS: 0 10*3/uL (ref 0.0–0.5)
EOS%: 0.4 % (ref 0.0–7.0)
HCT: 34.5 % — ABNORMAL LOW (ref 38.4–49.9)
HGB: 10.6 g/dL — ABNORMAL LOW (ref 13.0–17.1)
LYMPH%: 12.8 % — AB (ref 14.0–49.0)
MCH: 26.8 pg — ABNORMAL LOW (ref 27.2–33.4)
MCHC: 30.7 g/dL — ABNORMAL LOW (ref 32.0–36.0)
MCV: 87.1 fL (ref 79.3–98.0)
MONO#: 0.6 10*3/uL (ref 0.1–0.9)
MONO%: 12.8 % (ref 0.0–14.0)
NEUT%: 73.8 % (ref 39.0–75.0)
NEUTROS ABS: 3.7 10*3/uL (ref 1.5–6.5)
Platelets: 340 10*3/uL (ref 140–400)
RBC: 3.96 10*6/uL — AB (ref 4.20–5.82)
RDW: 19.7 % — ABNORMAL HIGH (ref 11.0–14.6)
WBC: 4.9 10*3/uL (ref 4.0–10.3)
lymph#: 0.6 10*3/uL — ABNORMAL LOW (ref 0.9–3.3)

## 2016-04-24 MED ORDER — IOPAMIDOL (ISOVUE-300) INJECTION 61%
INTRAVENOUS | Status: AC
  Administered 2016-04-24: 100 mL
  Filled 2016-04-24: qty 100

## 2016-04-28 ENCOUNTER — Telehealth: Payer: Self-pay | Admitting: Oncology

## 2016-04-28 ENCOUNTER — Ambulatory Visit (HOSPITAL_BASED_OUTPATIENT_CLINIC_OR_DEPARTMENT_OTHER): Payer: Medicare HMO | Admitting: Oncology

## 2016-04-28 VITALS — BP 125/71 | HR 97 | Temp 98.6°F | Resp 18 | Ht 72.0 in | Wt 171.6 lb

## 2016-04-28 DIAGNOSIS — C649 Malignant neoplasm of unspecified kidney, except renal pelvis: Secondary | ICD-10-CM

## 2016-04-28 DIAGNOSIS — R110 Nausea: Secondary | ICD-10-CM | POA: Diagnosis not present

## 2016-04-28 DIAGNOSIS — C642 Malignant neoplasm of left kidney, except renal pelvis: Secondary | ICD-10-CM | POA: Diagnosis not present

## 2016-04-28 DIAGNOSIS — R251 Tremor, unspecified: Secondary | ICD-10-CM | POA: Diagnosis not present

## 2016-04-28 DIAGNOSIS — R270 Ataxia, unspecified: Secondary | ICD-10-CM | POA: Diagnosis not present

## 2016-04-28 DIAGNOSIS — M791 Myalgia: Secondary | ICD-10-CM

## 2016-04-28 DIAGNOSIS — R1013 Epigastric pain: Secondary | ICD-10-CM

## 2016-04-28 DIAGNOSIS — C7931 Secondary malignant neoplasm of brain: Secondary | ICD-10-CM

## 2016-04-28 MED ORDER — CABOZANTINIB S-MALATE 40 MG PO TABS: 40.0000 mg | ORAL_TABLET | Freq: Every day | ORAL | 0 refills | Status: DC

## 2016-04-28 NOTE — Addendum Note (Signed)
Addended by: Amelia Jo I on: 04/28/2016 09:50 AM   Modules accepted: Orders

## 2016-04-28 NOTE — Telephone Encounter (Signed)
Gave patient avs report and appointments for May. Date/time for 5/4 per patient cannot come in the PM and could not do 5/1 appointment requested per 3/27 los.

## 2016-04-28 NOTE — Progress Notes (Signed)
Hematology and Oncology Follow Up Visit  Jacob Beck [id number] [birth year] 66 y.o. 04/28/2016 9:19 AM   Principle Diagnosis: 66 year old gentleman with renal cell carcinoma diagnosed in April 2016. He presented with a large tumor on the left kidney measuring 15 cm and extending into the retroperitoneum causing proximal renal vein thrombosis.  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   Prior Therapy:  Attempted surgical resection on 06/01/2014 that was unsuccessful given the size of tumor. Whole brain radiation therapy completed on 10/04/2015.  Current therapy:   Votrient 800 mg daily started on 06/07/2014. He is currently receiving 600 mg daily for better tolerance.   Interim History: Jacob Beck presents today for a follow-up visit with his wife. Since the last visit, he reports no major changes in his health. He continues to have issues with tremors and unsteadiness although he denied any falls or syncope. He is having more GI issues predominantly abdominal pain and occasional dyspepsia. He was able to ambulate without difficulties and has not reported any falls. His appetite remained relatively stable including his weight been stable as well.   He continues to take Votrient without any major complications at this time. He he has reported some more fatigue and tiredness since the last visit. He continues to have periodic body aches and pains. His pain is reasonably controlled on oxycodone.   He does not report any fevers, chills or sweats. He does not report any chest pain, palpitation, orthopnea, leg edema. He does not report any cough, hemoptysis, hematemesis. He does not report any early satiety or abdominal distention. He does not report any skeletal pains to his back pain shoulder pain or hip pain. He does not report any lymphadenopathy or petechiae. Remainder of review of systems unremarkable.   Medications: I have reviewed the patient's current medications.  Current  Outpatient Prescriptions  Medication Sig Dispense Refill  . cabozantinib S-Malate (CABOMETYX) 40 MG TABS Take 40 mg by mouth daily. 30 tablet 0  . levofloxacin (LEVAQUIN) 750 MG tablet Take 1 tablet (750 mg total) by mouth daily. 5 tablet 0  . Multiple Vitamins-Minerals (MULTIVITAMIN WITH MINERALS) tablet Take 1 tablet by mouth daily.    . ondansetron (ZOFRAN) 4 MG tablet Take 1 tablet (4 mg total) by mouth every 6 (six) hours as needed for nausea. 20 tablet 0  . oxyCODONE (OXY IR/ROXICODONE) 5 MG immediate release tablet Take 2 tablets (10 mg total) by mouth every 6 (six) hours as needed for severe pain. 90 tablet 0  . pantoprazole (PROTONIX) 40 MG tablet Take 1 tablet by mouth 2 (two) times daily.     . polyethylene glycol (MIRALAX / GLYCOLAX) packet Take 17 g by mouth daily as needed for mild constipation. 14 each 0  . Probiotic Product (PROBIOTIC DAILY PO) Take 1 tablet by mouth daily.    . temazepam (RESTORIL) 30 MG capsule Take 1 capsule (30 mg total) by mouth at bedtime as needed for sleep. 30 capsule 1   No current facility-administered medications for this visit.      Allergies:  No Known Allergies  Past Medical History, Surgical history, Social history, and Family History were reviewed and updated.   Physical Exam: Blood pressure 125/71, pulse 97, temperature 98.6 F (37 C), temperature source Oral, resp. rate 18, height 6' (1.829 m), weight 171 lb 9.6 oz (77.8 kg), SpO2 96 %. ECOG: 1 General appearance: Alert, awake gentleman without distress. Chronically ill-appearing. Head: Normocephalic, without obvious abnormality. No oral thrush noted. Neck: no  adenopathy no thyroid masses. Lymph nodes: Cervical, supraclavicular, and axillary nodes normal. Heart:regular rate and rhythm, S1, S2 normal, no murmur, click, rub or gallop Lung:chest clear, no wheezing, rales, normal symmetric air entry Abdomin: soft, non-tender, without masses or organomegaly no rebound or guarding. EXT:no  erythema, induration, or nodules Neurological examination: No new deficits noted. He is ambulating without any difficulties. Resting tremor noted today.   Lab Results: Lab Results  Component Value Date   WBC 4.9 04/24/2016   HGB 10.6 (L) 04/24/2016   HCT 34.5 (L) 04/24/2016   MCV 87.1 04/24/2016   PLT 340 04/24/2016     Chemistry      Component Value Date/Time   NA 132 (L) 04/24/2016 0918   K 4.6 04/24/2016 0918   CL 102 03/28/2016 0529   CO2 27 04/24/2016 0918   BUN 8.3 04/24/2016 0918   CREATININE 0.7 04/24/2016 0918      Component Value Date/Time   CALCIUM 10.4 04/24/2016 0918   ALKPHOS 116 04/24/2016 0918   AST 13 04/24/2016 0918   ALT 11 04/24/2016 0918   BILITOT 0.41 04/24/2016 0918      EXAM: CT CHEST, ABDOMEN, AND PELVIS WITH CONTRAST  TECHNIQUE: Multidetector CT imaging of the chest, abdomen and pelvis was performed following the standard protocol during bolus administration of intravenous contrast.  CONTRAST:  146mL ISOVUE-300 IOPAMIDOL (ISOVUE-300) INJECTION 61%  COMPARISON:  01/14/2016  FINDINGS: CT CHEST FINDINGS  Cardiovascular: Heart size normal. Trace anterior pericardial fluid evident.  Mediastinum/Nodes: No mediastinal lymphadenopathy. There is no hilar lymphadenopathy. The esophagus has normal imaging features. There is no axillary lymphadenopathy.  Lungs/Pleura: Multiple bilateral pulmonary nodules are identified. Some of the tiny nodules are stable in the interval but some of the other nodules show interval progression. Dominant nodule is a 9 mm right lower lobe nodule seen image 103 series 13 which was 5 mm on the previous study. Cavitary 4 mm nodule right upper lobe on image 35 is new in the interval. 6 mm left upper lobe nodule seen image 75 was about 4 mm previously. Progression of anterior right upper lobe peribronchovascular nodularity likely reflective of atypical infection but attention on follow-up recommended as  metastatic disease not excluded. Irregular opacity anterior right lower lobe, retracting the major fissure on image 75 appears slightly progressed since prior study.  Musculoskeletal: Bone windows reveal no worrisome lytic or sclerotic osseous lesions.  CT ABDOMEN PELVIS FINDINGS  Hepatobiliary: No focal abnormality within the liver parenchyma. Gallbladder decompressed. No intrahepatic or extrahepatic biliary dilation.  Pancreas: No focal mass lesion. No dilatation of the main duct. No intraparenchymal cyst. No peripancreatic edema.  Spleen: No splenomegaly. No focal mass lesion.  Adrenals/Urinary Tract: Right adrenal gland normal. Left adrenal gland not clearly visualized. Right kidney stable. The large heterogeneous calcified mass in the upper pole the left kidney measures 11 x 10 cm today compared to 10.5 x 10 cm previously. Left renal vein thrombosis again noted. No hydroureter. Bladder unremarkable.  Stomach/Bowel: Stomach is nondistended. No gastric wall thickening. No evidence of outlet obstruction. Duodenum is normally positioned as is the ligament of Treitz. No small bowel wall thickening. No small bowel dilatation. The terminal ileum is normal. The appendix is normal. Colon is unremarkable except for apparent wall thickening in the distal rectum.  Vascular/Lymphatic: There is abdominal aortic atherosclerosis without aneurysm. Portal vein and superior mesenteric vein are patent. 17 mm short axis necrotic left para-aortic lymph node is new in the interval. No pelvic sidewall lymphadenopathy.  Reproductive: The prostate gland and seminal vesicles have normal imaging features.  Other: No intraperitoneal free fluid.  Musculoskeletal: Lucent lesion in the L2 vertebral body is stable in the interval in nonspecific.  IMPRESSION: 1. No substantial change in the large necrotic left renal cell carcinoma with associated thrombus in the left renal vein. 2.  Interval progression of pulmonary nodules, suggesting pulmonary metastases. 3. Progression of peribronchovascular nodularity anterior right lung. Probably related atypical infection but attention on follow-up recommended. 4. New necrotic left para- aortic lymphadenopathy, consistent with metastatic spread. 5. Asymmetric wall thickening distal rectum. Neoplasm could have this appearance on CT. The region should be amenable to clinical inspection. 6.  Abdominal Aortic Atherosclerois (ICD44-   14 year old gentleman with the following issues:  1.Renal cell carcinoma presented with a large mass measuring 15.0 x 14.1 x 9.9 cm left renal mass extending off the upper pole of the left kidney into the retroperitoneum. He has also evidence of lymphadenopathy and small pulmonary nodules. He presented with abdominal pain and 20 pound weight loss. This is a biopsy proven to be renal cell carcinoma with a biopsy done on 05/04/2014. Attempted surgical resection for debulking purposes was unsuccessful.  He is currently on Votrient 600 mg daily.   CT scan on 03/27/2016 was personally reviewed today and discussed with the patient and his wife. His disease appears to have slightly progressed and his kidney mass has not changed.  Options of therapy was reviewed today including continuing Votrient, using different salvage therapy with Nivolumab, Cabometyx or both. Supportive care only was also reviewed as an option.  Risks and benefits of all these options were debated today and I do not think he is a candidate for combined therapy. I think the combination of immunotherapy with oral targeted therapy for combined immunotherapy would be too toxic at this time. His performance status does not support using combination therapy.  After discussion today, he is agreeable to proceed with Cabometyx at 40 mg daily. Risks and benefits of this medication was again reviewed. Complications include nausea, fatigue, diarrhea,  hand-foot syndrome, hypertension, increase in liver function tests, rarely abdominal perforation.  2. Brain metastasis: Detected on MRI on 09/05/2015. He completed whole brain radiation and repeat imaging studies should be scheduled in the future.  3. Diffuse body pain: She is currently on oxycodone which helped his body pain. This will be refilled for him periodically. He has failed other pain medication in the past and is medically necessary for him to have this medication to control his pain.  4. Nausea, dyspepsia and epigastric abdominal pain: Still an issue and I anticipate improvement after discontinuation of Votrient.  5. Tremors and unsteadiness with ataxia: This could be exacerbated by his alcohol consumption appears to be stable at this time.  6. Liver function surveillance: Continues to be within normal range.  7. Prognosis: I continue to address this issue with him periodically. He understands any treatment goal is palliative at this time.  8. Hypercalcemia: His calcium has normalized at this time.  9. Follow-up: Will be in 4 weeks.    [facility], MD 3/27/20189:19 AM

## 2016-04-29 ENCOUNTER — Other Ambulatory Visit: Payer: Self-pay | Admitting: *Deleted

## 2016-04-29 ENCOUNTER — Telehealth: Payer: Self-pay | Admitting: Pharmacist

## 2016-04-29 NOTE — Telephone Encounter (Signed)
Received new Cabometyx prescription for renal cell.  Labs reviewed - ok for treatment.  Recorded BP have been wnl. Cabometyx dose is appropriate for patient status and indication. Current medication list in Epic assessed for drug/drug interactions:  No drug/drug interactions identified with Cabometyx.  Cabometyx prescription faxed to [facility] outpatient.  Will continue to follow,  Raul Del, PharmD, BCPS, [facility] [phone]

## 2016-04-30 NOTE — Telephone Encounter (Signed)
Oral Chemotherapy Pharmacist Encounter  Prior authorization submitted on CoverMyMeds Status is pending [place] [id number] May take 72 business hours for determination  Oral Oncology Clinic will continue to follow.  Johny Drilling, PharmD, BCPS, BCOP 04/30/2016  4:38 PM Oral Oncology Clinic [phone]

## 2016-05-01 ENCOUNTER — Other Ambulatory Visit: Payer: Self-pay | Admitting: Oncology

## 2016-05-03 ENCOUNTER — Other Ambulatory Visit: Payer: Self-pay | Admitting: Oncology

## 2016-05-03 ENCOUNTER — Encounter: Payer: Self-pay | Admitting: Oncology

## 2016-05-04 ENCOUNTER — Other Ambulatory Visit: Payer: Self-pay | Admitting: Oncology

## 2016-05-04 MED ORDER — OXYCODONE HCL 5 MG PO TABS: 10.0000 mg | ORAL_TABLET | Freq: Four times a day (QID) | ORAL | 0 refills | Status: DC | PRN

## 2016-05-04 NOTE — Telephone Encounter (Signed)
Called and left a message that his prescription is ready for pick up.

## 2016-05-05 ENCOUNTER — Telehealth: Payer: Self-pay | Admitting: *Deleted

## 2016-05-05 ENCOUNTER — Encounter: Payer: Self-pay | Admitting: Oncology

## 2016-05-05 NOTE — Telephone Encounter (Signed)
Returning patient's phone call. No answer. Instructed him to call [facility] with what he needs.

## 2016-05-06 ENCOUNTER — Other Ambulatory Visit: Payer: Self-pay | Admitting: Oncology

## 2016-05-06 DIAGNOSIS — C7931 Secondary malignant neoplasm of brain: Secondary | ICD-10-CM

## 2016-05-06 DIAGNOSIS — C649 Malignant neoplasm of unspecified kidney, except renal pelvis: Secondary | ICD-10-CM

## 2016-05-07 ENCOUNTER — Telehealth: Payer: Self-pay | Admitting: Oncology

## 2016-05-07 MED FILL — CABOMETYX 40 MG TABLET: 40 | 30 days supply | Qty: 30 | Fill #0

## 2016-05-07 NOTE — Telephone Encounter (Signed)
Patient's wife called to talk to Dr Alen Blew and I took the message and hand delivered to Dr Alen Blew to call her back.  She said she did not want to speak to the nurse she only wanted to talk to the Dr

## 2016-05-07 NOTE — Telephone Encounter (Signed)
Oral Chemotherapy Pharmacist Encounter  Received notification from [facility] that prior authorization of patient's Cabometyx has been approved Effective dates: 02/01/16-02/01/17 Referral# [id number]  I called [facility] to have them process prescription, copayment $852 I called and discussed copayment with patient's wife, Ivin Booty. Ivin Booty provided me with billing information for a copayment grant they had received to help pay for patient's Votrient from [employer] ([place] fund phone number: [phone]) Billing info: ID: [id number] Group: [id number] PCN: [id number] BIN: [id number]  I provided this billing information to [facility], copayment now $0. They will call patient when ready for pick-up, likely on 05/08/16.  I spoke with patient's wife, Ivin Booty, for overview of new oral chemotherapy medication: Cabometyx.  Counseled patient on administration, dosing, side effects, safe handling, and monitoring. Patient will take 1 tablet (40mg  total) by mouth daily, on an empty stomach, 1 hour before or 2 hours after a meal. Patient will avoid grapefruit products while on treatment with Cabometyx.  Side effects include but not limited to: N/V/D, fatigue, hypertension, hand-foot syndrome.  Ivin Booty voiced understanding and appreciation.   All questions answered.  Ivin Booty knows to call the office with any questions or concerns.  Thank you,  Johny Drilling, PharmD, BCPS, BCOP 05/07/2016  2:43 PM Oral Oncology Clinic [phone]

## 2016-05-09 ENCOUNTER — Ambulatory Visit (HOSPITAL_COMMUNITY)
Admission: RE | Admit: 2016-05-09 | Discharge: 2016-05-09 | Disposition: A | Discharge status: Home / Self Care | Payer: Medicare HMO | Source: Ambulatory Visit | Attending: Oncology | Admitting: Oncology

## 2016-05-09 DIAGNOSIS — C7931 Secondary malignant neoplasm of brain: Secondary | ICD-10-CM | POA: Diagnosis not present

## 2016-05-09 DIAGNOSIS — C649 Malignant neoplasm of unspecified kidney, except renal pelvis: Secondary | ICD-10-CM | POA: Diagnosis not present

## 2016-05-09 MED ORDER — GADOBENATE DIMEGLUMINE 529 MG/ML IV SOLN
15.0000 mL | Freq: Once | INTRAVENOUS | Status: AC | PRN
  Administered 2016-05-09: 15 mL via INTRAVENOUS

## 2016-05-11 ENCOUNTER — Other Ambulatory Visit: Payer: Self-pay | Admitting: Oncology

## 2016-05-11 ENCOUNTER — Encounter: Payer: Self-pay | Admitting: *Deleted

## 2016-05-11 NOTE — Progress Notes (Signed)
The results of the MRI was discussed with the patient today. He is aware of new lesions detected. Additional radiation therapy may be warranted and Dr. Lisbeth Renshaw is aware of these results and he is under consideration for possible retreatment. Clinically, he appears to be doing reasonably well without any new neurological symptoms. He still has some unsteadiness and tremors which has not changed dramatically. He denied any falls over the last few days.  He is was also started on Cabometyx and tolerated reasonably fair at this time. He does report some cramps but no diarrhea. We will continue to follow his progress at this time.

## 2016-05-12 ENCOUNTER — Telehealth: Payer: Self-pay | Admitting: Radiation Oncology

## 2016-05-12 ENCOUNTER — Telehealth: Payer: Self-pay | Admitting: *Deleted

## 2016-05-12 ENCOUNTER — Other Ambulatory Visit: Payer: Self-pay | Admitting: Radiation Oncology

## 2016-05-12 DIAGNOSIS — C649 Malignant neoplasm of unspecified kidney, except renal pelvis: Secondary | ICD-10-CM

## 2016-05-12 DIAGNOSIS — C7931 Secondary malignant neoplasm of brain: Secondary | ICD-10-CM

## 2016-05-12 MED ORDER — DEXAMETHASONE 4 MG PO TABS: 4.0000 mg | ORAL_TABLET | Freq: Three times a day (TID) | ORAL | 1 refills | Status: AC

## 2016-05-12 NOTE — Telephone Encounter (Signed)
Returned call to patient's wife Jacob Beck, states he needs a sleeping pill, remeron, but insurance will not pay for it. States she has some unisom in the home, suggested he could take that. It is OTC. Wife tearful, upset d/t patient unable to communicate well. States he has another MRI scheduled and will be on steroids and would not be able to sleep. She knows to call me back if this does not help him.

## 2016-05-12 NOTE — Telephone Encounter (Signed)
I called and left a message for the patient to call me back about recommendations from his MRI results.

## 2016-05-12 NOTE — Progress Notes (Signed)
I spoke with the patient's wife regarding his brain MRI and recommendations for [facility] treatment. We will plan for him to come in to have repeat MRI with 3T technology followed by treatment planning and subsequent therapy. We discussed the role of dexamethasone and I've started him on 4mg  po TID. We discussed GI prophylaxis with PPI or H2 blockers. He will start this back as well.

## 2016-05-14 ENCOUNTER — Other Ambulatory Visit: Payer: Self-pay | Admitting: Radiation Therapy

## 2016-05-14 DIAGNOSIS — C7931 Secondary malignant neoplasm of brain: Secondary | ICD-10-CM

## 2016-05-16 ENCOUNTER — Ambulatory Visit (HOSPITAL_COMMUNITY)
Admission: RE | Admit: 2016-05-16 | Discharge: 2016-05-16 | Disposition: A | Discharge status: Home / Self Care | Payer: Medicare HMO | Source: Ambulatory Visit | Attending: Radiation Oncology | Admitting: Radiation Oncology

## 2016-05-16 DIAGNOSIS — C649 Malignant neoplasm of unspecified kidney, except renal pelvis: Secondary | ICD-10-CM | POA: Diagnosis not present

## 2016-05-16 DIAGNOSIS — C7931 Secondary malignant neoplasm of brain: Secondary | ICD-10-CM | POA: Insufficient documentation

## 2016-05-16 DIAGNOSIS — Z923 Personal history of irradiation: Secondary | ICD-10-CM | POA: Diagnosis not present

## 2016-05-16 MED ORDER — GADOBENATE DIMEGLUMINE 529 MG/ML IV SOLN
15.0000 mL | Freq: Once | INTRAVENOUS | Status: AC | PRN
  Administered 2016-05-16: 15 mL via INTRAVENOUS

## 2016-05-19 ENCOUNTER — Telehealth: Payer: Self-pay | Admitting: Radiation Oncology

## 2016-05-19 DIAGNOSIS — C7931 Secondary malignant neoplasm of brain: Secondary | ICD-10-CM

## 2016-05-19 DIAGNOSIS — C649 Malignant neoplasm of unspecified kidney, except renal pelvis: Secondary | ICD-10-CM

## 2016-05-19 NOTE — Telephone Encounter (Signed)
I spoke with the patient's wife and we discussed her husband's case. He had symptoms of falls, tremors, and blurry vision that prompted his MRI on 05/09/16. Concerned with new disease, we started Dexamethasone 4mg  TID, and we ordered a 3T MRI for SRS protocol. His case was discussed at brain oncology conference at the cancer center after his second MRI, and it was felt that he did not have disease progression or new evidence of disease. No treatment with radiotherapy is recommended. The patient has noticed improvement in some of his symptoms since starting the dexamethasone. Recommendations from conference were to have the patient meet with neurology. We will plan to taper steroids once he's been seen by Neurology. I've requested urgent consultation with one of the neurologists at [facility] who specialize in movement disorders.

## 2016-05-20 ENCOUNTER — Telehealth: Payer: Self-pay | Admitting: *Deleted

## 2016-05-20 NOTE — Telephone Encounter (Signed)
CALLED PATIENT TO INFORM OF APPT. WITH NEUROLOGIST - DR. Rexene Alberts ON 05-26-16 - ARRIVAL TIME - 11 AM , ADDRESS [place], [place]. NO - [phone], SPOKE WITH PATIENT AND HE IS AWARE OF THIS APPT.

## 2016-05-26 ENCOUNTER — Telehealth: Payer: Self-pay

## 2016-05-26 ENCOUNTER — Ambulatory Visit: Payer: Self-pay | Admitting: Neurology

## 2016-05-26 NOTE — Telephone Encounter (Signed)
Thank you, Erasmo Downer for calling patient's wife in the effort to spare patient yet another appointment with a different provider, and copay and redundant office visit. Patient has seen a Movement d/o specialist for the same issue less than 6 months ago; reviewed Dr. Doristine Devoid note and agree with what was d/w patient at the time. Also reviewed chart and recommend FU with Dr. Carles Collet.

## 2016-05-26 NOTE — Telephone Encounter (Signed)
Pt returned my call. I explained to him that Dr. Rexene Alberts reviewed pt's chart and feels that the visit with her today should be deferred to Dr. Carles Collet, since the pt saw Dr. Carles Collet in November for tremors. I also explained that Dr. Rexene Alberts reviewed pt's medical history and would not make any medication changes at this time, and wishes to spare this pt a visit and copay today, given that she will make no further changes, and agrees with what has been done for pt thus far. I suggested, per Dr. Guadelupe Sabin recommendation, that pt follow up with Dr. Carles Collet, or seek care with a neuro oncologist. Pt's wife reports that they don't wish to go to [facility] because they don't like [place] and "no one knows what to do for the tremors, but whatever." Pt did agree to cancel the appt with Dr. Rexene Alberts today.

## 2016-05-26 NOTE — Telephone Encounter (Signed)
I called pt, per Dr. Guadelupe Sabin request to discuss this appt today. No answer, left a message asking him to call me back.  Dr. Rexene Alberts asked me to call and discuss with the pt that he should see Dr. Carles Collet for tremors. He has seen Dr. Carles Collet in November of 2017 for tremors. Dr. Rexene Alberts reviewed pt's medical history and agrees what has been done and will not change or add any new medications and is unsure of how to treat tremors in this pt, given this pt's medical history. Dr. Rexene Alberts wants to spare this pt a visit/copay with her today since she does not have anything else to offer this pt.

## 2016-06-03 DIAGNOSIS — W19XXXA Unspecified fall, initial encounter: Secondary | ICD-10-CM | POA: Diagnosis not present

## 2016-06-03 DIAGNOSIS — S40011A Contusion of right shoulder, initial encounter: Secondary | ICD-10-CM | POA: Diagnosis not present

## 2016-06-05 ENCOUNTER — Ambulatory Visit (HOSPITAL_BASED_OUTPATIENT_CLINIC_OR_DEPARTMENT_OTHER): Payer: Medicare HMO | Admitting: Oncology

## 2016-06-05 ENCOUNTER — Telehealth: Payer: Self-pay | Admitting: Oncology

## 2016-06-05 ENCOUNTER — Other Ambulatory Visit (HOSPITAL_BASED_OUTPATIENT_CLINIC_OR_DEPARTMENT_OTHER): Payer: Medicare HMO

## 2016-06-05 ENCOUNTER — Other Ambulatory Visit: Payer: Self-pay | Admitting: Oncology

## 2016-06-05 ENCOUNTER — Other Ambulatory Visit: Payer: Self-pay | Admitting: *Deleted

## 2016-06-05 DIAGNOSIS — M791 Myalgia: Secondary | ICD-10-CM | POA: Diagnosis not present

## 2016-06-05 DIAGNOSIS — C7931 Secondary malignant neoplasm of brain: Secondary | ICD-10-CM | POA: Diagnosis not present

## 2016-06-05 DIAGNOSIS — R270 Ataxia, unspecified: Secondary | ICD-10-CM

## 2016-06-05 DIAGNOSIS — R251 Tremor, unspecified: Secondary | ICD-10-CM

## 2016-06-05 DIAGNOSIS — C642 Malignant neoplasm of left kidney, except renal pelvis: Secondary | ICD-10-CM

## 2016-06-05 DIAGNOSIS — C649 Malignant neoplasm of unspecified kidney, except renal pelvis: Secondary | ICD-10-CM

## 2016-06-05 LAB — COMPREHENSIVE METABOLIC PANEL WITH GFR
ALT: 42 U/L (ref 0–55)
AST: 30 U/L (ref 5–34)
Albumin: 2.7 g/dL — ABNORMAL LOW (ref 3.5–5.0)
Alkaline Phosphatase: 153 U/L — ABNORMAL HIGH (ref 40–150)
Anion Gap: 13 meq/L — ABNORMAL HIGH (ref 3–11)
BUN: 29.4 mg/dL — ABNORMAL HIGH (ref 7.0–26.0)
CO2: 24 meq/L (ref 22–29)
Calcium: 8.9 mg/dL (ref 8.4–10.4)
Chloride: 106 meq/L (ref 98–109)
Creatinine: 0.7 mg/dL (ref 0.7–1.3)
EGFR: >90 ml/min/1.73 m2 (ref >90)
Glucose: 127 mg/dL (ref 70–140)
Potassium: 4.3 meq/L (ref 3.5–5.1)
Sodium: 143 meq/L (ref 136–145)
Total Bilirubin: 0.3 mg/dL (ref 0.20–1.20)
Total Protein: 6.8 g/dL (ref 6.4–8.3)

## 2016-06-05 LAB — CBC WITH DIFFERENTIAL/PLATELET
BASO%: 0.7 % (ref 0.0–2.0)
Basophils Absolute: 0 10*3/uL (ref 0.0–0.1)
EOS%: 2.2 % (ref 0.0–7.0)
Eosinophils Absolute: 0.1 10*3/uL (ref 0.0–0.5)
HEMATOCRIT: 40.2 % (ref 38.4–49.9)
HEMOGLOBIN: 12.6 g/dL — AB (ref 13.0–17.1)
LYMPH#: 2.1 10*3/uL (ref 0.9–3.3)
LYMPH%: 41.1 % (ref 14.0–49.0)
MCH: 26.5 pg — ABNORMAL LOW (ref 27.2–33.4)
MCHC: 31.4 g/dL — ABNORMAL LOW (ref 32.0–36.0)
MCV: 84.3 fL (ref 79.3–98.0)
MONO#: 0.1 10*3/uL (ref 0.1–0.9)
MONO%: 2.5 % (ref 0.0–14.0)
NEUT%: 53.5 % (ref 39.0–75.0)
NEUTROS ABS: 2.8 10*3/uL (ref 1.5–6.5)
Platelets: 162 10*3/uL (ref 140–400)
RBC: 4.77 10*6/uL (ref 4.20–5.82)
RDW: 25.2 % — AB (ref 11.0–14.6)
WBC: 5.2 10*3/uL (ref 4.0–10.3)

## 2016-06-05 MED ORDER — OXYCODONE HCL 5 MG PO TABS: 10.0000 mg | ORAL_TABLET | Freq: Four times a day (QID) | ORAL | 0 refills | Status: DC | PRN

## 2016-06-05 MED ORDER — PANTOPRAZOLE SODIUM 40 MG PO TBEC: 40.0000 mg | DELAYED_RELEASE_TABLET | Freq: Two times a day (BID) | ORAL | 6 refills | Status: AC

## 2016-06-05 MED ORDER — CABOZANTINIB S-MALATE 40 MG PO TABS: 40.0000 mg | ORAL_TABLET | Freq: Every day | ORAL | 0 refills | Status: DC

## 2016-06-05 MED ORDER — TEMAZEPAM 30 MG PO CAPS: 30.0000 mg | ORAL_CAPSULE | Freq: Every evening | ORAL | 0 refills | Status: DC | PRN

## 2016-06-05 MED FILL — CABOMETYX 40 MG TABLET: 40 | 30 days supply | Qty: 30 | Fill #0

## 2016-06-05 NOTE — Telephone Encounter (Signed)
Faxed script for cabometyx to [facility] outpatient pharmacy, per dr Hazeline Junker request. Refilled script for restoril at [facility] per patient's wife's request.

## 2016-06-05 NOTE — Telephone Encounter (Signed)
Appointments scheduled per 5.4.18 LOS. Patient given AVS report and calendars with future scheduled appointments. °

## 2016-06-05 NOTE — Progress Notes (Signed)
Hematology and Oncology Follow Up Visit  Jacob Beck [id number] [birth year] 66 y.o. 06/05/2016 9:52 AM   Principle Diagnosis: 66 year old gentleman with renal cell carcinoma diagnosed in April 2016. He presented with a large tumor on the left kidney measuring 15 cm and extending into the retroperitoneum causing proximal renal vein thrombosis.  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   Prior Therapy:  Attempted surgical resection on 06/01/2014 that was unsuccessful given the size of tumor. Whole brain radiation therapy completed on 10/04/2015. Votrient 800 mg daily started on 06/07/2014. He is currently receiving 600 mg daily for better tolerance.  Current therapy:   Cabometyx 40 mg daily started in April 2018.   Interim History: Mr. Higinbotham presents today for a follow-up visit with his wife. Since the last visit, he reports some improvement in his health. He started Cabometyx and took it for the last 30 days without complications. He reports better tolerance compared to Votrient. His energy and performance status slightly improved. His appetite spots same although lost a few pounds. He denied any diarrhea, hand-foot syndrome, mouth sores or ulcers. His blood pressure remained within normal range.   He continues to have issues with tremors and unsteadiness although he denied any falls or syncope. He is able to ambulate without major difficulties but very slow with shuffling gait. He was supposed to see a different to neurology specialist but his appointment was canceled.    He does not report any fevers, chills or sweats. He does not report any chest pain, palpitation, orthopnea, leg edema. He does not report any cough, hemoptysis, hematemesis. He does not report any early satiety or abdominal distention. He does not report any skeletal pains to his back pain shoulder pain or hip pain. He does not report any lymphadenopathy or petechiae. Remainder of review of systems unremarkable.    Medications: I have reviewed the patient's current medications.  Current Outpatient Prescriptions  Medication Sig Dispense Refill  . cabozantinib S-Malate (CABOMETYX) 40 MG TABS Take 40 mg by mouth daily. 30 tablet 0  . dexamethasone (DECADRON) 4 MG tablet Take 1 tablet (4 mg total) by mouth 3 (three) times daily. 90 tablet 1  . loperamide (IMODIUM) 2 MG capsule Take 2 mg by mouth as needed for diarrhea or loose stools.    . Multiple Vitamins-Minerals (MULTIVITAMIN WITH MINERALS) tablet Take 1 tablet by mouth daily.    . ondansetron (ZOFRAN) 4 MG tablet Take 1 tablet (4 mg total) by mouth every 6 (six) hours as needed for nausea. (Patient not taking: Reported on 05/12/2016) 20 tablet 0  . oxyCODONE (OXY IR/ROXICODONE) 5 MG immediate release tablet Take 2 tablets (10 mg total) by mouth every 6 (six) hours as needed for severe pain. 90 tablet 0  . pantoprazole (PROTONIX) 40 MG tablet Take 1 tablet (40 mg total) by mouth 2 (two) times daily. 60 tablet 6  . polyethylene glycol (MIRALAX / GLYCOLAX) packet Take 17 g by mouth daily as needed for mild constipation. 14 each 0  . Probiotic Product (PROBIOTIC DAILY PO) Take 1 tablet by mouth daily.    . temazepam (RESTORIL) 30 MG capsule Take 1 capsule (30 mg total) by mouth at bedtime as needed for sleep. 30 capsule 1   No current facility-administered medications for this visit.      Allergies:  No Known Allergies  Past Medical History, Surgical history, Social history, and Family History were reviewed and updated.   Physical Exam: Blood pressure (!) 141/68, pulse 69, temperature  97.8 F (36.6 C), temperature source Oral, resp. rate 18, height 6' (1.829 m), weight 166 lb 3.2 oz (75.4 kg), SpO2 100 %. ECOG: 1 General appearance: Comfortable-appearing gentleman without distress. Head: Normocephalic, without obvious abnormality. No oral sores or lesions. Neck: no adenopathy no thyroid masses. Lymph nodes: Cervical, supraclavicular, and axillary  nodes normal. Heart:regular rate and rhythm, S1, S2 normal, no murmur, click, rub or gallop Lung:chest clear, no wheezing, rales, normal symmetric air entry Abdomin: soft, non-tender, without masses or organomegaly no shifting dullness or ascites. EXT:no erythema, induration, or nodules Neurological examination: He is ambulating without any difficulties. Resting tremor noted today.   Lab Results: Lab Results  Component Value Date   WBC 5.2 06/05/2016   HGB 12.6 (L) 06/05/2016   HCT 40.2 06/05/2016   MCV 84.3 06/05/2016   PLT 162 06/05/2016     Chemistry      Component Value Date/Time   NA 143 06/05/2016 0851   K 4.3 06/05/2016 0851   CL 102 03/28/2016 0529   CO2 24 06/05/2016 0851   BUN 29.4 (H) 06/05/2016 0851   CREATININE 0.7 06/05/2016 0851      Component Value Date/Time   CALCIUM 8.9 06/05/2016 0851   ALKPHOS 153 (H) 06/05/2016 0851   AST 30 06/05/2016 0851   ALT 42 06/05/2016 0851   BILITOT 0.30 06/05/2016 315      66 year old gentleman with the following issues:  1.Renal cell carcinoma presented with a large mass measuring 15.0 x 14.1 x 9.9 cm left renal mass extending off the upper pole of the left kidney into the retroperitoneum. He has also evidence of lymphadenopathy and small pulmonary nodules. He presented with abdominal pain and 20 pound weight loss. This is a biopsy proven to be renal cell carcinoma with a biopsy done on 05/04/2014. Attempted surgical resection for debulking purposes was unsuccessful.  He is S/P Votrient 600 mg daily with therapy discontinued in March 2018 because of progression of disease.  CT scan on 04/24/2016 showed progression of disease and was started Cabometyx after that.  He continues to take 40 mg dosing without complications and the plan is to continue the same dose and schedule. We will repeat CT scan in 2 months.   2. Brain metastasis: Detected on MRI on 09/05/2015. He is status post whole brain radiation therapy at that time.  Repeat MRI in April 2018 did not show any progression of disease.  3. Diffuse body pain: She is currently on oxycodone which helped his body pain. This was refilled for him today. He is to use that alternating with ibuprofen.  4. Nausea, dyspepsia and epigastric abdominal pain: Improved since the last visit.  5. Tremors and unsteadiness with ataxia: This could be exacerbated by his alcohol consumption appears to be stable at this time. He is interested to having another opinion from a different neurologist.  6. Liver function surveillance: Continues to be within normal range.  7. Prognosis: This was reviewed again with the patient and his wife. He understands he is an incurable malignancy especially in the setting of metastatic disease to the brain. His disease can be palliated however with the current treatment with expected overall survival likely in the double digit months rather than years. If his neck CT scan showed significant improvement in his malignancy, with slight expectancy would certainly improve. If his neck CT scan showed progression of disease, certainly his prognosis will be worse and would be approaching hospice at that time.  8. Hypercalcemia: His calcium  has normalized at this time.  9. Follow-up: Will be in 4 weeks.    [facility], MD 5/4/20189:52 AM

## 2016-06-08 ENCOUNTER — Telehealth: Payer: Self-pay | Admitting: *Deleted

## 2016-06-08 ENCOUNTER — Encounter: Payer: Self-pay | Admitting: *Deleted

## 2016-06-08 NOTE — Telephone Encounter (Signed)
Wife Jacob Beck calling to request a referral to [facility], for a neurology consult, d/t his shaking and difficulty walking.appt made for July 26,2018, with dr. haq.  He will be added to a waiting list if something becomes available sooner. Left message for wife Jacob Beck to call me.

## 2016-07-02 ENCOUNTER — Other Ambulatory Visit: Payer: Self-pay | Admitting: Oncology

## 2016-07-02 ENCOUNTER — Other Ambulatory Visit: Payer: Self-pay | Admitting: *Deleted

## 2016-07-02 MED ORDER — CABOZANTINIB S-MALATE 40 MG PO TABS: 1.0000 | ORAL_TABLET | Freq: Every day | ORAL | 0 refills | Status: DC

## 2016-07-02 MED ORDER — CABOZANTINIB S-MALATE 40 MG PO TABS: 40.0000 mg | ORAL_TABLET | Freq: Every day | ORAL | 0 refills | Status: DC

## 2016-07-02 MED FILL — CABOMETYX 40 MG TABLET: 40 | 30 days supply | Qty: 30 | Fill #0

## 2016-07-14 ENCOUNTER — Other Ambulatory Visit: Payer: Self-pay | Admitting: *Deleted

## 2016-07-14 ENCOUNTER — Telehealth: Payer: Self-pay | Admitting: Oncology

## 2016-07-14 ENCOUNTER — Other Ambulatory Visit (HOSPITAL_BASED_OUTPATIENT_CLINIC_OR_DEPARTMENT_OTHER): Payer: Medicare HMO

## 2016-07-14 ENCOUNTER — Ambulatory Visit (HOSPITAL_BASED_OUTPATIENT_CLINIC_OR_DEPARTMENT_OTHER): Payer: Medicare HMO | Admitting: Oncology

## 2016-07-14 VITALS — BP 154/68 | HR 77 | Temp 98.4°F | Resp 20 | Ht 72.0 in | Wt 177.6 lb

## 2016-07-14 DIAGNOSIS — C642 Malignant neoplasm of left kidney, except renal pelvis: Secondary | ICD-10-CM | POA: Diagnosis not present

## 2016-07-14 DIAGNOSIS — R591 Generalized enlarged lymph nodes: Secondary | ICD-10-CM

## 2016-07-14 DIAGNOSIS — C7931 Secondary malignant neoplasm of brain: Secondary | ICD-10-CM

## 2016-07-14 DIAGNOSIS — C649 Malignant neoplasm of unspecified kidney, except renal pelvis: Secondary | ICD-10-CM

## 2016-07-14 DIAGNOSIS — R251 Tremor, unspecified: Secondary | ICD-10-CM | POA: Diagnosis not present

## 2016-07-14 DIAGNOSIS — R270 Ataxia, unspecified: Secondary | ICD-10-CM

## 2016-07-14 DIAGNOSIS — R918 Other nonspecific abnormal finding of lung field: Secondary | ICD-10-CM | POA: Diagnosis not present

## 2016-07-14 DIAGNOSIS — M791 Myalgia: Secondary | ICD-10-CM | POA: Diagnosis not present

## 2016-07-14 LAB — BASIC METABOLIC PANEL
Anion Gap: 9 mEq/L (ref 3–11)
BUN: 17.5 mg/dL (ref 7.0–26.0)
CHLORIDE: 100 meq/L (ref 98–109)
CO2: 29 meq/L (ref 22–29)
CREATININE: 0.8 mg/dL (ref 0.7–1.3)
Calcium: 9.2 mg/dL (ref 8.4–10.4)
EGFR: >90 mL/min/{1.73_m2} (ref >90)
GLUCOSE: 119 mg/dL (ref 70–140)
Potassium: 5 mEq/L (ref 3.5–5.1)
Sodium: 138 mEq/L (ref 136–145)

## 2016-07-14 MED ORDER — TEMAZEPAM 30 MG PO CAPS: ORAL_CAPSULE | ORAL | 0 refills | Status: AC

## 2016-07-14 MED ORDER — OXYCODONE HCL 5 MG PO TABS: 10.0000 mg | ORAL_TABLET | Freq: Four times a day (QID) | ORAL | 0 refills | Status: AC | PRN

## 2016-07-14 NOTE — Progress Notes (Signed)
Hematology and Oncology Follow Up Visit  Jacob Beck [id number] March 11, 1950 66 y.o. 07/14/2016 10:32 AM   Principle Diagnosis: 66 year old gentleman with renal cell carcinoma diagnosed in April 2016. He presented with a large tumor on the left kidney measuring 15 cm and extending into the retroperitoneum causing proximal renal vein thrombosis.  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   Prior Therapy:  Attempted surgical resection on 06/01/2014 that was unsuccessful given the size of tumor. Whole brain radiation therapy completed on 10/04/2015. Votrient 800 mg daily started on 06/07/2014. He is currently receiving 600 mg daily for better tolerance.  Current therapy:   Cabometyx 40 mg daily started in April 2018.   Interim History: Jacob Beck presents today for a follow-up visit with his wife. Since the last visit, he reports no changes or complaints. He continues to take Cabometyx without complications. His energy and performance status slightly improved. His appetite is much better and had gained more weight. He denied any diarrhea, hand-foot syndrome, mouth sores or ulcers. His blood pressure remained within normal range.  He is currently on steroid taper taking 4 mg every other day and will discontinue it in the near future. He still has some tremors and unsteadiness but has not reported any recent falls or syncope. He denied any headaches or seizure activity. His quality of life remains reasonable and slightly improved compared to when he was on Votrient. He does take oxycodone periodically for body pains which have helped his symptoms.    He does not report any fevers, chills or sweats. He does not report any chest pain, palpitation, orthopnea, leg edema. He does not report any cough, hemoptysis, hematemesis. He does not report any early satiety or abdominal distention. He does not report any skeletal pains to his back pain shoulder pain or hip pain. He does not report any  lymphadenopathy or petechiae. Remainder of review of systems unremarkable.   Medications: I have reviewed the patient's current medications.  Current Outpatient Prescriptions  Medication Sig Dispense Refill  . cabozantinib S-Malate 40 MG TABS Take 1 tablet by mouth daily. 30 tablet 0  . dexamethasone (DECADRON) 4 MG tablet Take 1 tablet (4 mg total) by mouth 3 (three) times daily. 90 tablet 1  . loperamide (IMODIUM) 2 MG capsule Take 2 mg by mouth as needed for diarrhea or loose stools.    . Multiple Vitamins-Minerals (MULTIVITAMIN WITH MINERALS) tablet Take 1 tablet by mouth daily.    . ondansetron (ZOFRAN) 4 MG tablet Take 1 tablet (4 mg total) by mouth every 6 (six) hours as needed for nausea. (Patient not taking: Reported on 05/12/2016) 20 tablet 0  . oxyCODONE (OXY IR/ROXICODONE) 5 MG immediate release tablet Take 2 tablets (10 mg total) by mouth every 6 (six) hours as needed for severe pain. 90 tablet 0  . pantoprazole (PROTONIX) 40 MG tablet Take 1 tablet (40 mg total) by mouth 2 (two) times daily. 60 tablet 6  . polyethylene glycol (MIRALAX / GLYCOLAX) packet Take 17 g by mouth daily as needed for mild constipation. 14 each 0  . Probiotic Product (PROBIOTIC DAILY PO) Take 1 tablet by mouth daily.    . temazepam (RESTORIL) 30 MG capsule TAKE 1 CAPSULE BY MOUTH DAILY AT BEDTIME AS NEEDED FOR SLEEP  30 capsule 0   No current facility-administered medications for this visit.      Allergies:  No Known Allergies  Past Medical History, Surgical history, Social history, and Family History were reviewed and updated.  Physical Exam: Blood pressure (!) 154/68, pulse 77, temperature 98.4 F (36.9 C), temperature source Oral, resp. rate 20, height 6' (1.829 m), weight 177 lb 9.6 oz (80.6 kg), SpO2 100 %. ECOG: 1 General appearance: Alert, awake gentleman without distress. Head: Normocephalic, without obvious abnormality. No oral sores or lesions. Neck: no adenopathy no thyroid  masses. Lymph nodes: Cervical, supraclavicular, and axillary nodes normal. Heart:regular rate and rhythm, S1, S2 normal, no murmur, click, rub or gallop Lung:chest clear, no wheezing, rales, normal symmetric air entry Abdomin: soft, non-tender, without masses or organomegaly no rebound or guarding. EXT:no erythema, induration, or nodules Neurological examination: No deficits. Ambulating without difficulties.   Lab Results: Lab Results  Component Value Date   WBC 5.2 06/05/2016   HGB 12.6 (L) 06/05/2016   HCT 40.2 06/05/2016   MCV 84.3 06/05/2016   PLT 162 06/05/2016     Chemistry      Component Value Date/Time   NA 143 06/05/2016 0851   K 4.3 06/05/2016 0851   CL 102 03/28/2016 0529   CO2 24 06/05/2016 0851   BUN 29.4 (H) 06/05/2016 0851   CREATININE 0.7 06/05/2016 0851      Component Value Date/Time   CALCIUM 8.9 06/05/2016 0851   ALKPHOS 153 (H) 06/05/2016 0851   AST 30 06/05/2016 0851   ALT 42 06/05/2016 0851   BILITOT 0.30 06/05/2016 6723      66 year old gentleman with the following issues:  1.Renal cell carcinoma presented with a large mass measuring 15.0 x 14.1 x 9.9 cm left renal mass extending off the upper pole of the left kidney into the retroperitoneum. He has also evidence of lymphadenopathy and small pulmonary nodules. He presented with abdominal pain and 20 pound weight loss. This is a biopsy proven to be renal cell carcinoma with a biopsy done on 05/04/2014. Attempted surgical resection for debulking purposes was unsuccessful.  He is S/P Votrient 600 mg daily with therapy discontinued in March 2018 because of progression of disease.  CT scan on 04/24/2016 showed progression of disease and was started Cabometyx after that.  He continues to take 40 mg Without complications. I have recommended continuing the same dose and schedule. We will obtain imaging studies in July 2018 for staging purposes.  2. Brain metastasis: Detected on MRI on 09/05/2015. He is  status post whole brain radiation therapy at that time. Repeat MRI in April 2018 did not show any progression of disease.  3. Diffuse body pain: She is currently on oxycodone which helped his body pain. This was refilled for him today.   4. Nausea, dyspepsia and epigastric abdominal pain: Improved since the last visit.  5. Tremors and unsteadiness with ataxia: This could be exacerbated by his alcohol consumption appears to be stable at this time. He is interested to having another opinion from a different neurologist.  6. Liver function surveillance: Continues to be within normal range.  7. Prognosis: His disease is incurable and he is receiving treatment for palliative purposes. His performance status is adequate is agreeable to continue with aggressive therapy.  8. Hypercalcemia: His calcium has normalized at this time.  9. Follow-up: Will be in 4 weeks.    [facility], MD 6/12/201810:32 AM

## 2016-07-14 NOTE — Telephone Encounter (Signed)
Gave patient Avs and calender per 6/12 los - [facility] to contact patient with CT schedule

## 2016-08-07 ENCOUNTER — Other Ambulatory Visit: Payer: Self-pay | Admitting: Oncology

## 2016-08-07 ENCOUNTER — Encounter: Payer: Self-pay | Admitting: Oncology

## 2016-08-07 ENCOUNTER — Other Ambulatory Visit: Payer: Self-pay | Admitting: Pharmacist

## 2016-08-07 DIAGNOSIS — C649 Malignant neoplasm of unspecified kidney, except renal pelvis: Secondary | ICD-10-CM

## 2016-08-07 MED ORDER — CABOZANTINIB S-MALATE 40 MG PO TABS: 1.0000 | ORAL_TABLET | Freq: Every day | ORAL | 0 refills | Status: AC

## 2016-08-07 MED FILL — CABOMETYX 40 MG TABLET: 40 | 30 days supply | Qty: 30 | Fill #0

## 2016-08-10 ENCOUNTER — Emergency Department (HOSPITAL_COMMUNITY): Payer: Medicare HMO

## 2016-08-10 ENCOUNTER — Inpatient Hospital Stay (HOSPITAL_COMMUNITY)
Admission: EM | Admit: 2016-08-10 | Discharge: 2016-09-02 | DRG: 296 | Disposition: E | Payer: Medicare HMO | Attending: Pulmonary Disease | Admitting: Pulmonary Disease

## 2016-08-10 ENCOUNTER — Encounter (HOSPITAL_COMMUNITY): Payer: Self-pay | Admitting: Neurology

## 2016-08-10 DIAGNOSIS — Z9221 Personal history of antineoplastic chemotherapy: Secondary | ICD-10-CM

## 2016-08-10 DIAGNOSIS — E872 Acidosis, unspecified: Secondary | ICD-10-CM

## 2016-08-10 DIAGNOSIS — S12591A Other nondisplaced fracture of sixth cervical vertebra, initial encounter for closed fracture: Secondary | ICD-10-CM | POA: Diagnosis not present

## 2016-08-10 DIAGNOSIS — G931 Anoxic brain damage, not elsewhere classified: Secondary | ICD-10-CM | POA: Diagnosis present

## 2016-08-10 DIAGNOSIS — Z79899 Other long term (current) drug therapy: Secondary | ICD-10-CM | POA: Diagnosis not present

## 2016-08-10 DIAGNOSIS — W19XXXA Unspecified fall, initial encounter: Secondary | ICD-10-CM | POA: Diagnosis present

## 2016-08-10 DIAGNOSIS — I6200 Nontraumatic subdural hemorrhage, unspecified: Secondary | ICD-10-CM | POA: Diagnosis not present

## 2016-08-10 DIAGNOSIS — I10 Essential (primary) hypertension: Secondary | ICD-10-CM | POA: Diagnosis present

## 2016-08-10 DIAGNOSIS — R109 Unspecified abdominal pain: Secondary | ICD-10-CM

## 2016-08-10 DIAGNOSIS — R402212 Coma scale, best verbal response, none, at arrival to emergency department: Secondary | ICD-10-CM | POA: Diagnosis present

## 2016-08-10 DIAGNOSIS — I469 Cardiac arrest, cause unspecified: Secondary | ICD-10-CM | POA: Diagnosis not present

## 2016-08-10 DIAGNOSIS — S0010XA Contusion of unspecified eyelid and periocular area, initial encounter: Secondary | ICD-10-CM | POA: Diagnosis present

## 2016-08-10 DIAGNOSIS — Y92009 Unspecified place in unspecified non-institutional (private) residence as the place of occurrence of the external cause: Secondary | ICD-10-CM | POA: Diagnosis not present

## 2016-08-10 DIAGNOSIS — R0689 Other abnormalities of breathing: Secondary | ICD-10-CM | POA: Diagnosis present

## 2016-08-10 DIAGNOSIS — I160 Hypertensive urgency: Secondary | ICD-10-CM | POA: Diagnosis present

## 2016-08-10 DIAGNOSIS — R402112 Coma scale, eyes open, never, at arrival to emergency department: Secondary | ICD-10-CM | POA: Diagnosis not present

## 2016-08-10 DIAGNOSIS — C7951 Secondary malignant neoplasm of bone: Secondary | ICD-10-CM | POA: Diagnosis not present

## 2016-08-10 DIAGNOSIS — R0902 Hypoxemia: Secondary | ICD-10-CM | POA: Diagnosis not present

## 2016-08-10 DIAGNOSIS — K219 Gastro-esophageal reflux disease without esophagitis: Secondary | ICD-10-CM | POA: Diagnosis present

## 2016-08-10 DIAGNOSIS — C642 Malignant neoplasm of left kidney, except renal pelvis: Secondary | ICD-10-CM | POA: Diagnosis present

## 2016-08-10 DIAGNOSIS — G253 Myoclonus: Secondary | ICD-10-CM | POA: Diagnosis present

## 2016-08-10 DIAGNOSIS — Z923 Personal history of irradiation: Secondary | ICD-10-CM | POA: Diagnosis not present

## 2016-08-10 DIAGNOSIS — I619 Nontraumatic intracerebral hemorrhage, unspecified: Secondary | ICD-10-CM | POA: Diagnosis not present

## 2016-08-10 DIAGNOSIS — I609 Nontraumatic subarachnoid hemorrhage, unspecified: Secondary | ICD-10-CM | POA: Diagnosis not present

## 2016-08-10 DIAGNOSIS — Z9889 Other specified postprocedural states: Secondary | ICD-10-CM

## 2016-08-10 DIAGNOSIS — Z87891 Personal history of nicotine dependence: Secondary | ICD-10-CM

## 2016-08-10 DIAGNOSIS — Z8042 Family history of malignant neoplasm of prostate: Secondary | ICD-10-CM

## 2016-08-10 DIAGNOSIS — R739 Hyperglycemia, unspecified: Secondary | ICD-10-CM | POA: Diagnosis present

## 2016-08-10 DIAGNOSIS — Z515 Encounter for palliative care: Secondary | ICD-10-CM | POA: Diagnosis present

## 2016-08-10 DIAGNOSIS — E871 Hypo-osmolality and hyponatremia: Secondary | ICD-10-CM | POA: Diagnosis present

## 2016-08-10 DIAGNOSIS — S065X0A Traumatic subdural hemorrhage without loss of consciousness, initial encounter: Secondary | ICD-10-CM | POA: Diagnosis not present

## 2016-08-10 DIAGNOSIS — Z8249 Family history of ischemic heart disease and other diseases of the circulatory system: Secondary | ICD-10-CM

## 2016-08-10 DIAGNOSIS — S065X9A Traumatic subdural hemorrhage with loss of consciousness of unspecified duration, initial encounter: Secondary | ICD-10-CM | POA: Diagnosis not present

## 2016-08-10 DIAGNOSIS — S199XXA Unspecified injury of neck, initial encounter: Secondary | ICD-10-CM | POA: Diagnosis not present

## 2016-08-10 DIAGNOSIS — R402312 Coma scale, best motor response, none, at arrival to emergency department: Secondary | ICD-10-CM | POA: Diagnosis not present

## 2016-08-10 DIAGNOSIS — S066X9A Traumatic subarachnoid hemorrhage with loss of consciousness of unspecified duration, initial encounter: Secondary | ICD-10-CM | POA: Diagnosis not present

## 2016-08-10 DIAGNOSIS — C7931 Secondary malignant neoplasm of brain: Secondary | ICD-10-CM | POA: Diagnosis present

## 2016-08-10 DIAGNOSIS — S06339A Contusion and laceration of cerebrum, unspecified, with loss of consciousness of unspecified duration, initial encounter: Secondary | ICD-10-CM

## 2016-08-10 DIAGNOSIS — S06369A Traumatic hemorrhage of cerebrum, unspecified, with loss of consciousness of unspecified duration, initial encounter: Secondary | ICD-10-CM

## 2016-08-10 DIAGNOSIS — Z833 Family history of diabetes mellitus: Secondary | ICD-10-CM

## 2016-08-10 DIAGNOSIS — J810 Acute pulmonary edema: Secondary | ICD-10-CM | POA: Diagnosis not present

## 2016-08-10 DIAGNOSIS — S12501A Unspecified nondisplaced fracture of sixth cervical vertebra, initial encounter for closed fracture: Secondary | ICD-10-CM | POA: Diagnosis not present

## 2016-08-10 LAB — COMPREHENSIVE METABOLIC PANEL
ALBUMIN: 2.2 g/dL — AB (ref 3.5–5.0)
ALK PHOS: 107 U/L (ref 38–126)
ALT: 64 U/L — ABNORMAL HIGH (ref 17–63)
AST: 104 U/L — AB (ref 15–41)
Anion gap: 17 — ABNORMAL HIGH (ref 5–15)
BILIRUBIN TOTAL: 0.5 mg/dL (ref 0.3–1.2)
CALCIUM: 7.4 mg/dL — AB (ref 8.9–10.3)
CO2: 15 mmol/L — ABNORMAL LOW (ref 22–32)
CREATININE: 0.62 mg/dL (ref 0.61–1.24)
Chloride: 83 mmol/L — ABNORMAL LOW (ref 101–111)
GFR calc Af Amer: >60 mL/min (ref >60)
GFR calc non Af Amer: >60 mL/min (ref >60)
Glucose, Bld: 204 mg/dL — ABNORMAL HIGH (ref 65–99)
Potassium: 3.5 mmol/L (ref 3.5–5.1)
Sodium: 115 mmol/L — CL (ref 135–145)
TOTAL PROTEIN: 5.5 g/dL — AB (ref 6.5–8.1)

## 2016-08-10 LAB — TYPE AND SCREEN
ABO/RH(D): A POS
Antibody Screen: NEGATIVE

## 2016-08-10 LAB — I-STAT ARTERIAL BLOOD GAS, ED
ACID-BASE DEFICIT: 5 mmol/L — AB (ref 0.0–2.0)
BICARBONATE: 19 mmol/L — AB (ref 20.0–28.0)
O2 SAT: 100 %
PH ART: 7.371 (ref 7.350–7.450)
TCO2: 20 mmol/L (ref 0–100)
pCO2 arterial: 32.8 mmHg (ref 32.0–48.0)
pO2, Arterial: 386 mmHg — ABNORMAL HIGH (ref 83.0–108.0)

## 2016-08-10 LAB — URINALYSIS, ROUTINE W REFLEX MICROSCOPIC
Bacteria, UA: NONE SEEN
Bilirubin Urine: NEGATIVE
GLUCOSE, UA: 150 mg/dL — AB
KETONES UR: NEGATIVE mg/dL
LEUKOCYTES UA: NEGATIVE
NITRITE: NEGATIVE
PH: 6 (ref 5.0–8.0)
Protein, ur: 30 mg/dL — AB
Specific Gravity, Urine: 1.006 (ref 1.005–1.030)
Squamous Epithelial / LPF: NONE SEEN

## 2016-08-10 LAB — SALICYLATE LEVEL

## 2016-08-10 LAB — I-STAT TROPONIN, ED: TROPONIN I, POC: 0 ng/mL (ref 0.00–0.08)

## 2016-08-10 LAB — RAPID URINE DRUG SCREEN, HOSP PERFORMED
Amphetamines: NOT DETECTED
BENZODIAZEPINES: POSITIVE — AB
Barbiturates: NOT DETECTED
Cocaine: NOT DETECTED
Opiates: NOT DETECTED
Tetrahydrocannabinol: NOT DETECTED

## 2016-08-10 LAB — ACETAMINOPHEN LEVEL

## 2016-08-10 LAB — CORTISOL: Cortisol, Plasma: 10 ug/dL

## 2016-08-10 LAB — GLUCOSE, CAPILLARY
GLUCOSE-CAPILLARY: 169 mg/dL — AB (ref 65–99)
Glucose-Capillary: 115 mg/dL — ABNORMAL HIGH (ref 65–99)

## 2016-08-10 LAB — OSMOLALITY, URINE: Osmolality, Ur: 308 mosm/kg (ref 300–900)

## 2016-08-10 LAB — CBC WITH DIFFERENTIAL/PLATELET
BASOS ABS: 0 10*3/uL (ref 0.0–0.1)
Basophils Relative: 0 %
EOS ABS: 0 10*3/uL (ref 0.0–0.7)
Eosinophils Relative: 0 %
HCT: 34.2 % — ABNORMAL LOW (ref 39.0–52.0)
Hemoglobin: 11 g/dL — ABNORMAL LOW (ref 13.0–17.0)
LYMPHS ABS: 2.2 10*3/uL (ref 0.7–4.0)
Lymphocytes Relative: 40 %
MCH: 29.6 pg (ref 26.0–34.0)
MCHC: 32.2 g/dL (ref 30.0–36.0)
MCV: 91.9 fL (ref 78.0–100.0)
Monocytes Absolute: 0.4 10*3/uL (ref 0.1–1.0)
Monocytes Relative: 8 %
NEUTROS ABS: 3 10*3/uL (ref 1.7–7.7)
Neutrophils Relative %: 52 %
PLATELETS: 313 10*3/uL (ref 150–400)
RBC: 3.72 MIL/uL — ABNORMAL LOW (ref 4.22–5.81)
RDW: 24.8 % — AB (ref 11.5–15.5)
WBC: 5.6 10*3/uL (ref 4.0–10.5)

## 2016-08-10 LAB — BASIC METABOLIC PANEL
ANION GAP: 16 — AB (ref 5–15)
BUN: 6 mg/dL (ref 6–20)
CALCIUM: 7.7 mg/dL — AB (ref 8.9–10.3)
CO2: 20 mmol/L — AB (ref 22–32)
Chloride: 81 mmol/L — ABNORMAL LOW (ref 101–111)
Creatinine, Ser: 0.84 mg/dL (ref 0.61–1.24)
Glucose, Bld: 156 mg/dL — ABNORMAL HIGH (ref 65–99)
Potassium: 3.8 mmol/L (ref 3.5–5.1)
Sodium: 117 mmol/L — CL (ref 135–145)

## 2016-08-10 LAB — PHOSPHORUS: Phosphorus: 2.6 mg/dL (ref 2.5–4.6)

## 2016-08-10 LAB — MAGNESIUM: Magnesium: 1.5 mg/dL — ABNORMAL LOW (ref 1.7–2.4)

## 2016-08-10 LAB — TROPONIN I: Troponin I: 0.25 ng/mL

## 2016-08-10 LAB — ABO/RH: ABO/RH(D): A POS

## 2016-08-10 LAB — OSMOLALITY: Osmolality: 257 mosm/kg — ABNORMAL LOW (ref 275–295)

## 2016-08-10 LAB — I-STAT CG4 LACTIC ACID, ED: Lactic Acid, Venous: 9.14 mmol/L (ref 0.5–1.9)

## 2016-08-10 LAB — PROTIME-INR
INR: 1.15
Prothrombin Time: 14.8 seconds (ref 11.4–15.2)

## 2016-08-10 LAB — TRIGLYCERIDES: Triglycerides: 82 mg/dL (ref <150)

## 2016-08-10 LAB — BRAIN NATRIURETIC PEPTIDE: B Natriuretic Peptide: 116.9 pg/mL — ABNORMAL HIGH (ref 0.0–100.0)

## 2016-08-10 LAB — LACTIC ACID, PLASMA: Lactic Acid, Venous: 6 mmol/L (ref 0.5–1.9)

## 2016-08-10 LAB — ETHANOL: ALCOHOL ETHYL (B): 30 mg/dL — AB (ref <5)

## 2016-08-10 LAB — SODIUM, URINE, RANDOM: Sodium, Ur: 101 mmol/L

## 2016-08-10 LAB — LIPASE, BLOOD: Lipase: 27 U/L (ref 11–51)

## 2016-08-10 MED ORDER — FENTANYL CITRATE (PF) 100 MCG/2ML IJ SOLN
50.0000 ug | INTRAMUSCULAR | Status: AC | PRN
  Administered 2016-08-10 (×3): 50 ug via INTRAVENOUS
  Filled 2016-08-10 (×4): qty 2

## 2016-08-10 MED ORDER — PROPOFOL 1000 MG/100ML IV EMUL
0.0000 ug/kg/min | INTRAVENOUS | Status: DC
  Administered 2016-08-10: 50 ug/kg/min via INTRAVENOUS
  Administered 2016-08-11 (×2): 70 ug/kg/min via INTRAVENOUS
  Administered 2016-08-11: 50 ug/kg/min via INTRAVENOUS
  Filled 2016-08-10 (×4): qty 100

## 2016-08-10 MED ORDER — FENTANYL CITRATE (PF) 100 MCG/2ML IJ SOLN: 50.0000 ug | INTRAMUSCULAR | Status: DC | PRN

## 2016-08-10 MED ORDER — FENTANYL CITRATE (PF) 100 MCG/2ML IJ SOLN: 50.0000 ug | Freq: Once | INTRAMUSCULAR | Status: DC

## 2016-08-10 MED ORDER — ROCURONIUM BROMIDE 50 MG/5ML IV SOLN
INTRAVENOUS | Status: AC | PRN
  Administered 2016-08-10: 80 mg via INTRAVENOUS

## 2016-08-10 MED ORDER — PANTOPRAZOLE SODIUM 40 MG IV SOLR
40.0000 mg | Freq: Every day | INTRAVENOUS | Status: DC
  Administered 2016-08-10: 40 mg via INTRAVENOUS
  Filled 2016-08-10: qty 40

## 2016-08-10 MED ORDER — PIPERACILLIN-TAZOBACTAM 3.375 G IVPB 30 MIN
3.3750 g | Freq: Once | INTRAVENOUS | Status: AC
  Administered 2016-08-10: 3.375 g via INTRAVENOUS
  Filled 2016-08-10: qty 50

## 2016-08-10 MED ORDER — SODIUM CHLORIDE 0.9 % IV SOLN
1000.0000 mg | Freq: Once | INTRAVENOUS | Status: AC
  Administered 2016-08-11: 1000 mg via INTRAVENOUS
  Filled 2016-08-10: qty 10

## 2016-08-10 MED ORDER — FENTANYL 2500MCG IN NS 250ML (10MCG/ML) PREMIX INFUSION
25.0000 ug/h | INTRAVENOUS | Status: DC
  Administered 2016-08-10: 100 ug/h via INTRAVENOUS
  Filled 2016-08-10: qty 250

## 2016-08-10 MED ORDER — MAGNESIUM SULFATE 2 GM/50ML IV SOLN
2.0000 g | Freq: Once | INTRAVENOUS | Status: AC
Start: 2016-08-10 — End: 2016-08-10
  Administered 2016-08-10: 2 g via INTRAVENOUS
  Filled 2016-08-10: qty 50

## 2016-08-10 MED ORDER — PIPERACILLIN-TAZOBACTAM 3.375 G IVPB: 3.3750 g | Freq: Three times a day (TID) | INTRAVENOUS | Status: DC

## 2016-08-10 MED ORDER — VANCOMYCIN HCL IN DEXTROSE 1-5 GM/200ML-% IV SOLN
1000.0000 mg | Freq: Three times a day (TID) | INTRAVENOUS | Status: DC
Start: 2016-08-10 — End: 2016-08-10

## 2016-08-10 MED ORDER — SODIUM CHLORIDE 0.9 % IV SOLN: 250.0000 mL | INTRAVENOUS | Status: DC | PRN

## 2016-08-10 MED ORDER — VANCOMYCIN HCL IN DEXTROSE 1-5 GM/200ML-% IV SOLN
1000.0000 mg | Freq: Once | INTRAVENOUS | Status: AC
  Administered 2016-08-10: 1000 mg via INTRAVENOUS
  Filled 2016-08-10: qty 200

## 2016-08-10 MED ORDER — PROPOFOL 1000 MG/100ML IV EMUL
5.0000 ug/kg/min | Freq: Once | INTRAVENOUS | Status: AC
  Administered 2016-08-10: 5 ug/kg/min via INTRAVENOUS

## 2016-08-10 MED ORDER — SODIUM CHLORIDE 0.9 % IV SOLN
INTRAVENOUS | Status: DC
  Administered 2016-08-10 – 2016-08-11 (×2): 125 mL/h via INTRAVENOUS

## 2016-08-10 MED ORDER — ETOMIDATE 2 MG/ML IV SOLN
INTRAVENOUS | Status: AC | PRN
  Administered 2016-08-10: 25 mg via INTRAVENOUS

## 2016-08-10 MED ORDER — FENTANYL BOLUS VIA INFUSION
25.0000 ug | INTRAVENOUS | Status: DC | PRN
  Filled 2016-08-10: qty 25

## 2016-08-10 MED ORDER — SODIUM CHLORIDE 0.9 % IV SOLN
500.0000 mg | Freq: Two times a day (BID) | INTRAVENOUS | Status: DC
  Administered 2016-08-11: 500 mg via INTRAVENOUS
  Filled 2016-08-10: qty 5

## 2016-08-10 MED ORDER — MIDAZOLAM HCL 2 MG/2ML IJ SOLN
1.0000 mg | INTRAMUSCULAR | Status: DC | PRN
  Administered 2016-08-10 – 2016-08-11 (×2): 1 mg via INTRAVENOUS
  Filled 2016-08-10 (×2): qty 2

## 2016-08-10 NOTE — Consult Note (Signed)
Reason for Consult: small SDH, intraparenchymal hemorrhage Referring Physician: [name] is an 66 y.o. male.   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 Patient fell while at home. He was found down by his wife outside. He was down for approximately 15 minutes before CPR was started by his wife. Wife states that he has been acting different for several weeks now. Was brought in by EMS where he received epi and continued CPR and intubated.  Past Medical History:  Diagnosis Date  . Brain metastasis ([place]) dx'd 09/2015   S/P "10 weeks radiation in 2017" (03/27/2016)  . CAP (community acquired pneumonia) 03/27/2016  . GERD (gastroesophageal reflux disease)    occ  . Renal cancer, left ([place]) dx'd 04/2014   renal-left    Past Surgical History:  Procedure Laterality Date  . COLONOSCOPY    . FOOT TENDON SURGERY Right 2010   tendon donor  . INGUINAL HERNIA REPAIR Right 10/15/2014   Procedure: RIGHT INGUINAL HERNIA REPAIR WITH MESH;  Surgeon: Rolm Bookbinder, MD;  Location: [place];  Service: General;  Laterality: Right;  . INGUINAL HERNIA REPAIR Right   . INSERTION OF MESH Right 10/15/2014   Procedure: INSERTION OF MESH;  Surgeon: Rolm Bookbinder, MD;  Location: [place];  Service: General;  Laterality: Right;  . KIDNEY SURGERY Right 3/16   attempted removal of renal mass but decided to dangerous when opened abdomen    No Known Allergies  Social History  Substance Use Topics  . Smoking status: Former Smoker    Packs/day: 1.00    Years: 25.00    Types: Cigarettes    Quit date: 10/10/1999  . Smokeless tobacco: Never Used  . Alcohol use 12.6 oz/week    21 Cans of beer per week     Comment: 03/27/2016 "~ 3  beers per night"    Family History  Problem Relation Age of Onset  . Prostate cancer Brother   . Heart failure Mother   . Diabetes Mother   . Diabetes Father      Review of  Systems  Positive ROS:   All other systems have been reviewed and were otherwise negative with the exception of those mentioned in the HPI and as above.  Objective: Vital signs in last 24 hours: Temp:  [97.8 F (36.6 C)] 97.8 F (36.6 C) (07/09 1350) Pulse Rate:  [72-114] 82 (07/09 1700) Resp:  [13-22] 21 (07/09 1700) BP: (136-183)/(77-111) 143/77 (07/09 1700) SpO2:  [99 %-100 %] 100 % (07/09 1700) FiO2 (%):  [50 %-100 %] 50 % (07/09 1657) Weight:  [81.6 kg (180 lb)] 81.6 kg (180 lb) (07/09 1439)  General Appearance: unresponsive Head: Normocephalic, without obvious abnormality, atraumatic Eyes: Pupils nonreactive bilaterally, conjunctiva/corneas clear, EOM's not intact, no corneal reflexes Ears: not tested Throat: NA Neck: Supple, intubated Back:not tested Lungs:  Respirations labored, intubated Heart: Regular rate and rhythm Abdomen: not tested Extremities: Extremities normal, atraumatic, no cyanosis or edema Pulses: not tested Skin: Skin color, texture normal  NEUROLOGIC:   Mental status: unresponsive Motor Exam - unable to test Sensory Exam - unable to test Reflexes: absent Coordination - unable to test Gait - unable to test Balance -unable to test Cranial Nerves: I: smell Not tested  II: visual acuity  OS: NA OD:NA  II: visual fields   II: pupils Nonreactive  III,VII: ptosis   III,IV,VI: extraocular muscles  V: mastication   V: facial light touch sensation    V,VII: corneal reflex  absent  VII: facial muscle function - upper    VII: facial muscle function - lower   VIII: hearing Not tested  IX: soft palate elevation    IX,X: gag reflex absent  XI: trapezius strength    XI: sternocleidomastoid strength   XI: neck flexion strength    XII: tongue strength      Data Review Lab Results  Component Value Date   WBC 5.6 08/23/2016   HGB 11.0 (L) 08/11/2016   HCT 34.2 (L) 08/21/2016   MCV 91.9 08/29/2016   PLT 313 08/08/2016   Lab Results   Component Value Date   NA 115 (LL) 08/28/2016   K 3.5 08/26/2016   CL 83 (L) 08/04/2016   CO2 15 (L) 08/14/2016   BUN <5 (L) 08/28/2016   CREATININE 0.62 08/15/2016   GLUCOSE 204 (H) 08/16/2016   Lab Results  Component Value Date   INR 1.16 05/04/2014    Radiology: Ct Head Wo Contrast  Result Date: 08/09/2016 CLINICAL DATA:  Ct head/cspine wo, cardiac arrest; unwitnessed fall EXAM: CT HEAD WITHOUT CONTRAST CT CERVICAL SPINE WITHOUT CONTRAST TECHNIQUE: Multidetector CT imaging of the head and cervical spine was performed following the standard protocol without intravenous contrast. Multiplanar CT image reconstructions of the cervical spine were also generated. COMPARISON:  05/16/2016 01/07/2016 FINDINGS: CT HEAD FINDINGS Brain: There is high attenuation along the tentorium bilaterally, left greater than right. High attenuation is also identified along the falx, greater along the posterior aspect. The findings are consistent with small subdural hemorrhage. There is small amount of subarachnoid or parenchymal hemorrhage in the temporal lobes. There are focal calcifications within the right frontal lobe (image 17 in 26 of series 4), consistent with known and treated metastatic disease. These appear stable. Vascular: There is mild atherosclerotic calcification of the internal carotid arteries. Skull: No acute calvarial fracture. There is a lytic lesion involving the medial wall of the left orbit, contiguous with the left frontal sinus which has developed since prior study. Considerations include metastatic disease or aggressive sinus disease. There is associated soft tissue density in this region along the medial aspect of the left orbit. There is an irregular lytic lesion in the right frontal bone, measuring 6 mm on image 77 of series 5. Lesion is larger compared prior study, suspicious for lytic metastatic lesion. Sinuses/Orbits: There is significant sinus opacity involving the ethmoid, maxillary, and  frontal sinuses. There is significant density involving the left frontal sinus and medial wall of the left orbit, associated with osseous destruction, as above. Mastoid air cells are normally aerated. Other: Significant scalp hematoma across the vertex and occipital regions. CT CERVICAL SPINE FINDINGS Alignment: Alignment is normal. Skull base and vertebrae: There is a fracture involving the lateral mass of C6. There is widening of the facet joint at C5-6 on the right as result of the fracture. The right transverse foramen is also disrupted, raising question of integrity of the right vertebral artery. The vertebral body is intact. Soft tissues and spinal canal: Endotracheal tube and nasogastric tube are in place. Disc levels: Degenerative changes are identified throughout the cervical spine. Upper chest: Negative. Other: None IMPRESSION: 1. Small subdural hemorrhage seen as a small amount of blood along the tentorium and falx. 2. Small amount of subarachnoid or intraparenchymal blood involving the temporal lobes bilaterally. 3. Fracture of the right lateral mass of C6 with widening of the facet joint  on the right at C5-6 and involvement of the right lateral foramen. Recommend CTA to evaluate the vertebral artery. 4. Osseous destruction of the left frontal bone and medial wall the left orbit, appearance favoring metastatic disease but possibly representing aggressive sinus disease. This is not an acute fracture. 5. Suspect osseous metastatic lesion in the right frontal bone, larger compared with prior study. 6. Stable appearance of treated metastases in the brain. Critical Value/emergent results were called by telephone at the time of interpretation on 08/26/2016 at 3:25 pm to Dr. Billy Fischer, who verbally acknowledged these results. Electronically Signed   By: Nolon Nations M.D.   On: 08/26/2016 15:40   Ct Cervical Spine Wo Contrast  Result Date: 08/11/2016 CLINICAL DATA:  Ct head/cspine wo, cardiac arrest;  unwitnessed fall EXAM: CT HEAD WITHOUT CONTRAST CT CERVICAL SPINE WITHOUT CONTRAST TECHNIQUE: Multidetector CT imaging of the head and cervical spine was performed following the standard protocol without intravenous contrast. Multiplanar CT image reconstructions of the cervical spine were also generated. COMPARISON:  05/16/2016 01/07/2016 FINDINGS: CT HEAD FINDINGS Brain: There is high attenuation along the tentorium bilaterally, left greater than right. High attenuation is also identified along the falx, greater along the posterior aspect. The findings are consistent with small subdural hemorrhage. There is small amount of subarachnoid or parenchymal hemorrhage in the temporal lobes. There are focal calcifications within the right frontal lobe (image 17 in 26 of series 4), consistent with known and treated metastatic disease. These appear stable. Vascular: There is mild atherosclerotic calcification of the internal carotid arteries. Skull: No acute calvarial fracture. There is a lytic lesion involving the medial wall of the left orbit, contiguous with the left frontal sinus which has developed since prior study. Considerations include metastatic disease or aggressive sinus disease. There is associated soft tissue density in this region along the medial aspect of the left orbit. There is an irregular lytic lesion in the right frontal bone, measuring 6 mm on image 77 of series 5. Lesion is larger compared prior study, suspicious for lytic metastatic lesion. Sinuses/Orbits: There is significant sinus opacity involving the ethmoid, maxillary, and frontal sinuses. There is significant density involving the left frontal sinus and medial wall of the left orbit, associated with osseous destruction, as above. Mastoid air cells are normally aerated. Other: Significant scalp hematoma across the vertex and occipital regions. CT CERVICAL SPINE FINDINGS Alignment: Alignment is normal. Skull base and vertebrae: There is a  fracture involving the lateral mass of C6. There is widening of the facet joint at C5-6 on the right as result of the fracture. The right transverse foramen is also disrupted, raising question of integrity of the right vertebral artery. The vertebral body is intact. Soft tissues and spinal canal: Endotracheal tube and nasogastric tube are in place. Disc levels: Degenerative changes are identified throughout the cervical spine. Upper chest: Negative. Other: None IMPRESSION: 1. Small subdural hemorrhage seen as a small amount of blood along the tentorium and falx. 2. Small amount of subarachnoid or intraparenchymal blood involving the temporal lobes bilaterally. 3. Fracture of the right lateral mass of C6 with widening of the facet joint on the right at C5-6 and involvement of the right lateral foramen. Recommend CTA to evaluate the vertebral artery. 4. Osseous destruction of the left frontal bone and medial wall the left orbit, appearance favoring metastatic disease but possibly representing aggressive sinus disease. This is not an acute fracture. 5. Suspect osseous metastatic lesion in the right frontal bone, larger compared with prior  study. 6. Stable appearance of treated metastases in the brain. Critical Value/emergent results were called by telephone at the time of interpretation on 08/11/2016 at 3:25 pm to Dr. Billy Fischer, who verbally acknowledged these results. Electronically Signed   By: Nolon Nations M.D.   On: 08/28/2016 15:40   Dg Chest Portable 1 View  Result Date: 08/29/2016 CLINICAL DATA:  66 y/o  M; status post intubation. EXAM: PORTABLE CHEST 1 VIEW COMPARISON:  04/24/2016 chest CT.  03/27/2016 chest radiograph. FINDINGS: Mildly enlarged cardiac silhouette. Endotracheal tube 2.8 cm from carina. Enteric tube tip stented gastroesophageal junction, advancement recommended. Transcutaneous pacing pads. Patchy pulmonary opacities with central predominance. No pleural effusion or pneumothorax. No acute  osseous abnormality is evident. IMPRESSION: 1. Moderate pulmonary edema.  Underlying pneumonia not excluded. 2. Endotracheal tube 2.8 cm from carina. 3. Enteric tube tip at the gastroesophageal junction, advancement recommended. These results will be called to the ordering clinician or representative by the Radiologist Assistant, and communication documented in the PACS or zVision Dashboard. Electronically Signed   By: Kristine Garbe M.D.   On: 08/09/2016 14:20   CT scan: small subdural hemorrhage seen along hte tentorium and falx. Small subarachnoid or intraparenchymal blood invloving the temporal lobes. osseous metastativ lesion in the right frontal bone.   Assessment/Plan: Patient is currently having myoclonic jerking post CPR. No corneal reflexes, GCS 3. CCM will admit to the ICU. There is currently nothing to be done for the patient for a neurosurgical stand point. End of life care was discussed with the wife by CCM. Wife states that she would like 24 hours to make a decision but feels that the patient would not want to live like this. I do not expect that the patient with recover from this injury and has suffered an anoxic brain injury, as this is his biggest problem at this time.    Jacob Beck 08/19/2016 5:17 PM

## 2016-08-10 NOTE — Progress Notes (Signed)
eLink Physician-Brief Progress Note Patient Name: Jacob Beck DOB: [birth year] MRN: [id number]   Date of Service  08/17/2016  HPI/Events of Note  Myoclonus, EEG pending  eICU Interventions  Load & dose keppra Ct propofol gtt Versed prn     Intervention Category Major Interventions: Seizures - evaluation and management  Jacob Beck,Jacob V. 08/26/2016, 11:48 PM

## 2016-08-10 NOTE — ED Notes (Signed)
Family at bedside. 

## 2016-08-10 NOTE — ED Notes (Signed)
EPI drip stopped per Dr. Billy Fischer, will take patient to CT 2.

## 2016-08-10 NOTE — ED Notes (Addendum)
Per EMS- Pt comes from home, apparently he fell into coffee table onto a potted plant when he collapsed. His wife went outside and 15 mins later came back in to find him unresponsive on floor. She started CPR, fire dept arrived, AED applied, advised no shock. EMS arrived PEA rate 40, gave 2 epi's, total 9 mins of CPR. ROSC return to AFIB 180-200. Started on epi drip 2-6 mcg. King airway in place. Left EJ.

## 2016-08-10 NOTE — Progress Notes (Signed)
eLink Physician-Brief Progress Note Patient Name: EMAAD NANNA DOB: Mar 22, 1950 MRN: [id number]   Date of Service  08/03/2016  HPI/Events of Note  Multiple issues: 1. Na+ = 115 --> 117 (2 PM --> 8 PM) , 2. Seizures vs Myoclonus, 3. Mg++ = 1.5 and Creatinine = 0.84 and 4. Lactic Acid = 9.14 --> 6.0.  eICU Interventions  Will order: 1. Change BMP to Q 4 hours. 2. Versed 1 mg IV Q 1 hour PRN seizures or myoclonus.  3. Replace Mg++.      Intervention Category Major Interventions: Electrolyte abnormality - evaluation and management;Seizures - evaluation and management  Lysle Dingwall 08/29/2016, 9:36 PM

## 2016-08-10 NOTE — Progress Notes (Signed)
Pharmacy Antibiotic Note  Jacob Beck is a 66 y.o. male admitted on 09/01/2016 with sepsis.  Pharmacy has been consulted for vancomycin and Zosyn dosing.  LA elevated at 9.1, SCr ok at 0.62- CrCL ~85-82mL/min. WBC normal, no temp recorded yet.  Has already received vancomycin 1g IV x1 and Zosyn 3.375g IV x1  Plan: Vancomycin 1g IV q8h. Goal trough 15-69mcg/mL Zosyn 3.375g IV q8h Follow renal function, c/s, clinical progression   Height: 5\' 8"  (172.7 cm) Weight: 180 lb (81.6 kg) IBW/kg (Calculated) : 68.4  No data recorded.   Recent Labs Lab 08/28/2016 1403 08/02/2016 1417  WBC 5.6  --   CREATININE 0.62  --   LATICACIDVEN  --  9.14*    Estimated Creatinine Clearance: 89.1 mL/min (by C-G formula based on SCr of 0.62 mg/dL).    No Known Allergies  Antimicrobials this admission: Vanc 7/9>> Zosyn 7/9>>  Dose adjustments this admission: n/a  Microbiology results: 7/9 BCx: 7/9 urine:  Thank you for allowing pharmacy to be a part of this patient's care.  Demeisha Geraghty D. Jakelyn Squyres, PharmD, BCPS Clinical Pharmacist Pager: [phone] Clinical Phone for 08/06/2016 until 3:30pm: [phone] If after 3:30pm, please call main pharmacy at [phone] 08/26/2016 3:32 PM

## 2016-08-10 NOTE — ED Provider Notes (Addendum)
[facility] DEPT Provider Note   CSN: [id number] Arrival date & time: 08/24/2016  1344     History   Chief Complaint Chief Complaint  Patient presents with  . Cardiac Arrest    HPI Jacob Beck is a 66 y.o. male.  HPI   66yo male presents post cardiac arrest.  Per wife, patient woke up last night, could not sleep, was belching repeatedly, woke up at 4AM notbeing able to sleep and drank a beer, and then she saw him at 630AM and he was having another. He toldher he was trying to drink because he felt like he could not sleep. He did not tell her if he was having dyspnea, chest pain, and otherwise had not reported vomiting, diarrhea, fever, cough, urinary symptoms. He was having some abdominal discomfort with belching.  She reports she went to physical therapy and found him laying off the edge of the bed, with head on the ground and legs on the bed. He woke up to her and was speaking and she helped lay him back in bed. She then went to mow the lawn and returned 50min later and found he fell in the living room, face down into a plant that was on end table. Reports she called 911 and a neighbor came over and they turned him over and started compressions.  Maximum time down was 74min at this time per wife. They started bystander CPR. On fire and EMS arrival, patient was found to be in PEA. They continued CPR and ACLS with epinephrine for 9 minutes with ROSC. Also attempted intubation but did not get view then placed LMA. Pressure dropped and he required epinephrine for pressures. Maintained pulses until ED. GCS 3 but maintaining ETCO2   Past Medical History:  Diagnosis Date  . Brain metastasis ([place]) dx'd 09/2015   S/P "10 weeks radiation in 2017" (03/27/2016)  . CAP (community acquired pneumonia) 03/27/2016  . GERD (gastroesophageal reflux disease)    occ  . Renal cancer, left ([place]) dx'd 04/2014   renal-left    Patient Active Problem List   Diagnosis Date Noted  . Cardiac arrest ([place])  08/13/2016  . Fever 03/27/2016  . GERD (gastroesophageal reflux disease) 03/27/2016  . Hypercalcemia 03/27/2016  . CAP (community acquired pneumonia) 03/27/2016  . Brain metastasis ([place]) 10/04/2015  . Renal cell carcinoma ([place]) 09/18/2015  . Renal mass   . Left renal mass     Past Surgical History:  Procedure Laterality Date  . COLONOSCOPY    . FOOT TENDON SURGERY Right 2010   tendon donor  . INGUINAL HERNIA REPAIR Right 10/15/2014   Procedure: RIGHT INGUINAL HERNIA REPAIR WITH MESH;  Surgeon: Rolm Bookbinder, MD;  Location: [facility];  Service: General;  Laterality: Right;  . INGUINAL HERNIA REPAIR Right   . INSERTION OF MESH Right 10/15/2014   Procedure: INSERTION OF MESH;  Surgeon: Rolm Bookbinder, MD;  Location: [facility];  Service: General;  Laterality: Right;  . KIDNEY SURGERY Right 3/16   attempted removal of renal mass but decided to dangerous when opened abdomen       Home Medications    Prior to Admission medications   Medication Sig Start Date End Date Taking? Authorizing Provider  cabozantinib S-Malate 40 MG TABS Take 1 tablet by mouth daily. 08/07/16  Yes Wyatt Portela, MD  dexamethasone (DECADRON) 4 MG tablet Take 1 tablet (4 mg total) by mouth 3 (three) times daily. 05/12/16  Yes Hayden Pedro, PA-C  oxyCODONE (OXY IR/ROXICODONE)  5 MG immediate release tablet Take 2 tablets (10 mg total) by mouth every 6 (six) hours as needed for severe pain. 07/14/16  Yes Wyatt Portela, MD  pantoprazole (PROTONIX) 40 MG tablet Take 1 tablet (40 mg total) by mouth 2 (two) times daily. 06/05/16  Yes Wyatt Portela, MD  temazepam (RESTORIL) 30 MG capsule TAKE 1 CAPSULE BY MOUTH DAILY AT BEDTIME AS NEEDED FOR SLEEP 07/14/16  Yes Wyatt Portela, MD  loperamide (IMODIUM) 2 MG capsule Take 2 mg by mouth as needed for diarrhea or loose stools.    [provider]  Multiple Vitamins-Minerals (MULTIVITAMIN WITH MINERALS) tablet Take 1 tablet by mouth daily.    [provider]  ondansetron (ZOFRAN) 4 MG tablet Take 1 tablet (4 mg total) by mouth every 6 (six) hours as needed for nausea. Patient not taking: Reported on 05/12/2016 03/29/16   Benito Mccreedy, MD  polyethylene glycol (MIRALAX / GLYCOLAX) packet Take 17 g by mouth daily as needed for mild constipation. 03/29/16   Benito Mccreedy, MD  Probiotic Product (PROBIOTIC DAILY PO) Take 1 tablet by mouth daily. 03/26/16   [provider]    Family History Family History  Problem Relation Age of Onset  . Prostate cancer Brother   . Heart failure Mother   . Diabetes Mother   . Diabetes Father     Social History Social History  Substance Use Topics  . Smoking status: Former Smoker    Packs/day: 1.00    Years: 25.00    Types: Cigarettes    Quit date: 10/10/1999  . Smokeless tobacco: Never Used  . Alcohol use 12.6 oz/week    21 Cans of beer per week     Comment: 03/27/2016 "~ 3  beers per night"     Allergies   Patient has no known allergies.   Review of Systems Review of Systems  Unable to perform ROS: Patient unresponsive     Physical Exam Updated Vital Signs BP (!) 150/82   Pulse (!) 114   Temp 97.8 F (36.6 C) (Oral)   Resp 19   Ht 5\' 8"  (1.727 m)   Wt 83.4 kg (183 lb 13.8 oz)   SpO2 96%   BMI 27.96 kg/m   Physical Exam  Constitutional: He appears well-developed and well-nourished. He has a sickly appearance. He appears ill.  HENT:  Head: Normocephalic.  Left orbital contusion  Eyes: Conjunctivae are normal. Pupils are equal, round, and reactive to light.  Neck: Normal range of motion.  Cardiovascular: Normal rate, regular rhythm, normal heart sounds and intact distal pulses.  Exam reveals no gallop and no friction rub.   No murmur heard. Pulmonary/Chest: Breath sounds normal.  LMA in place, BVM  Abdominal: Soft. He exhibits no distension. There is no guarding.  Priori surgical scar Appearance of healing abrasion x2 left abdomen  Musculoskeletal:  He exhibits no edema.  Neurological: He is unresponsive. GCS eye subscore is 1. GCS verbal subscore is 1. GCS motor subscore is 1.  Skin: Skin is warm and dry. He is not diaphoretic.  Nursing note and vitals reviewed.    ED Treatments / Results  Labs (all labs ordered are listed, but only abnormal results are displayed) Labs Reviewed  COMPREHENSIVE METABOLIC PANEL - Abnormal; Notable for the following:       Result Value   Sodium 115 (*)    Chloride 83 (*)    CO2 15 (*)    Glucose, Bld 204 (*)  BUN <5 (*)    Calcium 7.4 (*)    Total Protein 5.5 (*)    Albumin 2.2 (*)    AST 104 (*)    ALT 64 (*)    Anion gap 17 (*)    All other components within normal limits  CBC WITH DIFFERENTIAL/PLATELET - Abnormal; Notable for the following:    RBC 3.72 (*)    Hemoglobin 11.0 (*)    HCT 34.2 (*)    RDW 24.8 (*)    All other components within normal limits  URINALYSIS, ROUTINE W REFLEX MICROSCOPIC - Abnormal; Notable for the following:    Color, Urine STRAW (*)    Glucose, UA 150 (*)    Hgb urine dipstick MODERATE (*)    Protein, ur 30 (*)    All other components within normal limits  BRAIN NATRIURETIC PEPTIDE - Abnormal; Notable for the following:    B Natriuretic Peptide 116.9 (*)    All other components within normal limits  ETHANOL - Abnormal; Notable for the following:    Alcohol, Ethyl (B) 30 (*)    All other components within normal limits  ACETAMINOPHEN LEVEL - Abnormal; Notable for the following:    Acetaminophen (Tylenol), Serum <10 (*)    All other components within normal limits  MAGNESIUM - Abnormal; Notable for the following:    Magnesium 1.5 (*)    All other components within normal limits  TROPONIN I - Abnormal; Notable for the following:    Troponin I 0.25 (*)    All other components within normal limits  OSMOLALITY - Abnormal; Notable for the following:    Osmolality 257 (*)    All other components within normal limits  BASIC METABOLIC PANEL - Abnormal;  Notable for the following:    Sodium 117 (*)    Chloride 81 (*)    CO2 20 (*)    Glucose, Bld 156 (*)    Calcium 7.7 (*)    Anion gap 16 (*)    All other components within normal limits  GLUCOSE, CAPILLARY - Abnormal; Notable for the following:    Glucose-Capillary 169 (*)    All other components within normal limits  I-STAT CG4 LACTIC ACID, ED - Abnormal; Notable for the following:    Lactic Acid, Venous 9.14 (*)    All other components within normal limits  I-STAT ARTERIAL BLOOD GAS, ED - Abnormal; Notable for the following:    pO2, Arterial 386.0 (*)    Bicarbonate 19.0 (*)    Acid-base deficit 5.0 (*)    All other components within normal limits  CULTURE, BLOOD (ROUTINE X 2)  CULTURE, BLOOD (ROUTINE X 2)  URINE CULTURE  MRSA PCR SCREENING  LIPASE, BLOOD  SALICYLATE LEVEL  PHOSPHORUS  CORTISOL  PROTIME-INR  TRIGLYCERIDES  BLOOD GAS, VENOUS  RAPID URINE DRUG SCREEN, HOSP PERFORMED  HIV ANTIBODY (ROUTINE TESTING)  TROPONIN I  TROPONIN I  LACTIC ACID, PLASMA  LACTIC ACID, PLASMA  BLOOD GAS, ARTERIAL  OSMOLALITY, URINE  SODIUM, URINE, RANDOM  BASIC METABOLIC PANEL  BASIC METABOLIC PANEL  BASIC METABOLIC PANEL  BASIC METABOLIC PANEL  CBC  MAGNESIUM  PHOSPHORUS  BLOOD GAS, ARTERIAL  BASIC METABOLIC PANEL  BASIC METABOLIC PANEL  BASIC METABOLIC PANEL  I-STAT TROPOININ, ED  I-STAT CG4 LACTIC ACID, ED  TYPE AND SCREEN  ABO/RH    EKG  EKG Interpretation  Date/Time:  Monday August 10 2016 13:48:46 EDT Ventricular Rate:  81 PR Interval:    QRS Duration: 101  QT Interval:  362 QTC Calculation: 421 R Axis:   53 Text Interpretation:  Sinus rhythm Minimal ST depression, anterolateral leads No significant change since last tracing Confirmed by Gareth Morgan [phone]) on 08/13/2016 3:10:39 PM       Radiology Ct Head Wo Contrast  Result Date: 08/16/2016 CLINICAL DATA:  Ct head/cspine wo, cardiac arrest; unwitnessed fall EXAM: CT HEAD WITHOUT CONTRAST CT CERVICAL  SPINE WITHOUT CONTRAST TECHNIQUE: Multidetector CT imaging of the head and cervical spine was performed following the standard protocol without intravenous contrast. Multiplanar CT image reconstructions of the cervical spine were also generated. COMPARISON:  05/16/2016 01/07/2016 FINDINGS: CT HEAD FINDINGS Brain: There is high attenuation along the tentorium bilaterally, left greater than right. High attenuation is also identified along the falx, greater along the posterior aspect. The findings are consistent with small subdural hemorrhage. There is small amount of subarachnoid or parenchymal hemorrhage in the temporal lobes. There are focal calcifications within the right frontal lobe (image 17 in 26 of series 4), consistent with known and treated metastatic disease. These appear stable. Vascular: There is mild atherosclerotic calcification of the internal carotid arteries. Skull: No acute calvarial fracture. There is a lytic lesion involving the medial wall of the left orbit, contiguous with the left frontal sinus which has developed since prior study. Considerations include metastatic disease or aggressive sinus disease. There is associated soft tissue density in this region along the medial aspect of the left orbit. There is an irregular lytic lesion in the right frontal bone, measuring 6 mm on image 77 of series 5. Lesion is larger compared prior study, suspicious for lytic metastatic lesion. Sinuses/Orbits: There is significant sinus opacity involving the ethmoid, maxillary, and frontal sinuses. There is significant density involving the left frontal sinus and medial wall of the left orbit, associated with osseous destruction, as above. Mastoid air cells are normally aerated. Other: Significant scalp hematoma across the vertex and occipital regions. CT CERVICAL SPINE FINDINGS Alignment: Alignment is normal. Skull base and vertebrae: There is a fracture involving the lateral mass of C6. There is widening of the  facet joint at C5-6 on the right as result of the fracture. The right transverse foramen is also disrupted, raising question of integrity of the right vertebral artery. The vertebral body is intact. Soft tissues and spinal canal: Endotracheal tube and nasogastric tube are in place. Disc levels: Degenerative changes are identified throughout the cervical spine. Upper chest: Negative. Other: None IMPRESSION: 1. Small subdural hemorrhage seen as a small amount of blood along the tentorium and falx. 2. Small amount of subarachnoid or intraparenchymal blood involving the temporal lobes bilaterally. 3. Fracture of the right lateral mass of C6 with widening of the facet joint on the right at C5-6 and involvement of the right lateral foramen. Recommend CTA to evaluate the vertebral artery. 4. Osseous destruction of the left frontal bone and medial wall the left orbit, appearance favoring metastatic disease but possibly representing aggressive sinus disease. This is not an acute fracture. 5. Suspect osseous metastatic lesion in the right frontal bone, larger compared with prior study. 6. Stable appearance of treated metastases in the brain. Critical Value/emergent results were called by telephone at the time of interpretation on 08/19/2016 at 3:25 pm to Dr. Billy Fischer, who verbally acknowledged these results. Electronically Signed   By: Nolon Nations M.D.   On: 08/11/2016 15:40   Ct Cervical Spine Wo Contrast  Result Date: 08/29/2016 CLINICAL DATA:  Ct head/cspine wo, cardiac arrest; unwitnessed fall EXAM:  CT HEAD WITHOUT CONTRAST CT CERVICAL SPINE WITHOUT CONTRAST TECHNIQUE: Multidetector CT imaging of the head and cervical spine was performed following the standard protocol without intravenous contrast. Multiplanar CT image reconstructions of the cervical spine were also generated. COMPARISON:  05/16/2016 01/07/2016 FINDINGS: CT HEAD FINDINGS Brain: There is high attenuation along the tentorium bilaterally, left  greater than right. High attenuation is also identified along the falx, greater along the posterior aspect. The findings are consistent with small subdural hemorrhage. There is small amount of subarachnoid or parenchymal hemorrhage in the temporal lobes. There are focal calcifications within the right frontal lobe (image 17 in 26 of series 4), consistent with known and treated metastatic disease. These appear stable. Vascular: There is mild atherosclerotic calcification of the internal carotid arteries. Skull: No acute calvarial fracture. There is a lytic lesion involving the medial wall of the left orbit, contiguous with the left frontal sinus which has developed since prior study. Considerations include metastatic disease or aggressive sinus disease. There is associated soft tissue density in this region along the medial aspect of the left orbit. There is an irregular lytic lesion in the right frontal bone, measuring 6 mm on image 77 of series 5. Lesion is larger compared prior study, suspicious for lytic metastatic lesion. Sinuses/Orbits: There is significant sinus opacity involving the ethmoid, maxillary, and frontal sinuses. There is significant density involving the left frontal sinus and medial wall of the left orbit, associated with osseous destruction, as above. Mastoid air cells are normally aerated. Other: Significant scalp hematoma across the vertex and occipital regions. CT CERVICAL SPINE FINDINGS Alignment: Alignment is normal. Skull base and vertebrae: There is a fracture involving the lateral mass of C6. There is widening of the facet joint at C5-6 on the right as result of the fracture. The right transverse foramen is also disrupted, raising question of integrity of the right vertebral artery. The vertebral body is intact. Soft tissues and spinal canal: Endotracheal tube and nasogastric tube are in place. Disc levels: Degenerative changes are identified throughout the cervical spine. Upper chest:  Negative. Other: None IMPRESSION: 1. Small subdural hemorrhage seen as a small amount of blood along the tentorium and falx. 2. Small amount of subarachnoid or intraparenchymal blood involving the temporal lobes bilaterally. 3. Fracture of the right lateral mass of C6 with widening of the facet joint on the right at C5-6 and involvement of the right lateral foramen. Recommend CTA to evaluate the vertebral artery. 4. Osseous destruction of the left frontal bone and medial wall the left orbit, appearance favoring metastatic disease but possibly representing aggressive sinus disease. This is not an acute fracture. 5. Suspect osseous metastatic lesion in the right frontal bone, larger compared with prior study. 6. Stable appearance of treated metastases in the brain. Critical Value/emergent results were called by telephone at the time of interpretation on 08/06/2016 at 3:25 pm to Dr. Billy Fischer, who verbally acknowledged these results. Electronically Signed   By: Nolon Nations M.D.   On: 08/18/2016 15:40   Dg Chest Portable 1 View  Result Date: 08/24/2016 CLINICAL DATA:  66 y/o  M; status post intubation. EXAM: PORTABLE CHEST 1 VIEW COMPARISON:  04/24/2016 chest CT.  03/27/2016 chest radiograph. FINDINGS: Mildly enlarged cardiac silhouette. Endotracheal tube 2.8 cm from carina. Enteric tube tip stented gastroesophageal junction, advancement recommended. Transcutaneous pacing pads. Patchy pulmonary opacities with central predominance. No pleural effusion or pneumothorax. No acute osseous abnormality is evident. IMPRESSION: 1. Moderate pulmonary edema.  Underlying pneumonia not excluded. 2.  Endotracheal tube 2.8 cm from carina. 3. Enteric tube tip at the gastroesophageal junction, advancement recommended. These results will be called to the ordering clinician or representative by the Radiologist Assistant, and communication documented in the PACS or zVision Dashboard. Electronically Signed   By: Kristine Garbe M.D.   On: 08/18/2016 14:20    Procedures Procedure Name: Intubation Date/Time: 08/22/2016 9:01 PM Performed by: Gareth Morgan Pre-anesthesia Checklist: Patient identified, Emergency Drugs available, Patient being monitored and Timeout performed Oxygen Delivery Method: Ambu bag Preoxygenation: Pre-oxygenation with 100% oxygen Intubation Type: Rapid sequence Laryngoscope Size: Glidescope Grade View: Grade I Tube size: 7.0 mm Secured at: 25 cm Tube secured with: ETT holder      (including critical care time)   CRITICAL CARE: post arrest Performed by: Alvino Chapel   Total critical care time: 60 minutes  Critical care time was exclusive of separately billable procedures and treating other patients.  Critical care was necessary to treat or prevent imminent or life-threatening deterioration.  Critical care was time spent personally by me on the following activities: development of treatment plan with patient and/or surrogate as well as nursing, discussions with consultants, evaluation of patient's response to treatment, examination of patient, obtaining history from patient or surrogate, ordering and performing treatments and interventions, ordering and review of laboratory studies, ordering and review of radiographic studies, pulse oximetry and re-evaluation of patient's condition.  Medications Ordered in ED Medications  pantoprazole (PROTONIX) injection 40 mg (40 mg Intravenous Given 08/21/2016 2117)  0.9 %  sodium chloride infusion (125 mL/hr Intravenous New Bag/Given 08/11/2016 2002)  propofol (DIPRIVAN) 1000 MG/100ML infusion (50 mcg/kg/min  81.6 kg Intravenous New Bag/Given 08/28/2016 2002)  fentaNYL (SUBLIMAZE) injection 50 mcg (not administered)  etomidate (AMIDATE) injection (25 mg Intravenous Given 08/25/2016 1358)  rocuronium (ZEMURON) injection (80 mg Intravenous Given 08/21/2016 1358)  piperacillin-tazobactam (ZOSYN) IVPB 3.375 g (0 g Intravenous Stopped  08/20/2016 1528)  vancomycin (VANCOCIN) IVPB 1000 mg/200 mL premix (0 mg Intravenous Stopped 08/24/2016 1558)  propofol (DIPRIVAN) 1000 MG/100ML infusion (40 mcg/kg/min  81.6 kg Intravenous Transfusing/Transfer 08/14/2016 1909)  fentaNYL (SUBLIMAZE) injection 50 mcg (50 mcg Intravenous Given 08/26/2016 2114)     Initial Impression / Assessment and Plan / ED Course  I have reviewed the triage vital signs and the nursing notes.  Pertinent labs & imaging results that were available during my care of the patient were reviewed by me and considered in my medical decision making (see chart for details).     66yo male with history of renal cell carcinoma presents with concern for cardiac arrest. Patient with feeling ill, belching, not able to sleep prior to event per wife, was drinking beer, and next was noted to be down with face hitting plant.  Estimated total time without pulse 97min, some bystander CPR, 9 minutes of ACLS for PEA with EMS.   On arrival, patient on epi gtt with LMA.  Intubated and epi gtt discontinued with patient maintaining blood pressures.  Labs show hyponatremia, lactic acid. Ordered abx/blood cx given possible infection as etiology of PEA. Wife does not describe dyspnea, no continuing hypotension, doubt PE/AAA rupture at this time.  Pt intubated for airway protection by Carmon Sails PA-C with my supervisoin. XR reviewed by myself and tube withdrawn 2cm, then instructed to withdraw additional 1.5cm.  Pt given propofol.  Pt began to show myoclonic jerking concerning for anoxic brain injury.  CT head/CSpine shows traumatic SAH/SDH/intraprenchymal hemorrhage, C6 fx.  Consulted NSU.  Pt admitted to CCU.  Updated wife and brother.     Final Clinical Impressions(s) / ED Diagnoses   Final diagnoses:  Cardiac arrest ([place])  PEA (Pulseless electrical activity) ([place])  Subarachnoid hemorrhage ([place])  Subdural hemorrhage ([place])  Other closed nondisplaced fracture of sixth cervical vertebra, initial  encounter ([place])  Intraparenchymal hematoma of brain due to trauma with loss of consciousness, unspecified laterality, initial encounter ([place])  Hyponatremia  Lactic acidosis    New Prescriptions Current Discharge Medication List       Gareth Morgan, MD 08/19/2016 2130    Gareth Morgan, MD 08/30/2016 2132

## 2016-08-10 NOTE — ED Notes (Signed)
Aspen collar applied per Dr. Billy Fischer

## 2016-08-10 NOTE — Progress Notes (Signed)
   08/18/2016 1355  Clinical Encounter Type  Visited With Patient and family together  Visit Type ED  Spiritual Encounters  Spiritual Needs Prayer;Emotional  Stress Factors  Patient Stress Factors Health changes  Family Stress Factors Exhausted  Introduction to family as Pt unresponsive. Prayed with wife and brother. Escorted them to consult B. MD explained situation. Family escorted to Trauma A.

## 2016-08-10 NOTE — H&P (Signed)
PULMONARY / CRITICAL CARE MEDICINE   Name: Jacob Beck MRN: [id number] DOB: [birth year]    ADMISSION DATE:  08/08/2016 CONSULTATION DATE:  08/26/2016  REFERRING MD:  Billy Fischer  CHIEF COMPLAINT:  Cardiac Arrest  HISTORY OF PRESENT ILLNESS:  Pt is encephelopathic; therefore, this HPI is obtained from chart review. Jacob Beck is a 66 y.o. male with PMH as outlined below including left renal cell carcinoma April 2016 with brain mets August 2017 s/p chemo, attempted surgical resection for debulking 06/01/14 (unsuccessful due to size of tumor), whole brain radiation completed 10/04/15, and currently on Cabometyx daily.  He was brought to [facility] ED 7/9 after out of hospital cardiac arrest.  He has been complaining of abdominal pain for about a week prior to the event and constant belching for 2 days prior. Wife reports he has also been weak and has been falling due to his legs giving out on him. He had woken up on the morning of 7/9 and had 3 beers first this in the morning, which is out of character for him. He was resting in bed and his wife was checking on him every 10 minutes. On one of her 10 minute checks he had gotten out of bed and was found unresponsive slumped over the coffee table. Bystander CPR started and  EMS was dispatched. Upon their arrival, he was found to be in PEA.  He received epi x 2 along with roughly 9 minutes of ACLS prior to ROSC.  In ED, he was intubated for airway protection.  Lab work revealed multiple metabolic derangements including Na 115, Glucose 204, Anion Gap 17, Lactic acid 9.14.  CT head pending.  Of note, MRI brain in April 2018 did not show any progression of disease.  Oncology notes do mention that his disease is incurable and he is receiving treatments for palliative purposes only.  PAST MEDICAL HISTORY :  He  has a past medical history of Brain metastasis ([place]) (dx'd 09/2015); CAP (community acquired pneumonia) (03/27/2016); GERD (gastroesophageal reflux disease);  and Renal cancer, left ([place]) (dx'd 04/2014).  PAST SURGICAL HISTORY: He  has a past surgical history that includes Kidney surgery (Right, 3/16); Inguinal hernia repair (Right, 10/15/2014); Insertion of mesh (Right, 10/15/2014); Inguinal hernia repair (Right); Colonoscopy; and Foot Tendon Surgery (Right, 2010).  No Known Allergies  No current facility-administered medications on file prior to encounter.    Current Outpatient Prescriptions on File Prior to Encounter  Medication Sig  . cabozantinib S-Malate 40 MG TABS Take 1 tablet by mouth daily.  Marland Kitchen dexamethasone (DECADRON) 4 MG tablet Take 1 tablet (4 mg total) by mouth 3 (three) times daily.  Marland Kitchen oxyCODONE (OXY IR/ROXICODONE) 5 MG immediate release tablet Take 2 tablets (10 mg total) by mouth every 6 (six) hours as needed for severe pain.  . pantoprazole (PROTONIX) 40 MG tablet Take 1 tablet (40 mg total) by mouth 2 (two) times daily.  . temazepam (RESTORIL) 30 MG capsule TAKE 1 CAPSULE BY MOUTH DAILY AT BEDTIME AS NEEDED FOR SLEEP  . loperamide (IMODIUM) 2 MG capsule Take 2 mg by mouth as needed for diarrhea or loose stools.  . Multiple Vitamins-Minerals (MULTIVITAMIN WITH MINERALS) tablet Take 1 tablet by mouth daily.  . ondansetron (ZOFRAN) 4 MG tablet Take 1 tablet (4 mg total) by mouth every 6 (six) hours as needed for nausea. (Patient not taking: Reported on 05/12/2016)  . polyethylene glycol (MIRALAX / GLYCOLAX) packet Take 17 g by mouth daily as needed for mild constipation.  Marland Kitchen  Probiotic Product (PROBIOTIC DAILY PO) Take 1 tablet by mouth daily.    FAMILY HISTORY:  His indicated that his mother is deceased. He indicated that his father is alive. He indicated that his brother is alive.    SOCIAL HISTORY: He  reports that he quit smoking about 16 years ago. His smoking use included Cigarettes. He has a 25.00 pack-year smoking history. He has never used smokeless tobacco. He reports that he drinks about 12.6 oz of alcohol per week . He  reports that he does not use drugs.  REVIEW OF SYSTEMS:   Unable to obtain as pt is encephalopathic.  SUBJECTIVE:   VITAL SIGNS: BP (!) 183/93   Pulse 87   Resp 16   Ht 5\' 8"  (1.727 m)   Wt 81.6 kg (180 lb)   SpO2 100%   BMI 27.37 kg/m   HEMODYNAMICS:    VENTILATOR SETTINGS: Vent Mode: PRVC FiO2 (%):  [100 %] 100 % Set Rate:  [16 bmp] 16 bmp Vt Set:  [620 mL] 620 mL PEEP:  [5 cmH20] 5 cmH20 Plateau Pressure:  [16 cmH20] 16 cmH20  INTAKE / OUTPUT: No intake/output data recorded.   PHYSICAL EXAMINATION: General: Elderly male in NAD on vent Neuro: GCS 3 T. No sedation HEENT: Hematoma L periorbit. Some bleeding in mouth and on lips. Pupils unreactive Cardiovascular: RRR, no MRG Lungs: Clear bilateral breath sounds Abdomen: Soft, non-tender, non-distended Musculoskeletal: No acute deformity Skin: Grossly intact   LABS:  BMET  Recent Labs Lab 08/25/2016 1403  NA 115*  K 3.5  CL 83*  CO2 15*  BUN <5*  CREATININE 0.62  GLUCOSE 204*    Electrolytes  Recent Labs Lab 08/13/2016 1403  CALCIUM 7.4*    CBC  Recent Labs Lab 08/17/2016 1403  WBC 5.6  HGB 11.0*  HCT 34.2*  PLT 313    Coag's No results for input(s): APTT, INR in the last 168 hours.  Sepsis Markers  Recent Labs Lab 08/27/2016 1417  LATICACIDVEN 9.14*    ABG No results for input(s): PHART, PCO2ART, PO2ART in the last 168 hours.  Liver Enzymes  Recent Labs Lab 08/24/2016 1403  AST 104*  ALT 64*  ALKPHOS 107  BILITOT 0.5  ALBUMIN 2.2*    Cardiac Enzymes No results for input(s): TROPONINI, PROBNP in the last 168 hours.  Glucose No results for input(s): GLUCAP in the last 168 hours.  Imaging Dg Chest Portable 1 View  Result Date: 08/02/2016 CLINICAL DATA:  66 y/o  M; status post intubation. EXAM: PORTABLE CHEST 1 VIEW COMPARISON:  04/24/2016 chest CT.  03/27/2016 chest radiograph. FINDINGS: Mildly enlarged cardiac silhouette. Endotracheal tube 2.8 cm from carina. Enteric  tube tip stented gastroesophageal junction, advancement recommended. Transcutaneous pacing pads. Patchy pulmonary opacities with central predominance. No pleural effusion or pneumothorax. No acute osseous abnormality is evident. IMPRESSION: 1. Moderate pulmonary edema.  Underlying pneumonia not excluded. 2. Endotracheal tube 2.8 cm from carina. 3. Enteric tube tip at the gastroesophageal junction, advancement recommended. These results will be called to the ordering clinician or representative by the Radiologist Assistant, and communication documented in the PACS or zVision Dashboard. Electronically Signed   By: Kristine Garbe M.D.   On: 08/02/2016 14:20     STUDIES:  CT head 7/9 >  CXR 7/9 > pulmonary edema.  CULTURES: Blood 7/9 >  Sputum 7/9 >   ANTIBIOTICS: None.  SIGNIFICANT EVENTS: 7/9 > admit.  LINES/TUBES: ETT 7/9 >   DISCUSSION: 66 y.o. male with hx  of [place] (2016) s/p chemo and attempted surgical resection (failed due to size of tumor) and brain mets (2017) s/p whole brain radiation, admitted 7/9 after PEA cardiac arrest with roughly 9 minutes of ACLS (unkonwn downtime prior to start of ACLS but max 15 min). Of note, oncology notes mention that Mr. Milne disease is incurable.  ASSESSMENT / PLAN:  PULMONARY A: Respiratory insufficiency - due to inability to protect the airway in the setting of cardiac arrest.  P:   Full vent support. Wean as able. VAP prevention measures. SBT once mental status improved. CXR in AM.  CARDIOVASCULAR A:  PEA arrest - unknown downtime (max 25 min) with 15 minutes ACLS before ROSC. Hypertensive urgency  P:  Defer TTM given intracranial hemorrhage Trend troponin, lactate. Assess echo. Trend troponin, lactic Keep SBP < 163mmHg PRN hydralazine  RENAL A:   Hyponatermia - unclear etiology at this point. AGMA - lactate. P:   NS @ 125 Assess serum osmoles, urine sodium. BMP q2hrs.  GASTROINTESTINAL A:    GERD. Nutrition. Abdominal pain P:   SUP: Pantoprazole. NPO. CT abdomen  HEMATOLOGIC A:   Renal cell carcinoma with mets to brain. NO progression on recent MRI Mild anemia - chronic. VTE Prophylaxis. P:  Transfuse for Hgb < 7. SCD's CBC in AM.  INFECTIOUS A:   No indication of infection. P:   Defer abx for now.  Follow cultures as above.  ENDOCRINE A:   Hyperglycemia - no hx DM.   P:   SSI.  NEUROLOGIC A:   SDH, small. Located tentorium and falx. SAH vs IPH bilateral temporal lobes. (Small) Acute encephalopathy - with concern for anoxic brain injury. P:   Sedation:  Propofol infusion. PRN versed RASS goal: 0 to -1. Daily WUA. Assess EEG, UDS. CT angio neck to assess vertebral artery Neurosurgery to see in ED   Family updated: Wife updated  Interdisciplinary Family Meeting v Palliative Care Meeting:  Due by: 08/16/16.  CC time: 50 min   Georgann Housekeeper, AGACNP-BC [facility] Pulmonology/Critical Care Pager [phone] or [phone]  08/21/2016 4:34 PM

## 2016-08-10 NOTE — ED Notes (Signed)
Attempted report x1. 

## 2016-08-11 ENCOUNTER — Inpatient Hospital Stay (HOSPITAL_COMMUNITY): Payer: Medicare HMO

## 2016-08-11 ENCOUNTER — Telehealth: Payer: Self-pay | Admitting: *Deleted

## 2016-08-11 ENCOUNTER — Other Ambulatory Visit (HOSPITAL_COMMUNITY): Payer: Medicare HMO

## 2016-08-11 LAB — PHOSPHORUS: Phosphorus: 1.5 mg/dL — ABNORMAL LOW (ref 2.5–4.6)

## 2016-08-11 LAB — BLOOD GAS, ARTERIAL
Acid-base deficit: 2.4 mmol/L — ABNORMAL HIGH (ref 0.0–2.0)
Bicarbonate: 20.6 mmol/L (ref 20.0–28.0)
Drawn by: 36277
FIO2: 50
O2 Saturation: 99.1 %
PATIENT TEMPERATURE: 98.6
PCO2 ART: 27.9 mmHg — AB (ref 32.0–48.0)
PEEP: 5 cmH2O
PO2 ART: 164 mmHg — AB (ref 83.0–108.0)
RATE: 14 resp/min
VT: 620 mL
pH, Arterial: 7.481 — ABNORMAL HIGH (ref 7.350–7.450)

## 2016-08-11 LAB — BLOOD CULTURE ID PANEL (REFLEXED)
Acinetobacter baumannii: NOT DETECTED
CANDIDA KRUSEI: NOT DETECTED
CANDIDA PARAPSILOSIS: NOT DETECTED
CANDIDA TROPICALIS: NOT DETECTED
Candida albicans: NOT DETECTED
Candida glabrata: NOT DETECTED
ESCHERICHIA COLI: NOT DETECTED
Enterobacter cloacae complex: NOT DETECTED
Enterobacteriaceae species: NOT DETECTED
Enterococcus species: DETECTED — AB
HAEMOPHILUS INFLUENZAE: NOT DETECTED
KLEBSIELLA OXYTOCA: NOT DETECTED
KLEBSIELLA PNEUMONIAE: NOT DETECTED
Listeria monocytogenes: NOT DETECTED
Neisseria meningitidis: NOT DETECTED
PSEUDOMONAS AERUGINOSA: NOT DETECTED
Proteus species: NOT DETECTED
SERRATIA MARCESCENS: NOT DETECTED
STAPHYLOCOCCUS AUREUS BCID: NOT DETECTED
STAPHYLOCOCCUS SPECIES: NOT DETECTED
STREPTOCOCCUS PNEUMONIAE: NOT DETECTED
STREPTOCOCCUS PYOGENES: NOT DETECTED
Streptococcus agalactiae: NOT DETECTED
Streptococcus species: NOT DETECTED
Vancomycin resistance: NOT DETECTED

## 2016-08-11 LAB — BASIC METABOLIC PANEL
Anion gap: 14 (ref 5–15)
Anion gap: 17 — ABNORMAL HIGH (ref 5–15)
BUN: 7 mg/dL (ref 6–20)
BUN: 8 mg/dL (ref 6–20)
CALCIUM: 7.4 mg/dL — AB (ref 8.9–10.3)
CALCIUM: 8 mg/dL — AB (ref 8.9–10.3)
CO2: 17 mmol/L — ABNORMAL LOW (ref 22–32)
CO2: 19 mmol/L — ABNORMAL LOW (ref 22–32)
CREATININE: 1.09 mg/dL (ref 0.61–1.24)
CREATININE: 0.9 mg/dL (ref 0.61–1.24)
Chloride: 83 mmol/L — ABNORMAL LOW (ref 101–111)
Chloride: 86 mmol/L — ABNORMAL LOW (ref 101–111)
GFR calc non Af Amer: >60 mL/min (ref >60)
Glucose, Bld: 120 mg/dL — ABNORMAL HIGH (ref 65–99)
Glucose, Bld: 96 mg/dL (ref 65–99)
Potassium: 3.9 mmol/L (ref 3.5–5.1)
Potassium: 5 mmol/L (ref 3.5–5.1)
SODIUM: 117 mmol/L — AB (ref 135–145)
SODIUM: 119 mmol/L — AB (ref 135–145)

## 2016-08-11 LAB — URINE CULTURE: CULTURE: NO GROWTH

## 2016-08-11 LAB — GLUCOSE, CAPILLARY
GLUCOSE-CAPILLARY: 99 mg/dL (ref 65–99)
GLUCOSE-CAPILLARY: 83 mg/dL (ref 65–99)

## 2016-08-11 LAB — CBC
HCT: 36.6 % — ABNORMAL LOW (ref 39.0–52.0)
Hemoglobin: 12.7 g/dL — ABNORMAL LOW (ref 13.0–17.0)
MCH: 30.8 pg (ref 26.0–34.0)
MCHC: 34.7 g/dL (ref 30.0–36.0)
MCV: 88.6 fL (ref 78.0–100.0)
PLATELETS: 252 10*3/uL (ref 150–400)
RBC: 4.13 MIL/uL — ABNORMAL LOW (ref 4.22–5.81)
RDW: 24.5 % — AB (ref 11.5–15.5)
WBC: 7.5 10*3/uL (ref 4.0–10.5)

## 2016-08-11 LAB — MAGNESIUM: Magnesium: 1.7 mg/dL (ref 1.7–2.4)

## 2016-08-11 LAB — HIV ANTIBODY (ROUTINE TESTING W REFLEX): HIV Screen 4th Generation wRfx: NONREACTIVE

## 2016-08-11 LAB — TROPONIN I
Troponin I: 0.36 ng/mL (ref <0.03)
Troponin I: 0.48 ng/mL (ref <0.03)

## 2016-08-11 LAB — LACTIC ACID, PLASMA: Lactic Acid, Venous: 6.9 mmol/L (ref 0.5–1.9)

## 2016-08-11 LAB — MRSA PCR SCREENING: MRSA by PCR: NEGATIVE

## 2016-08-11 MED ORDER — ORAL CARE MOUTH RINSE
15.0000 mL | Freq: Four times a day (QID) | OROMUCOSAL | Status: DC
  Administered 2016-08-11 – 2016-08-12 (×4): 15 mL via OROMUCOSAL

## 2016-08-11 MED ORDER — MORPHINE BOLUS VIA INFUSION
5.0000 mg | INTRAVENOUS | Status: DC | PRN
  Filled 2016-08-11: qty 20

## 2016-08-11 MED ORDER — VALPROATE SODIUM 500 MG/5ML IV SOLN
500.0000 mg | Freq: Two times a day (BID) | INTRAVENOUS | Status: DC
  Administered 2016-08-11: 500 mg via INTRAVENOUS
  Filled 2016-08-11 (×2): qty 5

## 2016-08-11 MED ORDER — PROPOFOL 1000 MG/100ML IV EMUL
0.0000 ug/kg/min | INTRAVENOUS | Status: DC
  Administered 2016-08-11: 20 ug/kg/min via INTRAVENOUS
  Administered 2016-08-11 (×3): 50 ug/kg/min via INTRAVENOUS
  Administered 2016-08-12: 45 ug/kg/min via INTRAVENOUS
  Administered 2016-08-12: 40 ug/kg/min via INTRAVENOUS
  Filled 2016-08-11 (×6): qty 100

## 2016-08-11 MED ORDER — CHLORHEXIDINE GLUCONATE 0.12% ORAL RINSE (MEDLINE KIT)
15.0000 mL | Freq: Two times a day (BID) | OROMUCOSAL | Status: DC
  Administered 2016-08-11 (×2): 15 mL via OROMUCOSAL

## 2016-08-11 MED ORDER — SODIUM CHLORIDE 0.9 % IV SOLN
0.0000 mg/h | INTRAVENOUS | Status: DC
  Administered 2016-08-11: 20 mg/h via INTRAVENOUS
  Filled 2016-08-11 (×2): qty 10

## 2016-08-11 MED ORDER — SODIUM PHOSPHATES 45 MMOLE/15ML IV SOLN
20.0000 mmol | Freq: Once | INTRAVENOUS | Status: DC
  Filled 2016-08-11: qty 6.67

## 2016-08-11 MED ORDER — SODIUM CHLORIDE 0.9 % IV BOLUS (SEPSIS): 1000.0000 mL | Freq: Once | INTRAVENOUS | Status: DC

## 2016-08-11 MED ORDER — MORPHINE SULFATE 50 MG/ML IV SOLN
10.0000 mg/h | INTRAVENOUS | Status: DC
  Administered 2016-08-11: 20 mg/h via INTRAVENOUS
  Filled 2016-08-11: qty 5
  Filled 2016-08-11: qty 10

## 2016-08-11 MED ORDER — SODIUM CHLORIDE 0.9 % IV BOLUS (SEPSIS): 500.0000 mL | Freq: Once | INTRAVENOUS | Status: DC

## 2016-08-11 NOTE — Progress Notes (Signed)
PHARMACY - PHYSICIAN COMMUNICATION CRITICAL VALUE ALERT - BLOOD CULTURE IDENTIFICATION (BCID)  Results for orders placed or performed during the hospital encounter of 08/09/2016  Blood Culture ID Panel (Reflexed) (Collected: 08/24/2016  2:45 PM)  Result Value Ref Range   Enterococcus species DETECTED (A) NOT DETECTED   Vancomycin resistance NOT DETECTED NOT DETECTED   Listeria monocytogenes NOT DETECTED NOT DETECTED   Staphylococcus species NOT DETECTED NOT DETECTED   Staphylococcus aureus NOT DETECTED NOT DETECTED   Streptococcus species NOT DETECTED NOT DETECTED   Streptococcus agalactiae NOT DETECTED NOT DETECTED   Streptococcus pneumoniae NOT DETECTED NOT DETECTED   Streptococcus pyogenes NOT DETECTED NOT DETECTED   Acinetobacter baumannii NOT DETECTED NOT DETECTED   Enterobacteriaceae species NOT DETECTED NOT DETECTED   Enterobacter cloacae complex NOT DETECTED NOT DETECTED   Escherichia coli NOT DETECTED NOT DETECTED   Klebsiella oxytoca NOT DETECTED NOT DETECTED   Klebsiella pneumoniae NOT DETECTED NOT DETECTED   Proteus species NOT DETECTED NOT DETECTED   Serratia marcescens NOT DETECTED NOT DETECTED   Haemophilus influenzae NOT DETECTED NOT DETECTED   Neisseria meningitidis NOT DETECTED NOT DETECTED   Pseudomonas aeruginosa NOT DETECTED NOT DETECTED   Candida albicans NOT DETECTED NOT DETECTED   Candida glabrata NOT DETECTED NOT DETECTED   Candida krusei NOT DETECTED NOT DETECTED   Candida parapsilosis NOT DETECTED NOT DETECTED   Candida tropicalis NOT DETECTED NOT DETECTED    Name of physician (or Provider) Contacted: [facility]  Changes to prescribed antibiotics required: Full comfort care. No abx needed.  Elenor Quinones, PharmD, BCPS Clinical Pharmacist Pager [phone] 08/11/2016 11:04 PM

## 2016-08-11 NOTE — Telephone Encounter (Signed)
Received voice mail message from wife stating,"please tell Dr. Alen Blew that Dashton is in the hospital and will probably not leave. He is at [facility]." This message will be given to MD through an in-basket message due to him being on vacation. He will be returning on July 17th.

## 2016-08-11 NOTE — Progress Notes (Signed)
PULMONARY / CRITICAL CARE MEDICINE   Name: Jacob Beck MRN: [id number] DOB: 1950-05-13    ADMISSION DATE:  08/13/2016 CONSULTATION DATE:  08/04/2016  REFERRING MD:  Billy Fischer  CHIEF COMPLAINT:  Cardiac Arrest  HISTORY OF PRESENT ILLNESS:   66 yo male with PMH h/o renal CA s/p unsuccessful surgical debulking and on palliative chemo with brain metastasis s/p radiation who was brought to [facility] ED 7/9 after out of hospital cardiac arrest. He was found unresponsive by his wife, down approximately 61min, received bystander CPR . Upon EMS arrival was found to be in PEA receiving epi x 2 along with roughly 9 minutes of ACLS prior to ROSC. Admitted to ICU after being intubated for airway protection.   SUBJECTIVE:  Overnight had continued myoclonus, improved with increased sedation. BP stable this morning. Wife very anxious.  VITAL SIGNS: BP 98/64   Pulse (!) 110   Temp (!) 102.9 F (39.4 C) (Oral) Comment: Anderson Malta, RN notified  Resp 14   Ht 5\' 8"  (1.727 m)   Wt 84.1 kg (185 lb 6.5 oz)   SpO2 97%   BMI 28.19 kg/m   HEMODYNAMICS:    VENTILATOR SETTINGS: Vent Mode: PRVC FiO2 (%):  [50 %-100 %] 50 % Set Rate:  [14 bmp-16 bmp] 14 bmp Vt Set:  [185 mL] 620 mL PEEP:  [5 cmH20] 5 cmH20 Plateau Pressure:  [9 cmH20-16 cmH20] 9 cmH20  INTAKE / OUTPUT: I/O last 3 completed shifts: In: 1839.6 [I.V.:1679.6; IV Piggyback:160] Out: 3000 [Urine:3000]  PHYSICAL EXAMINATION: General:  Elderly man, laying in bed on bed, NAD Neuro:  Unresponsive with small myoclonus of face and eyes.  HEENT:  L periorbital hematoma. Pupils fixed and unresponsive Cardiovascular:  RRR, no murmurs Lungs:  CTAB on vent, normal effort Abdomen:  Soft, nontender, nondistended, + bowel sounds Musculoskeletal:  Normal bulk and tone Skin:  LE cool to touch b/l  LABS:  BMET  Recent Labs Lab 08/11/2016 1953 08/11/16 0002 08/11/16 0421  NA 117* 119* 117*  K 3.8 3.9 5.0  CL 81* 83* 86*  CO2 20* 19* 17*  BUN 6  7 8   CREATININE 0.84 0.90 1.09  GLUCOSE 156* 120* 96    Electrolytes  Recent Labs Lab 08/11/2016 1701 08/30/2016 1953 08/11/16 0002 08/11/16 0421  CALCIUM  --  7.7* 8.0* 7.4*  MG 1.5*  --   --  1.7  PHOS 2.6  --   --  1.5*    CBC  Recent Labs Lab 08/19/2016 1403 08/11/16 0421  WBC 5.6 7.5  HGB 11.0* 12.7*  HCT 34.2* 36.6*  PLT 313 252    Coag's  Recent Labs Lab 08/11/2016 1701  INR 1.15    Sepsis Markers  Recent Labs Lab 08/20/2016 1417 08/09/2016 1953 08/11/16 0002  LATICACIDVEN 9.14* 6.0* 6.9*    ABG  Recent Labs Lab 08/17/2016 1653 08/11/16 0345  PHART 7.371 7.481*  PCO2ART 32.8 27.9*  PO2ART 386.0* 164*    Liver Enzymes  Recent Labs Lab 08/11/2016 1403  AST 104*  ALT 64*  ALKPHOS 107  BILITOT 0.5  ALBUMIN 2.2*    Cardiac Enzymes  Recent Labs Lab 08/14/2016 1701 08/11/16 0002 08/11/16 0421  TROPONINI 0.25* 0.48* 0.36*    Glucose  Recent Labs Lab 08/17/2016 1959 08/04/2016 2312 08/11/16 0309  GLUCAP 169* 115* 99    Imaging Ct Head Wo Contrast  Result Date: 09/01/2016 CLINICAL DATA:  Ct head/cspine wo, cardiac arrest; unwitnessed fall EXAM: CT HEAD WITHOUT CONTRAST CT CERVICAL SPINE WITHOUT  CONTRAST TECHNIQUE: Multidetector CT imaging of the head and cervical spine was performed following the standard protocol without intravenous contrast. Multiplanar CT image reconstructions of the cervical spine were also generated. COMPARISON:  05/16/2016 01/07/2016 FINDINGS: CT HEAD FINDINGS Brain: There is high attenuation along the tentorium bilaterally, left greater than right. High attenuation is also identified along the falx, greater along the posterior aspect. The findings are consistent with small subdural hemorrhage. There is small amount of subarachnoid or parenchymal hemorrhage in the temporal lobes. There are focal calcifications within the right frontal lobe (image 17 in 26 of series 4), consistent with known and treated metastatic disease.  These appear stable. Vascular: There is mild atherosclerotic calcification of the internal carotid arteries. Skull: No acute calvarial fracture. There is a lytic lesion involving the medial wall of the left orbit, contiguous with the left frontal sinus which has developed since prior study. Considerations include metastatic disease or aggressive sinus disease. There is associated soft tissue density in this region along the medial aspect of the left orbit. There is an irregular lytic lesion in the right frontal bone, measuring 6 mm on image 77 of series 5. Lesion is larger compared prior study, suspicious for lytic metastatic lesion. Sinuses/Orbits: There is significant sinus opacity involving the ethmoid, maxillary, and frontal sinuses. There is significant density involving the left frontal sinus and medial wall of the left orbit, associated with osseous destruction, as above. Mastoid air cells are normally aerated. Other: Significant scalp hematoma across the vertex and occipital regions. CT CERVICAL SPINE FINDINGS Alignment: Alignment is normal. Skull base and vertebrae: There is a fracture involving the lateral mass of C6. There is widening of the facet joint at C5-6 on the right as result of the fracture. The right transverse foramen is also disrupted, raising question of integrity of the right vertebral artery. The vertebral body is intact. Soft tissues and spinal canal: Endotracheal tube and nasogastric tube are in place. Disc levels: Degenerative changes are identified throughout the cervical spine. Upper chest: Negative. Other: None IMPRESSION: 1. Small subdural hemorrhage seen as a small amount of blood along the tentorium and falx. 2. Small amount of subarachnoid or intraparenchymal blood involving the temporal lobes bilaterally. 3. Fracture of the right lateral mass of C6 with widening of the facet joint on the right at C5-6 and involvement of the right lateral foramen. Recommend CTA to evaluate the  vertebral artery. 4. Osseous destruction of the left frontal bone and medial wall the left orbit, appearance favoring metastatic disease but possibly representing aggressive sinus disease. This is not an acute fracture. 5. Suspect osseous metastatic lesion in the right frontal bone, larger compared with prior study. 6. Stable appearance of treated metastases in the brain. Critical Value/emergent results were called by telephone at the time of interpretation on 08/09/2016 at 3:25 pm to Dr. Billy Fischer, who verbally acknowledged these results. Electronically Signed   By: Nolon Nations M.D.   On: 08/07/2016 15:40   Ct Cervical Spine Wo Contrast  Result Date: 08/31/2016 CLINICAL DATA:  Ct head/cspine wo, cardiac arrest; unwitnessed fall EXAM: CT HEAD WITHOUT CONTRAST CT CERVICAL SPINE WITHOUT CONTRAST TECHNIQUE: Multidetector CT imaging of the head and cervical spine was performed following the standard protocol without intravenous contrast. Multiplanar CT image reconstructions of the cervical spine were also generated. COMPARISON:  05/16/2016 01/07/2016 FINDINGS: CT HEAD FINDINGS Brain: There is high attenuation along the tentorium bilaterally, left greater than right. High attenuation is also identified along the falx, greater along the  posterior aspect. The findings are consistent with small subdural hemorrhage. There is small amount of subarachnoid or parenchymal hemorrhage in the temporal lobes. There are focal calcifications within the right frontal lobe (image 17 in 26 of series 4), consistent with known and treated metastatic disease. These appear stable. Vascular: There is mild atherosclerotic calcification of the internal carotid arteries. Skull: No acute calvarial fracture. There is a lytic lesion involving the medial wall of the left orbit, contiguous with the left frontal sinus which has developed since prior study. Considerations include metastatic disease or aggressive sinus disease. There is  associated soft tissue density in this region along the medial aspect of the left orbit. There is an irregular lytic lesion in the right frontal bone, measuring 6 mm on image 77 of series 5. Lesion is larger compared prior study, suspicious for lytic metastatic lesion. Sinuses/Orbits: There is significant sinus opacity involving the ethmoid, maxillary, and frontal sinuses. There is significant density involving the left frontal sinus and medial wall of the left orbit, associated with osseous destruction, as above. Mastoid air cells are normally aerated. Other: Significant scalp hematoma across the vertex and occipital regions. CT CERVICAL SPINE FINDINGS Alignment: Alignment is normal. Skull base and vertebrae: There is a fracture involving the lateral mass of C6. There is widening of the facet joint at C5-6 on the right as result of the fracture. The right transverse foramen is also disrupted, raising question of integrity of the right vertebral artery. The vertebral body is intact. Soft tissues and spinal canal: Endotracheal tube and nasogastric tube are in place. Disc levels: Degenerative changes are identified throughout the cervical spine. Upper chest: Negative. Other: None IMPRESSION: 1. Small subdural hemorrhage seen as a small amount of blood along the tentorium and falx. 2. Small amount of subarachnoid or intraparenchymal blood involving the temporal lobes bilaterally. 3. Fracture of the right lateral mass of C6 with widening of the facet joint on the right at C5-6 and involvement of the right lateral foramen. Recommend CTA to evaluate the vertebral artery. 4. Osseous destruction of the left frontal bone and medial wall the left orbit, appearance favoring metastatic disease but possibly representing aggressive sinus disease. This is not an acute fracture. 5. Suspect osseous metastatic lesion in the right frontal bone, larger compared with prior study. 6. Stable appearance of treated metastases in the brain.  Critical Value/emergent results were called by telephone at the time of interpretation on 08/19/2016 at 3:25 pm to Dr. Billy Fischer, who verbally acknowledged these results. Electronically Signed   By: Nolon Nations M.D.   On: 08/18/2016 15:40   Dg Chest Port 1 View  Result Date: 08/11/2016 CLINICAL DATA:  Hypoxia EXAM: PORTABLE CHEST 1 VIEW COMPARISON:  August 10, 2016 FINDINGS: Endotracheal tube tip is 7.3 cm above the carina. Nasogastric tube tip and side port are in the stomach. No pneumothorax. There is focal consolidation in the medial left base. Lungs elsewhere are clear. Heart size and pulmonary vascularity are normal. No adenopathy. No bone lesions. IMPRESSION: Tube positions as described without evident pneumothorax. Medial left base consolidation. Pneumonia in this area suspected. Lungs elsewhere clear. Stable cardiac silhouette. Electronically Signed   By: Lowella Grip III M.D.   On: 08/11/2016 07:04   Dg Chest Portable 1 View  Result Date: 08/28/2016 CLINICAL DATA:  66 y/o  M; status post intubation. EXAM: PORTABLE CHEST 1 VIEW COMPARISON:  04/24/2016 chest CT.  03/27/2016 chest radiograph. FINDINGS: Mildly enlarged cardiac silhouette. Endotracheal tube 2.8 cm from carina.  Enteric tube tip stented gastroesophageal junction, advancement recommended. Transcutaneous pacing pads. Patchy pulmonary opacities with central predominance. No pleural effusion or pneumothorax. No acute osseous abnormality is evident. IMPRESSION: 1. Moderate pulmonary edema.  Underlying pneumonia not excluded. 2. Endotracheal tube 2.8 cm from carina. 3. Enteric tube tip at the gastroesophageal junction, advancement recommended. These results will be called to the ordering clinician or representative by the Radiologist Assistant, and communication documented in the PACS or zVision Dashboard. Electronically Signed   By: Kristine Garbe M.D.   On: 08/30/2016 14:20     STUDIES:  CT head and C spine 7/9 > small  subdural hemorrhage with small subarachnoid/intraparenchymal blood at b/l temporal lobes. Fracture of R lateral mass of C6 with widening of R C5-6 facet joint. Osseous destruction of the L frontal bone and medial wall L orbit, not acute fracture, suspect metastatic disease. Stable treated metastases in brain R front lobe CXR 7/9 > pulmonary edema. CXR 7/10 > focal consolidation in medial L base. No pneumothorax.  CULTURES: Blood cultures 7/9 > Urine culture 7/9 > MRSA PCR 7/9 neg  ANTIBIOTICS: Vanc/Zosyn 7/9 - 7/9  SIGNIFICANT EVENTS: 07/09 Admitted after cardiac arrest   LINES/TUBES: ETT 7/9 > NGT 7/9 > Foley 7/10 > PIV x2  DISCUSSION: 66 y.o. male with hx of RCC (2016) s/p chemo and attempted surgical resection (failed due to size of tumor) and brain mets (2017) s/p whole brain radiation, admitted 7/9 after PEA cardiac arrest with roughly 9 minutes of ACLS (unknown downtime prior to start of ACLS but max 15 min). Of note, oncology notes mention that Mr. Zeoli disease is incurable. Patient is on palliative chemo.  ASSESSMENT / PLAN:  PULMONARY A: Acute Respiratory Insufficiency in the setting of cardiac arrest P:   Mechanical ventilation VAP prevention SBT if mental status improves, hold off currently  CARDIOVASCULAR A:  PEA arrest - unknown downtime (max 15 min) with 9 minutes ACLS before ROSC. Hypertensive urgency, improved P:  Trend troponin, lactate. Monitor BP, goal SBP <170 Not candidate for targeted temp management  RENAL A:   Hyponatremia - 2/2 chemo vs beer potomania vs SIADH Metabolic acidosis P:   NS @ 125 BMP q4hrs Serum osmol 257 low, Urine Na 101, Urine osmol 308  GASTROINTESTINAL A:   GERD Nutrition Abdominal pain P:   Continue Pantoprazole. NPO.  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 NO progression on recent MRI Mild anemia - chronic, stable VTE Prophylaxis. P:  Transfuse for Hgb < 7. SCD's Monitor  CBC  INFECTIOUS A:   No indication of infection P:   Defer abx for now Follow cultures as above  ENDOCRINE A:   Hyperglycemia - no hx DM  P:   SSI Monitor CBG  NEUROLOGIC A:   Acute encephalopathy likely anoxic brain injury with myoclonus Small SDH at tentorium and falx. Small SAH vs IPH bilateral temporal lobes P:   RASS goal: 0 to -1. Sedation:  Propofol infusion. PRN versed Per neurosurgery, no intervention EEG canceled d/t poor prognosis  CT angio neck to assess vertebral artery if proceeding with further work up   FAMILY  - Updates: Wife updated  - Inter-disciplinary family meet or Palliative Care meeting due by:  08/16/16  Bufford Lope, DO PGY-2, [facility] 08/11/2016 7:35 AM

## 2016-08-11 NOTE — Procedures (Signed)
Extubation Procedure Note  Patient Details:   Name: ELAND LAMANTIA DOB: 1951-01-07 MRN: [id number]   Airway Documentation:     Evaluation  O2 sats: stable throughout Complications: No apparent complications Patient did tolerate procedure well. Bilateral Breath Sounds: Rhonchi   No   Patient extubated due to comfort care order. RN at bedside with RT during extubation.  Renato Gails Evelyn Moch 08/11/2016, 12:01 PM

## 2016-08-11 NOTE — Progress Notes (Signed)
CRITICAL VALUE ALERT  Critical Value:  LA 6.9 & Na 11.9  Date & Time Notied:  7.10.18  0105  Provider Notified: Warren Lacy   Orders Received/Actions taken:  None at this time

## 2016-08-11 NOTE — Plan of Care (Signed)
  Interdisciplinary Goals of Care Family Meeting   Date carried out:: 08/11/2016  Location of the meeting: Bedside  Member's involved: Physician and Bedside Registered Nurse  Durable Power of Attorney or acting medical decision maker: Wife    Discussion: We discussed goals of care for Jacob Beck . Discussed with wife, brother and updated about the poor prognosis. The family make it clear that he would not want to be supported on vent. They have decided to make him comfort with withdrawal of care.   Code status: Full DNR  Disposition: In-patient comfort care  Time spent for the meeting: 25  Jacob Beck 08/11/2016, 11:42 AM

## 2016-08-11 NOTE — Progress Notes (Signed)
   08/11/16 1005  Clinical Encounter Type  Visited With Patient and family together  Visit Type Spiritual support  Spiritual Encounters  Spiritual Needs Prayer;Emotional  Stress Factors  Patient Stress Factors Major life changes  Family Stress Factors Exhausted  Paged from nurse for family. Pt seen yesterday in ED. Offered prayer for Pt and family.

## 2016-08-13 LAB — CULTURE, BLOOD (ROUTINE X 2): Special Requests: ADEQUATE

## 2016-08-15 LAB — CULTURE, BLOOD (ROUTINE X 2)
CULTURE: NO GROWTH
Special Requests: ADEQUATE

## 2016-08-18 ENCOUNTER — Other Ambulatory Visit: Payer: Medicare HMO

## 2016-08-19 ENCOUNTER — Ambulatory Visit: Payer: Medicare HMO | Admitting: Oncology

## 2016-09-02 NOTE — Death Summary Note (Signed)
DEATH SUMMARY   Patient Details  Name: Jacob Beck MRN: [id number] DOB: November 12, 1950  Admission/Discharge Information   Admit Date:  Sep 07, 2016  Date of Death: Date of Death: 2016/09/09  Time of Death: Time of Death: May 30, 1122  Length of Stay: 2  Referring Physician: Antony Contras, MD   Reason(s) for Hospitalization  Cardiac arrest  Diagnoses  Preliminary cause of death:  Secondary Diagnoses (including complications and co-morbidities):  Active Problems:   Cardiac arrest [facility])   Brief Hospital Course (including significant findings, care, treatment, and services provided and events leading to death)  HISAO DOO is a 66 y.o. year old male PMH h/o renal CA s/p unsuccessful surgical debulking and on second line palliative chemo with brain metastasis s/p radiation who was brought to [facility] ED 08-Sep-2022 after out of hospital cardiac arrest. He was found unresponsive by his wife, down approximately 50min, received bystander CPR . Upon EMS arrival was found to be in PEA receiving epi x 2 along with roughly 9 minutes of ACLS prior to ROSC. Admitted to ICU after being intubated for airway protection. He was found to have small subdural, subarachnoid hemorrhage, fracture of C6 on CT. He did not improve neurologically and continued to have myoclonic activity from anoxic brain injury. After family discussion, wife decided to withdraw care according to patient's previously voiced wishes. Patient died with family at bedside.      Pertinent Labs and Studies  Significant Diagnostic Studies Ct Head Wo Contrast Ct Cervical Spine Wo Contrast Result Date: 07-Sep-2016 IMPRESSION: 1. Small subdural hemorrhage seen as a small amount of blood along the tentorium and falx. 2. Small amount of subarachnoid or intraparenchymal blood involving the temporal lobes bilaterally. 3. Fracture of the right lateral mass of C6 with widening of the facet joint on the right at C5-6 and involvement of the right lateral foramen.  Recommend CTA to evaluate the vertebral artery. 4. Osseous destruction of the left frontal bone and medial wall the left orbit, appearance favoring metastatic disease but possibly representing aggressive sinus disease. This is not an acute fracture. 5. Suspect osseous metastatic lesion in the right frontal bone, larger compared with prior study. 6. Stable appearance of treated metastases in the brain. Critical Value/emergent results were called by telephone at the time of interpretation on 07-Sep-2016 at 3:25 pm to Dr. Billy Fischer, who verbally acknowledged these results. Electronically Signed   By: Nolon Nations M.D.   On: 07-Sep-2016 15:40    Dg Chest Port 1 View Result Date: 08/11/2016 IMPRESSION: Tube positions as described without evident pneumothorax. Medial left base consolidation. Pneumonia in this area suspected. Lungs elsewhere clear. Stable cardiac silhouette. Electronically Signed   By: Lowella Grip III M.D.   On: 08/11/2016 07:04   Microbiology Recent Results (from the past 240 hour(s))  Blood Culture (routine x 2)     Status: None (Preliminary result)   Collection Time: 09/07/2016  2:15 PM  Result Value Ref Range Status   Specimen Description BLOOD LEFT FOREARM  Final   Special Requests   Final    BOTTLES DRAWN AEROBIC AND ANAEROBIC Blood Culture adequate volume   Culture NO GROWTH 2 DAYS  Final   Report Status PENDING  Incomplete  Blood Culture (routine x 2)     Status: Abnormal (Preliminary result)   Collection Time: 09-07-16  2:45 PM  Result Value Ref Range Status   Specimen Description BLOOD RIGHT FOREARM  Final   Special Requests   Final    BOTTLES DRAWN  AEROBIC AND ANAEROBIC Blood Culture adequate volume   Culture  Setup Time   Final    GRAM POSITIVE COCCI IN CHAINS IN PAIRS ANAEROBIC BOTTLE ONLY CRITICAL RESULT CALLED TO, READ BACK BY AND VERIFIED WITH: Karlene Einstein PHARMD 2300 08/11/16 A BROWNING    Culture ENTEROCOCCUS FAECIUM (A)  Final   Report Status PENDING   Incomplete  Urine culture     Status: None   Collection Time: 08/17/2016  2:45 PM  Result Value Ref Range Status   Specimen Description URINE, RANDOM  Final   Special Requests NONE  Final   Culture NO GROWTH  Final   Report Status 08/11/2016 FINAL  Final  Blood Culture ID Panel (Reflexed)     Status: Abnormal   Collection Time: 08/25/2016  2:45 PM  Result Value Ref Range Status   Enterococcus species DETECTED (A) NOT DETECTED Final    Comment: CRITICAL RESULT CALLED TO, READ BACK BY AND VERIFIED WITH: Karlene Einstein PHARMD 2300 08/11/16 A BROWNING    Vancomycin resistance NOT DETECTED NOT DETECTED Final   Listeria monocytogenes NOT DETECTED NOT DETECTED Final   Staphylococcus species NOT DETECTED NOT DETECTED Final   Staphylococcus aureus NOT DETECTED NOT DETECTED Final   Streptococcus species NOT DETECTED NOT DETECTED Final   Streptococcus agalactiae NOT DETECTED NOT DETECTED Final   Streptococcus pneumoniae NOT DETECTED NOT DETECTED Final   Streptococcus pyogenes NOT DETECTED NOT DETECTED Final   Acinetobacter baumannii NOT DETECTED NOT DETECTED Final   Enterobacteriaceae species NOT DETECTED NOT DETECTED Final   Enterobacter cloacae complex NOT DETECTED NOT DETECTED Final   Escherichia coli NOT DETECTED NOT DETECTED Final   Klebsiella oxytoca NOT DETECTED NOT DETECTED Final   Klebsiella pneumoniae NOT DETECTED NOT DETECTED Final   Proteus species NOT DETECTED NOT DETECTED Final   Serratia marcescens NOT DETECTED NOT DETECTED Final   Haemophilus influenzae NOT DETECTED NOT DETECTED Final   Neisseria meningitidis NOT DETECTED NOT DETECTED Final   Pseudomonas aeruginosa NOT DETECTED NOT DETECTED Final   Candida albicans NOT DETECTED NOT DETECTED Final   Candida glabrata NOT DETECTED NOT DETECTED Final   Candida krusei NOT DETECTED NOT DETECTED Final   Candida parapsilosis NOT DETECTED NOT DETECTED Final   Candida tropicalis NOT DETECTED NOT DETECTED Final  MRSA PCR Screening      Status: None   Collection Time: 08/17/2016  8:17 PM  Result Value Ref Range Status   MRSA by PCR NEGATIVE NEGATIVE Final    Comment:        The GeneXpert MRSA Assay (FDA approved for NASAL specimens only), is one component of a comprehensive MRSA colonization surveillance program. It is not intended to diagnose MRSA infection nor to guide or monitor treatment for MRSA infections.     Lab Basic Metabolic Panel:  Recent Labs Lab 08/11/2016 1403 08/16/2016 1701 08/09/2016 1953 08/11/16 0002 08/11/16 0421  NA 115*  --  117* 119* 117*  K 3.5  --  3.8 3.9 5.0  CL 83*  --  81* 83* 86*  CO2 15*  --  20* 19* 17*  GLUCOSE 204*  --  156* 120* 96  BUN <5*  --  6 7 8   CREATININE 0.62  --  0.84 0.90 1.09  CALCIUM 7.4*  --  7.7* 8.0* 7.4*  MG  --  1.5*  --   --  1.7  PHOS  --  2.6  --   --  1.5*   Liver Function Tests:  Recent Labs Lab  08/15/2016 1403  AST 104*  ALT 64*  ALKPHOS 107  BILITOT 0.5  PROT 5.5*  ALBUMIN 2.2*    Recent Labs Lab 08/08/2016 1403  LIPASE 27   No results for input(s): AMMONIA in the last 168 hours. CBC:  Recent Labs Lab 08/07/2016 1403 08/11/16 0421  WBC 5.6 7.5  NEUTROABS 3.0  --   HGB 11.0* 12.7*  HCT 34.2* 36.6*  MCV 91.9 88.6  PLT 313 252   Cardiac Enzymes:  Recent Labs Lab 08/27/2016 1701 08/11/16 0002 08/11/16 0421  TROPONINI 0.25* 0.48* 0.36*   Sepsis Labs:  Recent Labs Lab 08/11/2016 1403 08/15/2016 1417 08/22/2016 1953 08/11/16 0002 08/11/16 0421  WBC 5.6  --   --   --  7.5  LATICACIDVEN  --  9.14* 6.0* 6.9*  --     Procedures/Operations  None  Bufford Lope, DO PGY-2, [facility] 08/13/2016 11:01 AM

## 2016-09-02 NOTE — Progress Notes (Signed)
Pt went asystole at 1124.  No breath or heart tones auscultated- verified by two RN's Damita Dunnings and [employer].  Medical Examiner and CDS notified.  Tubes and lines removed per protocol and eyes prepped for potential donation.  Patients family at bedside during passing.

## 2016-09-02 NOTE — Progress Notes (Signed)
Nutrition Brief Note  Chart reviewed. Pt now transitioning to comfort care.  No nutrition interventions warranted at this time.  Please consult as needed.   Molli Barrows, RD, LDN, [place] Pager [phone] After Hours Pager [phone]

## 2016-09-02 NOTE — Progress Notes (Signed)
   08-26-2016 1020  Clinical Encounter Type  Visited With Patient and family together  Visit Type Follow-up  Spiritual Encounters  Spiritual Needs Emotional  Stress Factors  Patient Stress Factors Major life changes  Family Stress Factors Family relationships  Checked in on Pt. Brother and his wife present. [employer] of presence.

## 2016-09-02 NOTE — Progress Notes (Signed)
PULMONARY / CRITICAL CARE MEDICINE   Name: Jacob Beck MRN: [id number] DOB: 12/21/1950    ADMISSION DATE:  08/29/2016 CONSULTATION DATE:  08/28/2016  REFERRING MD:  Jacob Beck  CHIEF COMPLAINT:  Cardiac Arrest  HISTORY OF PRESENT ILLNESS:   66 yo male with PMH h/o renal CA s/p unsuccessful surgical debulking and on palliative chemo with brain metastasis s/p radiation who was brought to [facility] ED 7/9 after out of hospital cardiac arrest. He was found unresponsive by his wife, down approximately 3min, received bystander CPR . Upon EMS arrival was found to be in PEA receiving epi x 2 along with roughly 9 minutes of ACLS prior to ROSC. Admitted to ICU after being intubated for airway protection.   SUBJECTIVE:  Made comfort yesterday and extubated.  Increased WOB today with agonal efforts  VITAL SIGNS: BP (!) 152/83   Pulse (!) 107   Temp 98.2 F (36.8 C) (Axillary)   Resp 14   Ht 5\' 8"  (1.727 m)   Wt 185 lb 6.5 oz (84.1 kg)   SpO2 (!) 75%   BMI 28.19 kg/m   HEMODYNAMICS:    VENTILATOR SETTINGS:    INTAKE / OUTPUT: I/O last 3 completed shifts: In: 3588.6 [I.V.:3428.6; IV Piggyback:160] Out: 5573 [Urine:4515]  PHYSICAL EXAMINATION: Gen:      Agonal breathing HEENT:  EOMI, sclera anicteric Neck:     No masses; no thyromegaly Lungs:   B/L rhonchi; normal respiratory effort CV:         Regular rate and rhythm; no murmurs Abd:      + bowel sounds; soft, non-tender; no palpable masses, no distension Ext:    No edema; adequate peripheral perfusion Skin:      Warm and dry; no rash Neuro: Unresponsive. No pupillary reflex  LABS:  BMET  Recent Labs Lab 08/05/2016 1953 08/11/16 0002 08/11/16 0421  NA 117* 119* 117*  K 3.8 3.9 5.0  CL 81* 83* 86*  CO2 20* 19* 17*  BUN 6 7 8   CREATININE 0.84 0.90 1.09  GLUCOSE 156* 120* 96    Electrolytes  Recent Labs Lab 08/20/2016 1701 08/07/2016 1953 08/11/16 0002 08/11/16 0421  CALCIUM  --  7.7* 8.0* 7.4*  MG 1.5*  --   --   1.7  PHOS 2.6  --   --  1.5*    CBC  Recent Labs Lab 08/09/2016 1403 08/11/16 0421  WBC 5.6 7.5  HGB 11.0* 12.7*  HCT 34.2* 36.6*  PLT 313 252    Coag's  Recent Labs Lab 08/24/2016 1701  INR 1.15    Sepsis Markers  Recent Labs Lab 08/09/2016 1417 08/29/2016 1953 08/11/16 0002  LATICACIDVEN 9.14* 6.0* 6.9*    ABG  Recent Labs Lab 08/08/2016 1653 08/11/16 0345  PHART 7.371 7.481*  PCO2ART 32.8 27.9*  PO2ART 386.0* 164*    Liver Enzymes  Recent Labs Lab 08/27/2016 1403  AST 104*  ALT 64*  ALKPHOS 107  BILITOT 0.5  ALBUMIN 2.2*    Cardiac Enzymes  Recent Labs Lab 08/03/2016 1701 08/11/16 0002 08/11/16 0421  TROPONINI 0.25* 0.48* 0.36*    Glucose  Recent Labs Lab 08/25/2016 1959 08/20/2016 2312 08/11/16 0309 08/11/16 0746  GLUCAP 169* 115* 99 83    Imaging No results found.   STUDIES:  CT head and C spine 7/9 > small subdural hemorrhage with small subarachnoid/intraparenchymal blood at b/l temporal lobes. Fracture of R lateral mass of C6 with widening of R C5-6 facet joint. Osseous destruction of the L  frontal bone and medial wall L orbit, not acute fracture, suspect metastatic disease. Stable treated metastases in brain R front lobe CXR 7/9 > pulmonary edema. CXR 7/10 > focal consolidation in medial L base. No pneumothorax.  CULTURES: Blood cultures 7/9 > Urine culture 7/9 > MRSA PCR 7/9 neg  ANTIBIOTICS: Vanc/Zosyn 7/9 - 7/9  SIGNIFICANT EVENTS: 07/09 Admitted after cardiac arrest   LINES/TUBES: ETT 7/9 > NGT 7/9 > Foley 7/10 > PIV x2  DISCUSSION: 66 y.o. male with hx of RCC (2016) s/p chemo and attempted surgical resection (failed due to size of tumor) and brain mets (2017) s/p whole brain radiation, admitted 7/9 after PEA cardiac arrest with roughly 9 minutes of ACLS (unknown downtime prior to start of ACLS but max 15 min). 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 Patient is on palliative  chemo.  Severe anoxic injury with myoclonus. Made full comfort measures yesterday. Pt is on morphine drip  - Stop propofol. Increase morphine for comfort. - Transfer to 6N today. I expect he will pass away in Beck or 2  - Will get palliative team involved to help with end of life care. - Wife and family updated at bedside.   Jacob Garfinkel MD [facility] Pager [phone] If no answer or after 3pm call: [phone] 09/05/16, 12:09 PM

## 2016-09-02 DEATH — deceased

## 2016-09-16 ENCOUNTER — Other Ambulatory Visit: Payer: Self-pay | Admitting: Nurse Practitioner

## 2018-07-16 IMAGING — MR MR HEAD WO/W CM
10 of 13 series · 33 of 48 positions shown · IV contrast (Yes)
Comparison: None.

CLINICAL DATA: Renal cell carcinoma. Persistent headaches over the
last 2 months.

EXAM:
MRI HEAD WITHOUT AND WITH CONTRAST
TECHNIQUE: Multiplanar, multiecho pulse sequences of the brain and surrounding
structures were obtained without and with intravenous contrast.
CONTRAST:  16mL MULTIHANCE GADOBENATE DIMEGLUMINE 529 MG/ML IV SOLN

[Series 3: T1 · sagittal · 5.0mm · 0.47mm/px · 2 of 26 slices shown]
[im 1/26]
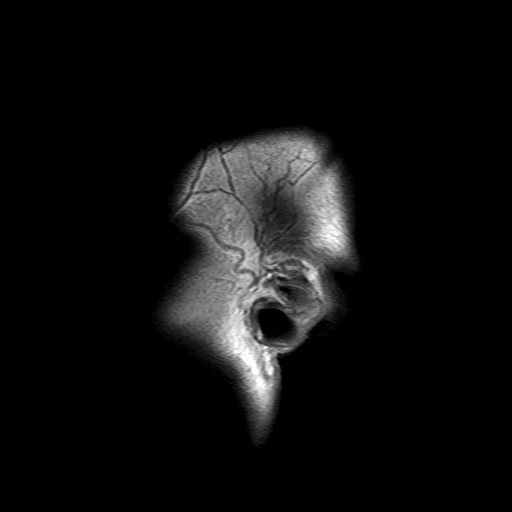
[im 13/26]
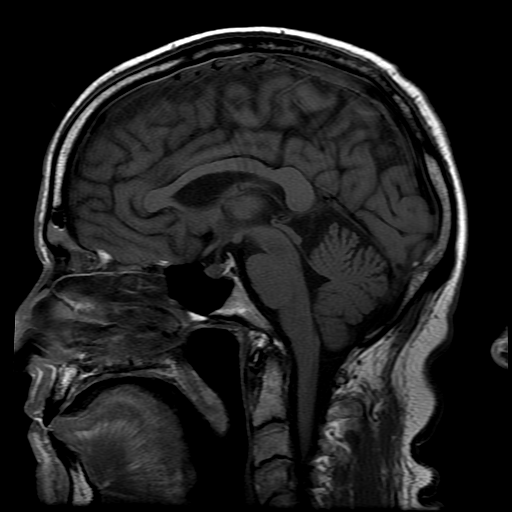

[Series 4: DWI · axial · 3.0mm · 1.09mm/px · z∈[-52,+109]mm · 8 of 110 slices shown (1 of 4)]
[im 1/110]
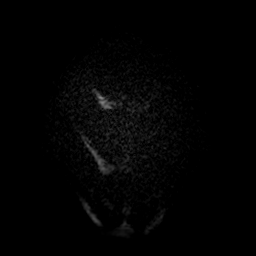
[im 13/110]
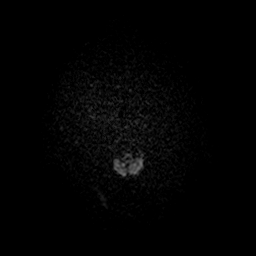
[im 37/110]
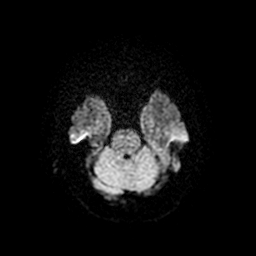
[im 49/110]
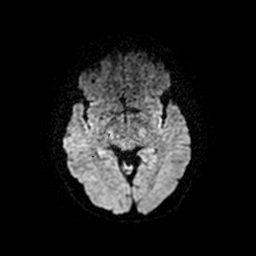
[im 61/110]
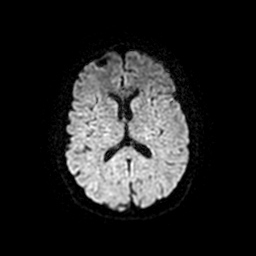
[im 73/110]
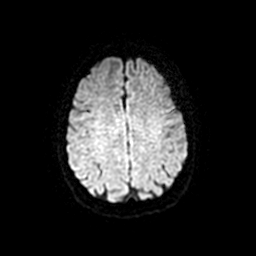
[im 97/110]
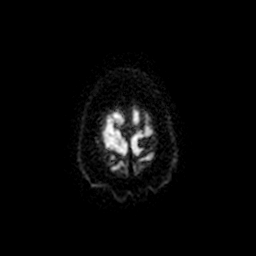
[im 110/110]
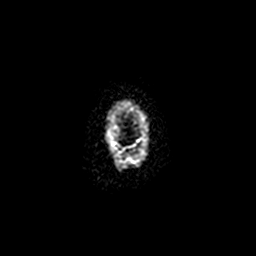

[Series 5: DWI · coronal · 5.0mm · 1.09mm/px · 6 of 74 slices shown (2 of 4)]
[im 1/74]
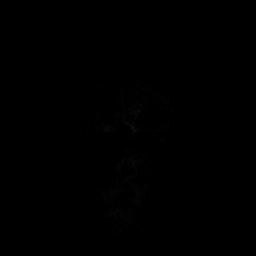
[im 15/74]
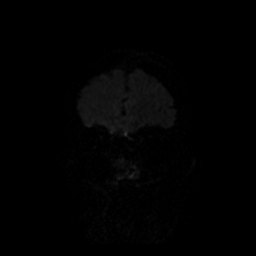
[im 30/74]
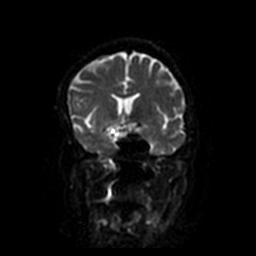
[im 44/74]
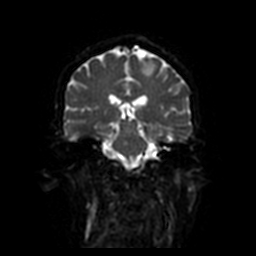
[im 59/74]
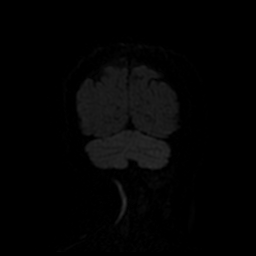
[im 74/74]
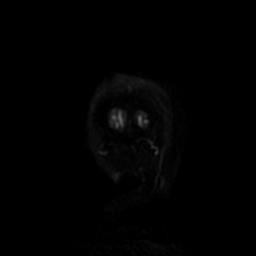

[Series 6: T2 · axial · 5.0mm · 0.43mm/px · z∈[-61,+114]mm · 2 of 28 slices shown]
[im 1/28]
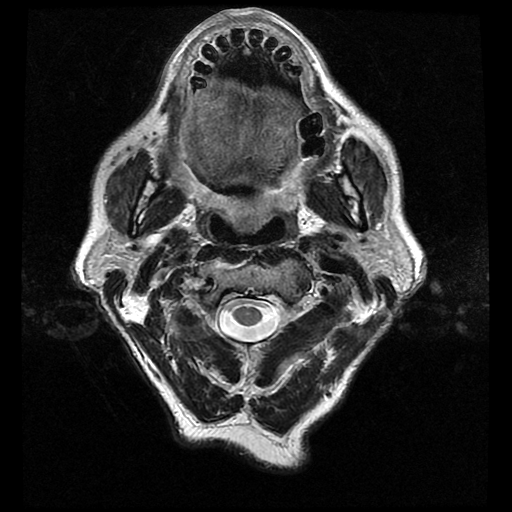
[im 28/28]
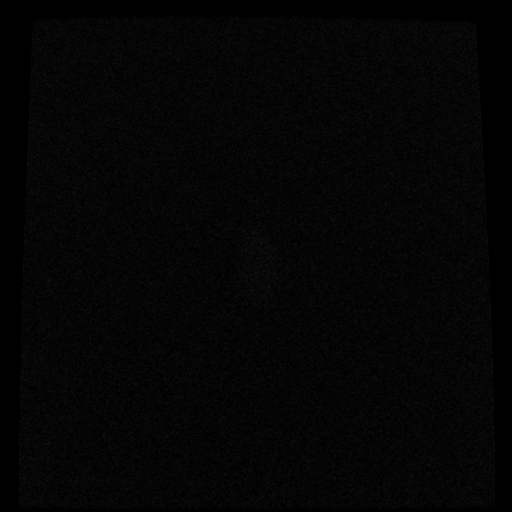

[Series 7: FLAIR · axial · 5.0mm · 0.43mm/px · z∈[-61,+114]mm · 2 of 28 slices shown]
[im 1/28]
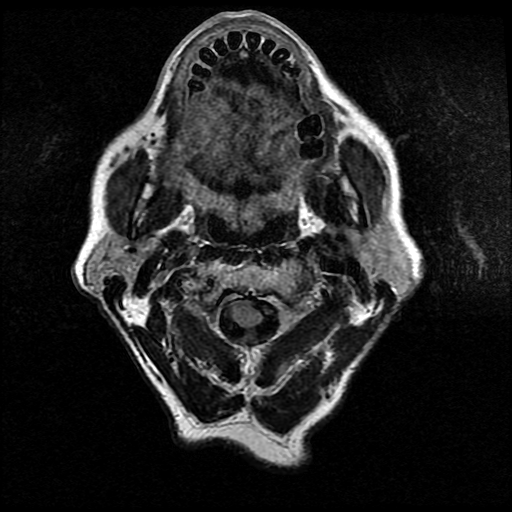
[im 28/28]
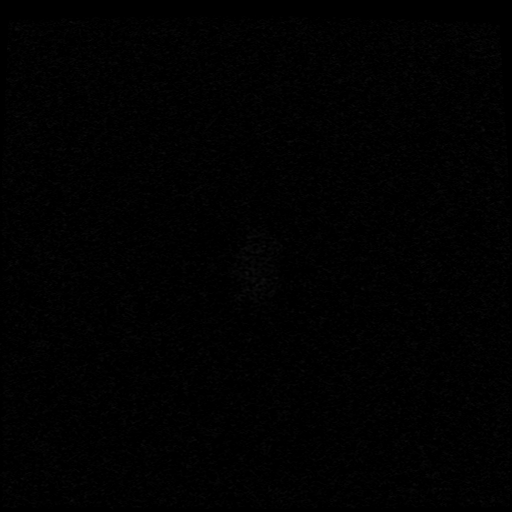

[Series 10: T2 post-contrast · coronal · 5.0mm · 0.45mm/px · 2 of 29 slices shown]
[im 1/29]
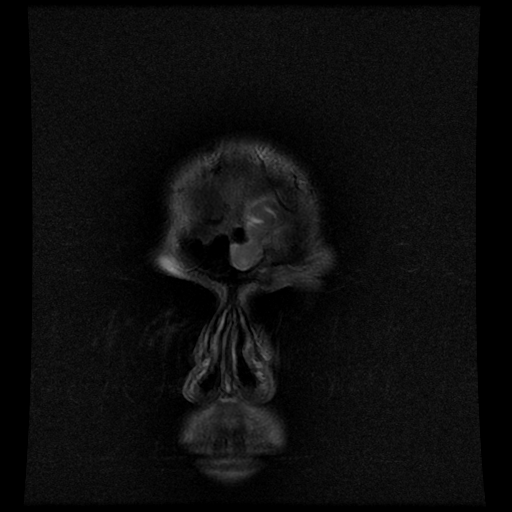
[im 29/29]
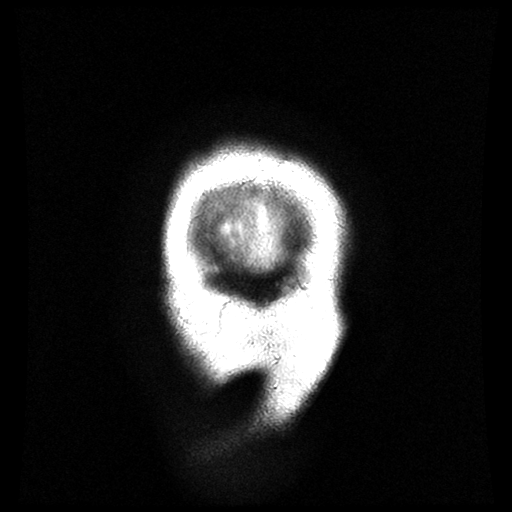

[Series 12: T1 post-contrast · coronal · 5.0mm · 0.45mm/px · 2 of 29 slices shown (1 of 2)]
[im 1/29]
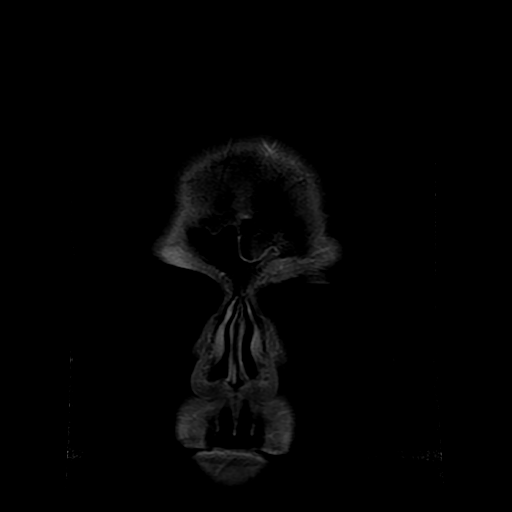
[im 29/29]
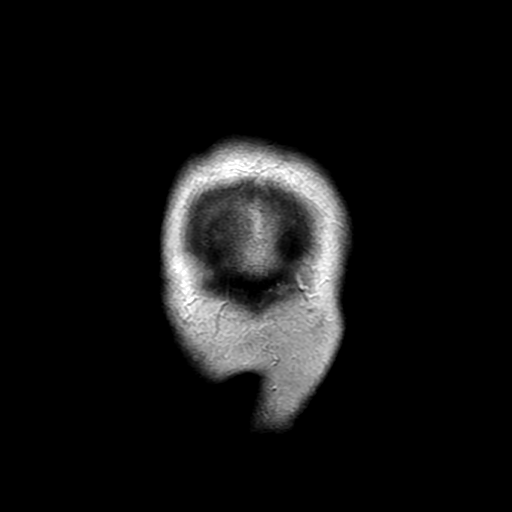

[Series 13: T1 post-contrast · sagittal · 5.0mm · 0.47mm/px · 2 of 26 slices shown (2 of 2)]
[im 1/26]
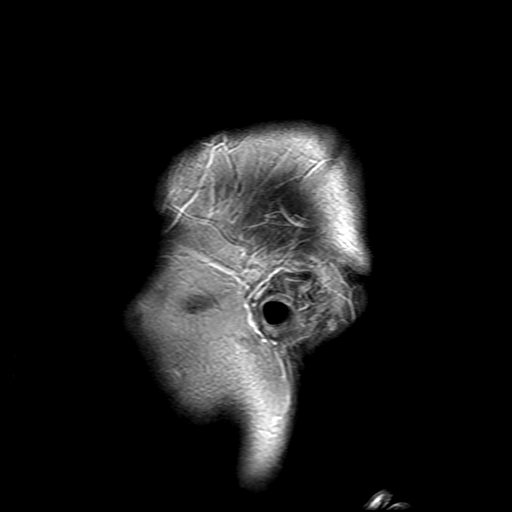
[im 26/26]
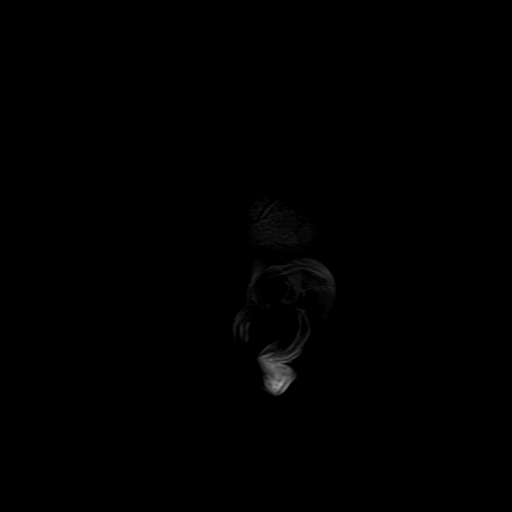

[Series 400: DWI · axial · 3.0mm · 1.09mm/px · z∈[-52,+109]mm · 4 of 54 slices shown (3 of 4)]
[im 1/54]
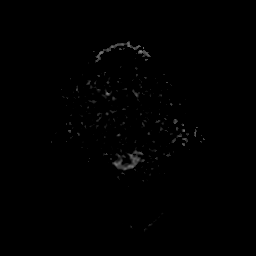
[im 18/54]
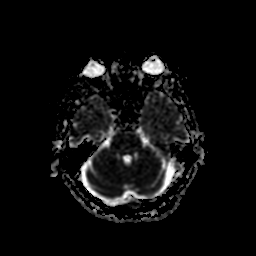
[im 36/54]
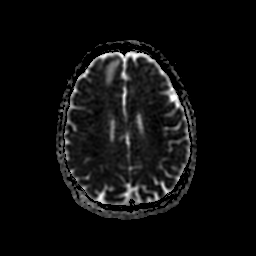
[im 54/54]
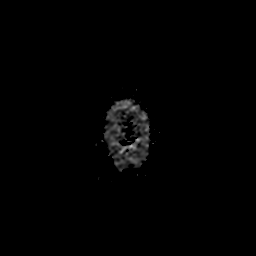

[Series 500: DWI · coronal · 5.0mm · 1.09mm/px · 3 of 37 slices shown (4 of 4)]
[im 1/37]
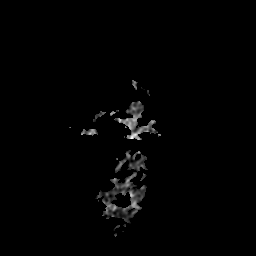
[im 19/37]
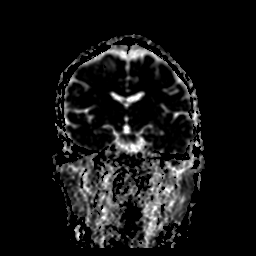
[im 37/37]
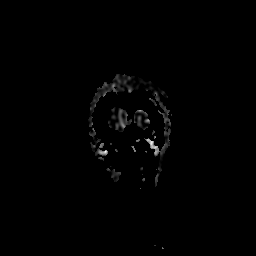

[33 of 48 positions shown; findings below may reference images not displayed]

FINDINGS: A total of 8 hemorrhagic foci are present, compatible with
hemorrhagic metastases. Surrounding T2 changes are evident. There is
also associated enhancement with each of these lesions.

The largest lesion is in the anterior right frontal lobe measuring
12 x 18 x 11 mm.

A 5 mm lesion is present in the left cerebellum.

A 4 mm inferior left temporal lobe lesion is present.

A more lateral 6 mm right frontal lobe lesion is present on image
35.

A 7 mm right parietal lesion is present on image 41 of series 11.

A more superior right frontal lobe lesion measures 8 mm on image 43.

A 6 mm lesion is present in the high right posterior frontal lobe.

A 7 mm high posterior left frontal lobe lesion is present.

There are multiple additional punctate foci of enhancement without
associated hemorrhage, also likely representing metastatic disease.
T2 changes associated with the nonhemorrhagic lesions are less
profound.

A 3 mm right pontine metastasis is present without hemorrhage. The
brainstem and cerebellum are otherwise unremarkable.

Internal auditory canals are within normal limits bilaterally. Flow
is present in the major intracranial arteries. The globes and orbits
are intact.

Polyps or mucous retention cysts are present along the floor of the
maxillary sinuses bilaterally. There is mild mucosal thickening
along the floor of maxillary sinuses as well, left greater than
right. The remaining paranasal sinuses and the mastoid air cells are
clear.

Skullbase is within normal limits. Midline sagittal images are
otherwise unremarkable.
IMPRESSION: 1. Multiple brain metastases as described above.
2. At least 8 of the lesions demonstrate hemorrhage, typical of
renal cell carcinoma metastases.
3. Vasogenic edema associated with the metastases is most profound
about those lesions which exhibit blood products.
